# Patient Record
Sex: Female | Born: 1965 | Race: White | Hispanic: No | Smoking: Former smoker
Health system: Southern US, Community
[De-identification: ages and names within clinical notes are randomized; demographics above are authoritative.]

## PROBLEM LIST (undated history)

## (undated) DIAGNOSIS — Z8052 Family history of malignant neoplasm of bladder: Secondary | ICD-10-CM

## (undated) DIAGNOSIS — Z9189 Other specified personal risk factors, not elsewhere classified: Secondary | ICD-10-CM

## (undated) DIAGNOSIS — C801 Malignant (primary) neoplasm, unspecified: Secondary | ICD-10-CM

## (undated) DIAGNOSIS — N900 Mild vulvar dysplasia: Secondary | ICD-10-CM

## (undated) DIAGNOSIS — F329 Major depressive disorder, single episode, unspecified: Secondary | ICD-10-CM

## (undated) DIAGNOSIS — Z806 Family history of leukemia: Secondary | ICD-10-CM

## (undated) DIAGNOSIS — Z808 Family history of malignant neoplasm of other organs or systems: Secondary | ICD-10-CM

## (undated) DIAGNOSIS — M8580 Other specified disorders of bone density and structure, unspecified site: Secondary | ICD-10-CM

## (undated) DIAGNOSIS — F32A Depression, unspecified: Secondary | ICD-10-CM

## (undated) DIAGNOSIS — Z5189 Encounter for other specified aftercare: Secondary | ICD-10-CM

## (undated) DIAGNOSIS — I10 Essential (primary) hypertension: Secondary | ICD-10-CM

## (undated) DIAGNOSIS — A64 Unspecified sexually transmitted disease: Secondary | ICD-10-CM

## (undated) DIAGNOSIS — Z803 Family history of malignant neoplasm of breast: Secondary | ICD-10-CM

## (undated) DIAGNOSIS — Z8042 Family history of malignant neoplasm of prostate: Secondary | ICD-10-CM

## (undated) DIAGNOSIS — N890 Mild vaginal dysplasia: Secondary | ICD-10-CM

## (undated) DIAGNOSIS — Z91B Personal risk factor of exposure to diethylstilbestrol: Secondary | ICD-10-CM

## (undated) DIAGNOSIS — Z800 Family history of malignant neoplasm of digestive organs: Secondary | ICD-10-CM

## (undated) DIAGNOSIS — F988 Other specified behavioral and emotional disorders with onset usually occurring in childhood and adolescence: Secondary | ICD-10-CM

## (undated) DIAGNOSIS — R011 Cardiac murmur, unspecified: Secondary | ICD-10-CM

## (undated) DIAGNOSIS — Z22321 Carrier or suspected carrier of Methicillin susceptible Staphylococcus aureus: Secondary | ICD-10-CM

## (undated) DIAGNOSIS — N871 Moderate cervical dysplasia: Secondary | ICD-10-CM

## (undated) HISTORY — DX: Mild vaginal dysplasia: N89.0

## (undated) HISTORY — DX: Encounter for other specified aftercare: Z51.89

## (undated) HISTORY — DX: Malignant (primary) neoplasm, unspecified: C80.1

## (undated) HISTORY — DX: Personal risk factor of exposure to diethylstilbestrol: Z91.B

## (undated) HISTORY — DX: Family history of malignant neoplasm of other organs or systems: Z80.8

## (undated) HISTORY — DX: Mild vulvar dysplasia: N90.0

## (undated) HISTORY — DX: Family history of malignant neoplasm of digestive organs: Z80.0

## (undated) HISTORY — DX: Carrier or suspected carrier of methicillin susceptible Staphylococcus aureus: Z22.321

## (undated) HISTORY — DX: Major depressive disorder, single episode, unspecified: F32.9

## (undated) HISTORY — DX: Family history of malignant neoplasm of bladder: Z80.52

## (undated) HISTORY — DX: Moderate cervical dysplasia: N87.1

## (undated) HISTORY — DX: Other specified behavioral and emotional disorders with onset usually occurring in childhood and adolescence: F98.8

## (undated) HISTORY — DX: Depression, unspecified: F32.A

## (undated) HISTORY — PX: WRIST SURGERY: SHX841

## (undated) HISTORY — DX: Family history of leukemia: Z80.6

## (undated) HISTORY — DX: Family history of malignant neoplasm of breast: Z80.3

## (undated) HISTORY — DX: Unspecified sexually transmitted disease: A64

## (undated) HISTORY — PX: BURN TREATMENT: SHX158

## (undated) HISTORY — DX: Family history of malignant neoplasm of prostate: Z80.42

## (undated) HISTORY — DX: Cardiac murmur, unspecified: R01.1

## (undated) HISTORY — DX: Other specified disorders of bone density and structure, unspecified site: M85.80

## (undated) HISTORY — DX: Other specified personal risk factors, not elsewhere classified: Z91.89

## (undated) HISTORY — DX: Essential (primary) hypertension: I10

---
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
## 1993-03-06 HISTORY — PX: LAPAROSCOPY FOR ECTOPIC PREGNANCY: SUR765

## 1997-06-27 ENCOUNTER — Observation Stay (HOSPITAL_COMMUNITY): Admission: AD | Admit: 1997-06-27 | Discharge: 1997-06-28 | Payer: Self-pay | Admitting: Obstetrics and Gynecology

## 1997-07-06 ENCOUNTER — Inpatient Hospital Stay (HOSPITAL_COMMUNITY): Admission: AD | Admit: 1997-07-06 | Discharge: 1997-07-06 | Payer: Self-pay | Admitting: Obstetrics and Gynecology

## 1997-07-08 ENCOUNTER — Encounter: Admission: RE | Admit: 1997-07-08 | Discharge: 1997-10-06 | Payer: Self-pay | Admitting: Gynecology

## 1997-07-25 ENCOUNTER — Inpatient Hospital Stay (HOSPITAL_COMMUNITY): Admission: AD | Admit: 1997-07-25 | Discharge: 1997-07-25 | Payer: Self-pay | Admitting: Gynecology

## 1997-08-27 ENCOUNTER — Inpatient Hospital Stay (HOSPITAL_COMMUNITY): Admission: RE | Admit: 1997-08-27 | Discharge: 1997-08-27 | Payer: Self-pay | Admitting: Obstetrics and Gynecology

## 1997-08-28 ENCOUNTER — Inpatient Hospital Stay (HOSPITAL_COMMUNITY): Admission: AD | Admit: 1997-08-28 | Discharge: 1997-08-28 | Payer: Self-pay | Admitting: Gynecology

## 1997-09-09 ENCOUNTER — Inpatient Hospital Stay (HOSPITAL_COMMUNITY): Admission: RE | Admit: 1997-09-09 | Discharge: 1997-09-12 | Payer: Self-pay | Admitting: Obstetrics and Gynecology

## 1997-10-12 ENCOUNTER — Other Ambulatory Visit: Admission: RE | Admit: 1997-10-12 | Discharge: 1997-10-12 | Payer: Self-pay | Admitting: Obstetrics and Gynecology

## 1998-10-11 ENCOUNTER — Other Ambulatory Visit: Admission: RE | Admit: 1998-10-11 | Discharge: 1998-10-11 | Payer: Self-pay | Admitting: Obstetrics and Gynecology

## 1999-04-07 HISTORY — PX: AUGMENTATION MAMMAPLASTY: SUR837

## 1999-10-18 ENCOUNTER — Other Ambulatory Visit: Admission: RE | Admit: 1999-10-18 | Discharge: 1999-10-18 | Payer: Self-pay | Admitting: Obstetrics and Gynecology

## 2000-10-31 ENCOUNTER — Other Ambulatory Visit: Admission: RE | Admit: 2000-10-31 | Discharge: 2000-10-31 | Payer: Self-pay | Admitting: Obstetrics and Gynecology

## 2001-11-20 ENCOUNTER — Other Ambulatory Visit: Admission: RE | Admit: 2001-11-20 | Discharge: 2001-11-20 | Payer: Self-pay | Admitting: Obstetrics and Gynecology

## 2002-12-02 ENCOUNTER — Other Ambulatory Visit: Admission: RE | Admit: 2002-12-02 | Discharge: 2002-12-02 | Payer: Self-pay | Admitting: Obstetrics and Gynecology

## 2002-12-08 ENCOUNTER — Encounter: Admission: RE | Admit: 2002-12-08 | Discharge: 2002-12-08 | Payer: Self-pay | Admitting: Obstetrics and Gynecology

## 2002-12-08 ENCOUNTER — Encounter: Payer: Self-pay | Admitting: Obstetrics and Gynecology

## 2003-12-08 ENCOUNTER — Other Ambulatory Visit: Admission: RE | Admit: 2003-12-08 | Discharge: 2003-12-08 | Payer: Self-pay | Admitting: Obstetrics and Gynecology

## 2003-12-21 ENCOUNTER — Encounter: Admission: RE | Admit: 2003-12-21 | Discharge: 2003-12-21 | Payer: Self-pay | Admitting: Obstetrics and Gynecology

## 2003-12-21 ENCOUNTER — Encounter (INDEPENDENT_AMBULATORY_CARE_PROVIDER_SITE_OTHER): Payer: Self-pay | Admitting: Diagnostic Radiology

## 2003-12-21 ENCOUNTER — Encounter (INDEPENDENT_AMBULATORY_CARE_PROVIDER_SITE_OTHER): Payer: Self-pay | Admitting: *Deleted

## 2003-12-21 DIAGNOSIS — C801 Malignant (primary) neoplasm, unspecified: Secondary | ICD-10-CM

## 2003-12-21 HISTORY — DX: Malignant (primary) neoplasm, unspecified: C80.1

## 2003-12-25 ENCOUNTER — Encounter (HOSPITAL_COMMUNITY): Admission: RE | Admit: 2003-12-25 | Discharge: 2004-03-24 | Payer: Self-pay | Admitting: General Surgery

## 2004-01-05 ENCOUNTER — Encounter: Admission: RE | Admit: 2004-01-05 | Discharge: 2004-01-05 | Payer: Self-pay | Admitting: General Surgery

## 2004-01-07 ENCOUNTER — Encounter (INDEPENDENT_AMBULATORY_CARE_PROVIDER_SITE_OTHER): Payer: Self-pay | Admitting: General Surgery

## 2004-01-07 ENCOUNTER — Ambulatory Visit (HOSPITAL_COMMUNITY): Admission: RE | Admit: 2004-01-07 | Discharge: 2004-01-07 | Payer: Self-pay | Admitting: General Surgery

## 2004-01-07 ENCOUNTER — Encounter (INDEPENDENT_AMBULATORY_CARE_PROVIDER_SITE_OTHER): Payer: Self-pay | Admitting: *Deleted

## 2004-01-07 ENCOUNTER — Ambulatory Visit (HOSPITAL_BASED_OUTPATIENT_CLINIC_OR_DEPARTMENT_OTHER): Admission: RE | Admit: 2004-01-07 | Discharge: 2004-01-07 | Payer: Self-pay | Admitting: General Surgery

## 2004-01-07 HISTORY — PX: MASTECTOMY, PARTIAL: SHX709

## 2004-01-08 ENCOUNTER — Ambulatory Visit: Payer: Self-pay | Admitting: Oncology

## 2004-01-18 ENCOUNTER — Ambulatory Visit: Admission: RE | Admit: 2004-01-18 | Discharge: 2004-03-11 | Payer: Self-pay | Admitting: *Deleted

## 2004-01-20 ENCOUNTER — Ambulatory Visit (HOSPITAL_COMMUNITY): Admission: RE | Admit: 2004-01-20 | Discharge: 2004-01-20 | Payer: Self-pay | Admitting: Oncology

## 2004-01-25 ENCOUNTER — Ambulatory Visit (HOSPITAL_COMMUNITY): Admission: RE | Admit: 2004-01-25 | Discharge: 2004-01-25 | Payer: Self-pay | Admitting: Oncology

## 2004-01-27 ENCOUNTER — Ambulatory Visit (HOSPITAL_COMMUNITY): Admission: RE | Admit: 2004-01-27 | Discharge: 2004-01-27 | Payer: Self-pay | Admitting: Oncology

## 2004-02-04 ENCOUNTER — Ambulatory Visit (HOSPITAL_BASED_OUTPATIENT_CLINIC_OR_DEPARTMENT_OTHER): Admission: RE | Admit: 2004-02-04 | Discharge: 2004-02-04 | Payer: Self-pay | Admitting: General Surgery

## 2004-02-10 ENCOUNTER — Ambulatory Visit: Payer: Self-pay | Admitting: Oncology

## 2004-03-10 ENCOUNTER — Ambulatory Visit: Payer: Self-pay | Admitting: Oncology

## 2004-04-14 ENCOUNTER — Encounter: Admission: RE | Admit: 2004-04-14 | Discharge: 2004-04-14 | Payer: Self-pay | Admitting: Oncology

## 2004-04-18 ENCOUNTER — Ambulatory Visit (HOSPITAL_BASED_OUTPATIENT_CLINIC_OR_DEPARTMENT_OTHER): Admission: RE | Admit: 2004-04-18 | Discharge: 2004-04-18 | Payer: Self-pay | Admitting: General Surgery

## 2004-04-18 ENCOUNTER — Encounter (INDEPENDENT_AMBULATORY_CARE_PROVIDER_SITE_OTHER): Payer: Self-pay | Admitting: Specialist

## 2004-04-18 ENCOUNTER — Ambulatory Visit (HOSPITAL_COMMUNITY): Admission: RE | Admit: 2004-04-18 | Discharge: 2004-04-18 | Payer: Self-pay | Admitting: General Surgery

## 2004-04-22 ENCOUNTER — Ambulatory Visit: Admission: RE | Admit: 2004-04-22 | Discharge: 2004-06-15 | Payer: Self-pay | Admitting: *Deleted

## 2004-04-28 ENCOUNTER — Ambulatory Visit: Payer: Self-pay | Admitting: Oncology

## 2004-05-09 ENCOUNTER — Ambulatory Visit (HOSPITAL_BASED_OUTPATIENT_CLINIC_OR_DEPARTMENT_OTHER): Admission: RE | Admit: 2004-05-09 | Discharge: 2004-05-09 | Payer: Self-pay | Admitting: General Surgery

## 2004-05-09 ENCOUNTER — Encounter (INDEPENDENT_AMBULATORY_CARE_PROVIDER_SITE_OTHER): Payer: Self-pay | Admitting: *Deleted

## 2004-05-09 ENCOUNTER — Ambulatory Visit (HOSPITAL_COMMUNITY): Admission: RE | Admit: 2004-05-09 | Discharge: 2004-05-09 | Payer: Self-pay | Admitting: General Surgery

## 2004-06-29 ENCOUNTER — Ambulatory Visit: Payer: Self-pay | Admitting: Oncology

## 2004-07-04 HISTORY — PX: MASTECTOMY: SHX3

## 2004-07-14 HISTORY — PX: SIMPLE MASTECTOMY: SHX2409

## 2004-08-24 ENCOUNTER — Ambulatory Visit: Payer: Self-pay | Admitting: Oncology

## 2004-10-18 ENCOUNTER — Ambulatory Visit: Payer: Self-pay | Admitting: Oncology

## 2004-12-05 HISTORY — PX: BREAST RECONSTRUCTION: SHX9

## 2004-12-12 ENCOUNTER — Ambulatory Visit (HOSPITAL_COMMUNITY): Admission: RE | Admit: 2004-12-12 | Discharge: 2004-12-12 | Payer: Self-pay | Admitting: Oncology

## 2004-12-14 ENCOUNTER — Ambulatory Visit: Payer: Self-pay | Admitting: Oncology

## 2005-02-03 ENCOUNTER — Other Ambulatory Visit: Admission: RE | Admit: 2005-02-03 | Discharge: 2005-02-03 | Payer: Self-pay | Admitting: Obstetrics and Gynecology

## 2005-02-08 ENCOUNTER — Ambulatory Visit: Payer: Self-pay | Admitting: Oncology

## 2005-04-05 ENCOUNTER — Ambulatory Visit: Payer: Self-pay | Admitting: Oncology

## 2005-05-31 ENCOUNTER — Ambulatory Visit: Payer: Self-pay | Admitting: Oncology

## 2005-07-21 ENCOUNTER — Ambulatory Visit: Payer: Self-pay | Admitting: Oncology

## 2005-07-25 LAB — COMPREHENSIVE METABOLIC PANEL
AST: 22 U/L (ref 0–37)
Alkaline Phosphatase: 72 U/L (ref 39–117)
BUN: 10 mg/dL (ref 6–23)
Calcium: 9.3 mg/dL (ref 8.4–10.5)
Chloride: 101 mEq/L (ref 96–112)
Creatinine, Ser: 0.7 mg/dL (ref 0.4–1.2)
Glucose, Bld: 103 mg/dL — ABNORMAL HIGH (ref 70–99)

## 2005-07-25 LAB — CBC WITH DIFFERENTIAL/PLATELET
Basophils Absolute: 0 10*3/uL (ref 0.0–0.1)
EOS%: 1.9 % (ref 0.0–7.0)
Eosinophils Absolute: 0.1 10*3/uL (ref 0.0–0.5)
HCT: 35.6 % (ref 34.8–46.6)
HGB: 12.2 g/dL (ref 11.6–15.9)
MCH: 31.4 pg (ref 26.0–34.0)
MCV: 91.2 fL (ref 81.0–101.0)
MONO%: 9.7 % (ref 0.0–13.0)
NEUT%: 58.3 % (ref 39.6–76.8)

## 2005-08-21 ENCOUNTER — Encounter: Admission: RE | Admit: 2005-08-21 | Discharge: 2005-08-21 | Payer: Self-pay | Admitting: Oncology

## 2005-09-18 ENCOUNTER — Ambulatory Visit: Payer: Self-pay | Admitting: Oncology

## 2005-11-14 ENCOUNTER — Ambulatory Visit: Payer: Self-pay | Admitting: Oncology

## 2005-11-15 ENCOUNTER — Ambulatory Visit (HOSPITAL_COMMUNITY): Admission: RE | Admit: 2005-11-15 | Discharge: 2005-11-15 | Payer: Self-pay | Admitting: Oncology

## 2005-11-21 ENCOUNTER — Ambulatory Visit (HOSPITAL_COMMUNITY): Admission: RE | Admit: 2005-11-21 | Discharge: 2005-11-21 | Payer: Self-pay | Admitting: Oncology

## 2006-01-09 ENCOUNTER — Ambulatory Visit: Payer: Self-pay | Admitting: Oncology

## 2006-02-01 LAB — CBC WITH DIFFERENTIAL/PLATELET
BASO%: 1.4 % (ref 0.0–2.0)
EOS%: 1.6 % (ref 0.0–7.0)
LYMPH%: 34 % (ref 14.0–48.0)
MCHC: 35.2 g/dL (ref 32.0–36.0)
MONO#: 0.6 10*3/uL (ref 0.1–0.9)
MONO%: 8.1 % (ref 0.0–13.0)
Platelets: 322 10*3/uL (ref 145–400)
RBC: 4.16 10*6/uL (ref 3.70–5.32)
WBC: 7.4 10*3/uL (ref 3.9–10.0)

## 2006-02-01 LAB — COMPREHENSIVE METABOLIC PANEL
ALT: 16 U/L (ref 0–35)
AST: 16 U/L (ref 0–37)
Alkaline Phosphatase: 81 U/L (ref 39–117)
Sodium: 141 mEq/L (ref 135–145)
Total Bilirubin: 0.3 mg/dL (ref 0.3–1.2)
Total Protein: 7.3 g/dL (ref 6.0–8.3)

## 2006-02-23 ENCOUNTER — Other Ambulatory Visit: Admission: RE | Admit: 2006-02-23 | Discharge: 2006-02-23 | Payer: Self-pay | Admitting: Obstetrics and Gynecology

## 2006-03-03 ENCOUNTER — Ambulatory Visit: Payer: Self-pay | Admitting: Oncology

## 2006-03-06 DIAGNOSIS — N871 Moderate cervical dysplasia: Secondary | ICD-10-CM

## 2006-03-06 HISTORY — DX: Moderate cervical dysplasia: N87.1

## 2006-03-06 HISTORY — PX: COLPOSCOPY: SHX161

## 2006-04-06 HISTORY — PX: CERVICAL BIOPSY  W/ LOOP ELECTRODE EXCISION: SUR135

## 2006-04-16 ENCOUNTER — Encounter: Admission: RE | Admit: 2006-04-16 | Discharge: 2006-04-16 | Payer: Self-pay | Admitting: Oncology

## 2006-04-30 ENCOUNTER — Ambulatory Visit: Payer: Self-pay | Admitting: Oncology

## 2006-06-25 ENCOUNTER — Ambulatory Visit: Payer: Self-pay | Admitting: Oncology

## 2006-07-16 ENCOUNTER — Other Ambulatory Visit: Admission: RE | Admit: 2006-07-16 | Discharge: 2006-07-16 | Payer: Self-pay | Admitting: Obstetrics and Gynecology

## 2006-08-14 ENCOUNTER — Ambulatory Visit: Payer: Self-pay | Admitting: Oncology

## 2006-08-16 LAB — COMPREHENSIVE METABOLIC PANEL
Albumin: 4.5 g/dL (ref 3.5–5.2)
Alkaline Phosphatase: 78 U/L (ref 39–117)
BUN: 11 mg/dL (ref 6–23)
CO2: 27 mEq/L (ref 19–32)
Glucose, Bld: 92 mg/dL (ref 70–99)
Total Bilirubin: 0.6 mg/dL (ref 0.3–1.2)

## 2006-08-16 LAB — CBC WITH DIFFERENTIAL/PLATELET
Basophils Absolute: 0 10*3/uL (ref 0.0–0.1)
Eosinophils Absolute: 0 10*3/uL (ref 0.0–0.5)
HCT: 36.7 % (ref 34.8–46.6)
HGB: 12.7 g/dL (ref 11.6–15.9)
LYMPH%: 23.5 % (ref 14.0–48.0)
MCV: 92.3 fL (ref 81.0–101.0)
MONO%: 8 % (ref 0.0–13.0)
NEUT#: 4.4 10*3/uL (ref 1.5–6.5)
Platelets: 246 10*3/uL (ref 145–400)

## 2006-08-16 LAB — CANCER ANTIGEN 27.29: CA 27.29: 26 U/mL (ref 0–39)

## 2006-08-16 LAB — LACTATE DEHYDROGENASE: LDH: 140 U/L (ref 94–250)

## 2006-09-04 ENCOUNTER — Ambulatory Visit (HOSPITAL_COMMUNITY): Admission: RE | Admit: 2006-09-04 | Discharge: 2006-09-04 | Payer: Self-pay | Admitting: Oncology

## 2006-09-04 ENCOUNTER — Encounter: Admission: RE | Admit: 2006-09-04 | Discharge: 2006-09-04 | Payer: Self-pay | Admitting: Oncology

## 2006-10-16 ENCOUNTER — Ambulatory Visit: Payer: Self-pay | Admitting: Oncology

## 2006-11-30 ENCOUNTER — Other Ambulatory Visit: Admission: RE | Admit: 2006-11-30 | Discharge: 2006-11-30 | Payer: Self-pay | Admitting: Obstetrics and Gynecology

## 2006-12-11 ENCOUNTER — Ambulatory Visit: Payer: Self-pay | Admitting: Oncology

## 2007-01-28 ENCOUNTER — Ambulatory Visit: Payer: Self-pay | Admitting: Oncology

## 2007-02-01 LAB — CBC WITH DIFFERENTIAL/PLATELET
Basophils Absolute: 0 10*3/uL (ref 0.0–0.1)
Eosinophils Absolute: 0.1 10*3/uL (ref 0.0–0.5)
HCT: 38.2 % (ref 34.8–46.6)
HGB: 13.5 g/dL (ref 11.6–15.9)
LYMPH%: 42.1 % (ref 14.0–48.0)
MCHC: 35.4 g/dL (ref 32.0–36.0)
MONO#: 0.5 10*3/uL (ref 0.1–0.9)
NEUT#: 2.4 10*3/uL (ref 1.5–6.5)
NEUT%: 44.7 % (ref 39.6–76.8)
Platelets: 214 10*3/uL (ref 145–400)
WBC: 5.3 10*3/uL (ref 3.9–10.0)
lymph#: 2.2 10*3/uL (ref 0.9–3.3)

## 2007-02-01 LAB — LACTATE DEHYDROGENASE: LDH: 142 U/L (ref 94–250)

## 2007-02-01 LAB — COMPREHENSIVE METABOLIC PANEL
ALT: 17 U/L (ref 0–35)
BUN: 13 mg/dL (ref 6–23)
CO2: 24 mEq/L (ref 19–32)
Calcium: 8.6 mg/dL (ref 8.4–10.5)
Chloride: 106 mEq/L (ref 96–112)
Creatinine, Ser: 0.75 mg/dL (ref 0.40–1.20)
Glucose, Bld: 66 mg/dL — ABNORMAL LOW (ref 70–99)
Total Bilirubin: 0.5 mg/dL (ref 0.3–1.2)

## 2007-02-01 LAB — CANCER ANTIGEN 27.29: CA 27.29: 36 U/mL (ref 0–39)

## 2007-02-07 LAB — VITAMIN D PNL(25-HYDRXY+1,25-DIHY)-BLD: Vit D, 25-Hydroxy: 57 ng/mL (ref 30–89)

## 2007-04-03 ENCOUNTER — Ambulatory Visit: Payer: Self-pay | Admitting: Oncology

## 2007-04-05 ENCOUNTER — Other Ambulatory Visit: Admission: RE | Admit: 2007-04-05 | Discharge: 2007-04-05 | Payer: Self-pay | Admitting: Obstetrics and Gynecology

## 2007-05-27 ENCOUNTER — Other Ambulatory Visit: Admission: RE | Admit: 2007-05-27 | Discharge: 2007-05-27 | Payer: Self-pay | Admitting: Obstetrics and Gynecology

## 2007-05-29 ENCOUNTER — Ambulatory Visit: Payer: Self-pay | Admitting: Oncology

## 2007-07-24 ENCOUNTER — Ambulatory Visit: Payer: Self-pay | Admitting: Oncology

## 2007-09-17 ENCOUNTER — Ambulatory Visit: Payer: Self-pay | Admitting: Oncology

## 2007-11-12 ENCOUNTER — Ambulatory Visit: Payer: Self-pay | Admitting: Oncology

## 2008-01-08 ENCOUNTER — Ambulatory Visit: Payer: Self-pay | Admitting: Oncology

## 2008-02-10 LAB — CBC WITH DIFFERENTIAL/PLATELET
BASO%: 0.5 % (ref 0.0–2.0)
HCT: 38 % (ref 34.8–46.6)
LYMPH%: 39.7 % (ref 14.0–48.0)
MCHC: 34.8 g/dL (ref 32.0–36.0)
MCV: 91.3 fL (ref 81.0–101.0)
MONO#: 0.5 10*3/uL (ref 0.1–0.9)
MONO%: 9.2 % (ref 0.0–13.0)
NEUT%: 49.2 % (ref 39.6–76.8)
Platelets: 226 10*3/uL (ref 145–400)
WBC: 5 10*3/uL (ref 3.9–10.0)

## 2008-02-11 LAB — COMPREHENSIVE METABOLIC PANEL
ALT: 16 U/L (ref 0–35)
Alkaline Phosphatase: 76 U/L (ref 39–117)
CO2: 25 mEq/L (ref 19–32)
Creatinine, Ser: 0.9 mg/dL (ref 0.40–1.20)
Glucose, Bld: 71 mg/dL (ref 70–99)
Total Bilirubin: 0.4 mg/dL (ref 0.3–1.2)

## 2008-02-11 LAB — CANCER ANTIGEN 27.29: CA 27.29: 35 U/mL (ref 0–39)

## 2008-02-11 LAB — LACTATE DEHYDROGENASE: LDH: 120 U/L (ref 94–250)

## 2008-03-03 ENCOUNTER — Ambulatory Visit: Payer: Self-pay | Admitting: Oncology

## 2008-03-16 ENCOUNTER — Ambulatory Visit (HOSPITAL_COMMUNITY): Admission: RE | Admit: 2008-03-16 | Discharge: 2008-03-16 | Payer: Self-pay | Admitting: Oncology

## 2008-04-29 ENCOUNTER — Ambulatory Visit: Payer: Self-pay | Admitting: Oncology

## 2008-06-15 ENCOUNTER — Other Ambulatory Visit: Admission: RE | Admit: 2008-06-15 | Discharge: 2008-06-15 | Payer: Self-pay | Admitting: Obstetrics and Gynecology

## 2008-06-25 ENCOUNTER — Ambulatory Visit: Payer: Self-pay | Admitting: Oncology

## 2008-08-20 ENCOUNTER — Ambulatory Visit: Payer: Self-pay | Admitting: Oncology

## 2008-08-24 LAB — CBC WITH DIFFERENTIAL/PLATELET
BASO%: 0.5 % (ref 0.0–2.0)
EOS%: 1.6 % (ref 0.0–7.0)
HCT: 38.2 % (ref 34.8–46.6)
MCH: 33.4 pg (ref 25.1–34.0)
MCHC: 35.9 g/dL (ref 31.5–36.0)
MONO#: 0.5 10*3/uL (ref 0.1–0.9)
RDW: 12.4 % (ref 11.2–14.5)
WBC: 5.3 10*3/uL (ref 3.9–10.3)
lymph#: 2.1 10*3/uL (ref 0.9–3.3)

## 2008-08-25 LAB — COMPREHENSIVE METABOLIC PANEL
ALT: 14 U/L (ref 0–35)
AST: 17 U/L (ref 0–37)
Albumin: 4.4 g/dL (ref 3.5–5.2)
CO2: 27 mEq/L (ref 19–32)
Calcium: 9.3 mg/dL (ref 8.4–10.5)
Chloride: 105 mEq/L (ref 96–112)
Potassium: 3.8 mEq/L (ref 3.5–5.3)
Sodium: 142 mEq/L (ref 135–145)
Total Protein: 7 g/dL (ref 6.0–8.3)

## 2008-08-25 LAB — LACTATE DEHYDROGENASE: LDH: 128 U/L (ref 94–250)

## 2008-09-23 ENCOUNTER — Ambulatory Visit (HOSPITAL_COMMUNITY): Admission: RE | Admit: 2008-09-23 | Discharge: 2008-09-23 | Payer: Self-pay | Admitting: Oncology

## 2008-09-23 ENCOUNTER — Ambulatory Visit: Payer: Self-pay | Admitting: Oncology

## 2008-10-22 ENCOUNTER — Ambulatory Visit: Payer: Self-pay | Admitting: Oncology

## 2008-12-15 ENCOUNTER — Ambulatory Visit: Payer: Self-pay | Admitting: Oncology

## 2009-02-03 ENCOUNTER — Ambulatory Visit: Payer: Self-pay | Admitting: Oncology

## 2009-02-05 LAB — CBC WITH DIFFERENTIAL/PLATELET
Basophils Absolute: 0.1 10*3/uL (ref 0.0–0.1)
EOS%: 0.6 % (ref 0.0–7.0)
Eosinophils Absolute: 0.1 10*3/uL (ref 0.0–0.5)
HCT: 40.4 % (ref 34.8–46.6)
HGB: 13.6 g/dL (ref 11.6–15.9)
MCH: 31.3 pg (ref 25.1–34.0)
MCV: 92.9 fL (ref 79.5–101.0)
MONO%: 6.6 % (ref 0.0–14.0)
NEUT#: 8.2 10*3/uL — ABNORMAL HIGH (ref 1.5–6.5)
NEUT%: 75.9 % (ref 38.4–76.8)

## 2009-02-06 LAB — COMPREHENSIVE METABOLIC PANEL
AST: 15 U/L (ref 0–37)
Albumin: 4.8 g/dL (ref 3.5–5.2)
Alkaline Phosphatase: 80 U/L (ref 39–117)
BUN: 11 mg/dL (ref 6–23)
Calcium: 9.5 mg/dL (ref 8.4–10.5)
Chloride: 104 mEq/L (ref 96–112)
Creatinine, Ser: 0.78 mg/dL (ref 0.40–1.20)
Glucose, Bld: 90 mg/dL (ref 70–99)
Potassium: 3.8 mEq/L (ref 3.5–5.3)

## 2009-02-06 LAB — CANCER ANTIGEN 27.29: CA 27.29: 26 U/mL (ref 0–39)

## 2009-02-06 LAB — VITAMIN D 25 HYDROXY (VIT D DEFICIENCY, FRACTURES): Vit D, 25-Hydroxy: 47 ng/mL (ref 30–89)

## 2009-03-03 ENCOUNTER — Encounter: Admission: RE | Admit: 2009-03-03 | Discharge: 2009-03-03 | Payer: Self-pay | Admitting: Oncology

## 2009-07-06 ENCOUNTER — Ambulatory Visit: Payer: Self-pay | Admitting: Oncology

## 2009-08-05 ENCOUNTER — Ambulatory Visit: Payer: Self-pay | Admitting: Oncology

## 2009-08-05 LAB — CBC WITH DIFFERENTIAL/PLATELET
BASO%: 0.6 % (ref 0.0–2.0)
Eosinophils Absolute: 0.1 10*3/uL (ref 0.0–0.5)
LYMPH%: 29 % (ref 14.0–49.7)
MONO#: 0.6 10*3/uL (ref 0.1–0.9)
NEUT#: 3.7 10*3/uL (ref 1.5–6.5)
Platelets: 268 10*3/uL (ref 145–400)
RBC: 3.97 10*6/uL (ref 3.70–5.45)
WBC: 6.3 10*3/uL (ref 3.9–10.3)
lymph#: 1.8 10*3/uL (ref 0.9–3.3)

## 2009-08-05 LAB — COMPREHENSIVE METABOLIC PANEL
ALT: 16 U/L (ref 0–35)
Albumin: 4.4 g/dL (ref 3.5–5.2)
CO2: 24 mEq/L (ref 19–32)
Calcium: 9.3 mg/dL (ref 8.4–10.5)
Chloride: 103 mEq/L (ref 96–112)
Glucose, Bld: 54 mg/dL — ABNORMAL LOW (ref 70–99)
Potassium: 3.9 mEq/L (ref 3.5–5.3)
Sodium: 139 mEq/L (ref 135–145)
Total Bilirubin: 0.4 mg/dL (ref 0.3–1.2)
Total Protein: 6.8 g/dL (ref 6.0–8.3)

## 2009-08-05 LAB — LACTATE DEHYDROGENASE: LDH: 111 U/L (ref 94–250)

## 2009-08-05 LAB — CANCER ANTIGEN 27.29: CA 27.29: 32 U/mL (ref 0–39)

## 2009-10-18 ENCOUNTER — Ambulatory Visit (HOSPITAL_COMMUNITY): Admission: RE | Admit: 2009-10-18 | Discharge: 2009-10-18 | Payer: Self-pay | Admitting: Oncology

## 2010-02-18 ENCOUNTER — Ambulatory Visit: Payer: Self-pay | Admitting: Oncology

## 2010-03-17 LAB — COMPREHENSIVE METABOLIC PANEL
ALT: 17 U/L (ref 0–35)
AST: 26 U/L (ref 0–37)
Albumin: 4.9 g/dL (ref 3.5–5.2)
Alkaline Phosphatase: 63 U/L (ref 39–117)
BUN: 19 mg/dL (ref 6–23)
CO2: 27 mEq/L (ref 19–32)
Calcium: 9.4 mg/dL (ref 8.4–10.5)
Chloride: 102 mEq/L (ref 96–112)
Creatinine, Ser: 0.82 mg/dL (ref 0.40–1.20)
Glucose, Bld: 85 mg/dL (ref 70–99)
Potassium: 3.8 mEq/L (ref 3.5–5.3)
Sodium: 138 mEq/L (ref 135–145)
Total Bilirubin: 0.5 mg/dL (ref 0.3–1.2)
Total Protein: 7 g/dL (ref 6.0–8.3)

## 2010-03-17 LAB — CBC WITH DIFFERENTIAL/PLATELET
BASO%: 0.5 % (ref 0.0–2.0)
Basophils Absolute: 0 10*3/uL (ref 0.0–0.1)
EOS%: 0.7 % (ref 0.0–7.0)
Eosinophils Absolute: 0.1 10*3/uL (ref 0.0–0.5)
HCT: 38.3 % (ref 34.8–46.6)
HGB: 13.2 g/dL (ref 11.6–15.9)
LYMPH%: 25 % (ref 14.0–49.7)
MCH: 31.7 pg (ref 25.1–34.0)
MCHC: 34.5 g/dL (ref 31.5–36.0)
MCV: 92.1 fL (ref 79.5–101.0)
MONO#: 0.6 10*3/uL (ref 0.1–0.9)
MONO%: 6.8 % (ref 0.0–14.0)
NEUT#: 5.5 10*3/uL (ref 1.5–6.5)
NEUT%: 67 % (ref 38.4–76.8)
Platelets: 293 10*3/uL (ref 145–400)
RBC: 4.16 10*6/uL (ref 3.70–5.45)
RDW: 11.8 % (ref 11.2–14.5)
WBC: 8.2 10*3/uL (ref 3.9–10.3)
lymph#: 2 10*3/uL (ref 0.9–3.3)

## 2010-03-17 LAB — VITAMIN D 25 HYDROXY (VIT D DEFICIENCY, FRACTURES): Vit D, 25-Hydroxy: 48 ng/mL (ref 30–89)

## 2010-03-26 ENCOUNTER — Encounter: Payer: Self-pay | Admitting: Obstetrics and Gynecology

## 2010-03-27 ENCOUNTER — Encounter: Payer: Self-pay | Admitting: Oncology

## 2010-03-28 ENCOUNTER — Encounter: Payer: Self-pay | Admitting: Oncology

## 2010-04-21 ENCOUNTER — Other Ambulatory Visit: Payer: Self-pay | Admitting: Oncology

## 2010-04-21 DIAGNOSIS — Z780 Asymptomatic menopausal state: Secondary | ICD-10-CM

## 2010-04-21 DIAGNOSIS — Z79899 Other long term (current) drug therapy: Secondary | ICD-10-CM

## 2010-07-22 NOTE — Op Note (Signed)
Regina Schultz, Regina Schultz                  ACCOUNT NO.:  [id number]   MEDICAL RECORD NO.:  [id number]          PATIENT TYPE:  AMB   LOCATION:  [facility]                          FACILITY:  [facility]   PHYSICIAN:  Rose Phi. Young, M.D.   DATE OF BIRTH:  [birth year]   DATE OF PROCEDURE:  05/09/2004  DATE OF DISCHARGE:                                 OPERATIVE REPORT   PREOPERATIVE DIAGNOSIS:  Carcinoma of the left breast.   POSTOPERATIVE DIAGNOSIS:  Carcinoma of the left breast.   PROCEDURE:  Reexcision of superior margin of lumpectomy site.   SURGEON:  Dr. Francina Ames.   ANESTHESIA:  General.   DESCRIPTION OF PROCEDURE:  After suitable general anesthesia was induced,  the patient was placed in the supine position with the arms on arm board,  and left breast prepped and draped in the usual fashion.  I marked the  previous lumpectomy site which was at the 12 o'clock position on the left  breast and incised this, and opened up the site. There was no hematoma or  seroma.  Breast tissue was very thin, and the pectoralis major muscle was  just under this with the implant deep to that. I then excised the superior  margin. Hemostasis was obtained with the cautery.   I then infiltrated the incision with 0.25% Marcaine.  I closed it with  interrupted 4-0 nylon mattress sutures.  Dressing applied. The patient was  transferred to the recovery room in satisfactory condition, having tolerated  the procedure well.      PRY/MEDQ  D:  05/09/2004  T:  05/09/2004  Job:  [id number]

## 2010-09-22 ENCOUNTER — Other Ambulatory Visit: Payer: Self-pay | Admitting: Oncology

## 2010-09-22 DIAGNOSIS — Z9013 Acquired absence of bilateral breasts and nipples: Secondary | ICD-10-CM

## 2010-09-22 DIAGNOSIS — Z853 Personal history of malignant neoplasm of breast: Secondary | ICD-10-CM

## 2010-10-19 ENCOUNTER — Other Ambulatory Visit (HOSPITAL_COMMUNITY): Payer: Self-pay

## 2010-11-04 ENCOUNTER — Ambulatory Visit (HOSPITAL_COMMUNITY)
Admission: RE | Admit: 2010-11-04 | Discharge: 2010-11-04 | Disposition: A | Discharge status: Home / Self Care | Payer: BC Managed Care – PPO | Source: Ambulatory Visit | Attending: Oncology | Admitting: Oncology

## 2010-11-04 DIAGNOSIS — Z9010 Acquired absence of unspecified breast and nipple: Secondary | ICD-10-CM | POA: Insufficient documentation

## 2010-11-04 DIAGNOSIS — Z853 Personal history of malignant neoplasm of breast: Secondary | ICD-10-CM

## 2010-11-04 DIAGNOSIS — Z978 Presence of other specified devices: Secondary | ICD-10-CM | POA: Insufficient documentation

## 2010-11-04 DIAGNOSIS — Z9013 Acquired absence of bilateral breasts and nipples: Secondary | ICD-10-CM

## 2010-11-04 MED ORDER — GADOBENATE DIMEGLUMINE 529 MG/ML IV SOLN
15.0000 mL | Freq: Once | INTRAVENOUS | Status: AC | PRN
  Administered 2010-11-04: 15 mL via INTRAVENOUS

## 2010-12-21 ENCOUNTER — Encounter: Payer: Self-pay | Admitting: *Deleted

## 2011-01-17 ENCOUNTER — Telehealth: Payer: Self-pay | Admitting: *Deleted

## 2011-01-17 NOTE — Telephone Encounter (Signed)
It is ok

## 2011-01-17 NOTE — Telephone Encounter (Signed)
Rec. Call from pt needing to R/S her appointment. Pt states that the schedulers are going to give her a Feb. Appt. Pt is inquiring if" it is ok to wait 3 months"  before following up with MD.

## 2011-01-18 ENCOUNTER — Telehealth: Payer: Self-pay | Admitting: Oncology

## 2011-01-18 NOTE — Telephone Encounter (Signed)
pt called and stated that she need to r/s appt to [id number] asked dr. Donnie Coffin and he approved called pt back lmovm to rtn to r/s appts on nov2012 to [phone]

## 2011-01-20 ENCOUNTER — Other Ambulatory Visit: Payer: BC Managed Care – PPO | Admitting: Lab

## 2011-01-23 ENCOUNTER — Telehealth: Payer: Self-pay | Admitting: *Deleted

## 2011-01-23 NOTE — Telephone Encounter (Signed)
Patient needs follow-up appointment by end of December - cannot be delayed until February 2013 per previous Telephone entry, due to research protocol requirements. Patient states she can come most days between 9am and 1pm, beginning next week through December. Needs lab and MD scheduled for same day.

## 2011-01-23 NOTE — Telephone Encounter (Signed)
Erroneous Encounter - Disregard

## 2011-01-23 NOTE — Telephone Encounter (Signed)
Patient appointments rescheduled to 02/16/2011 at 1:00 pm (lab and MD) by Maple Mirza. Left message on patient's cell phone voice mail regarding these appointments.

## 2011-01-27 ENCOUNTER — Ambulatory Visit: Payer: BC Managed Care – PPO | Admitting: Oncology

## 2011-02-16 ENCOUNTER — Other Ambulatory Visit: Payer: Self-pay | Admitting: Oncology

## 2011-02-16 ENCOUNTER — Telehealth: Payer: Self-pay | Admitting: *Deleted

## 2011-02-16 ENCOUNTER — Other Ambulatory Visit (HOSPITAL_BASED_OUTPATIENT_CLINIC_OR_DEPARTMENT_OTHER): Payer: BC Managed Care – PPO | Admitting: Lab

## 2011-02-16 ENCOUNTER — Ambulatory Visit (HOSPITAL_BASED_OUTPATIENT_CLINIC_OR_DEPARTMENT_OTHER): Payer: BC Managed Care – PPO | Admitting: Oncology

## 2011-02-16 VITALS — BP 113/73 | HR 52 | Temp 98.3°F | Ht 63.5 in | Wt 133.7 lb

## 2011-02-16 DIAGNOSIS — Z170 Estrogen receptor positive status [ER+]: Secondary | ICD-10-CM

## 2011-02-16 DIAGNOSIS — C50419 Malignant neoplasm of upper-outer quadrant of unspecified female breast: Secondary | ICD-10-CM

## 2011-02-16 DIAGNOSIS — C50919 Malignant neoplasm of unspecified site of unspecified female breast: Secondary | ICD-10-CM

## 2011-02-16 LAB — CBC WITH DIFFERENTIAL/PLATELET
BASO%: 0.7 % (ref 0.0–2.0)
HCT: 38 % (ref 34.8–46.6)
LYMPH%: 25.8 % (ref 14.0–49.7)
MCH: 32.2 pg (ref 25.1–34.0)
MCHC: 34.4 g/dL (ref 31.5–36.0)
MCV: 93.7 fL (ref 79.5–101.0)
MONO#: 0.5 10*3/uL (ref 0.1–0.9)
MONO%: 6.5 % (ref 0.0–14.0)
NEUT%: 65.8 % (ref 38.4–76.8)
Platelets: 249 10*3/uL (ref 145–400)
RBC: 4.05 10*6/uL (ref 3.70–5.45)

## 2011-02-16 LAB — COMPREHENSIVE METABOLIC PANEL
ALT: 12 U/L (ref 0–35)
Alkaline Phosphatase: 59 U/L (ref 39–117)
CO2: 27 mEq/L (ref 19–32)
Creatinine, Ser: 0.86 mg/dL (ref 0.50–1.10)
Sodium: 138 mEq/L (ref 135–145)
Total Bilirubin: 0.3 mg/dL (ref 0.3–1.2)
Total Protein: 6.7 g/dL (ref 6.0–8.3)

## 2011-02-16 LAB — VITAMIN D 25 HYDROXY (VIT D DEFICIENCY, FRACTURES): Vit D, 25-Hydroxy: 52 ng/mL (ref 30–89)

## 2011-02-16 MED ORDER — TAMOXIFEN CITRATE 20 MG PO TABS: 20.0000 mg | ORAL_TABLET | Freq: Every day | ORAL | Status: AC

## 2011-02-16 MED ORDER — TAMOXIFEN CITRATE 20 MG PO TABS: 20.0000 mg | ORAL_TABLET | Freq: Every day | ORAL | Status: DC

## 2011-02-16 NOTE — Telephone Encounter (Signed)
gave patient appointment for 02-2012 printed out calendar and gave to the patient 

## 2011-02-16 NOTE — Progress Notes (Signed)
Hematology and Oncology Follow Up Visit  Regina Schultz [id number] 12-28-1965 45 y.o. 02/16/2011 2:13 PM PCP  Principle Diagnosis: 45 yo woman with hx of er/pr+ breast cancer, s/p bilateral mrm 11/05, s/p reconstruction 2006. S/p AC x4 , on TEXT study randomized to triptolyne, completed 2010, Tamoxifen x 5 yrs completed in 2011.  Interim History:  There have been no intercurrent illness, hospitalizations or medication changes.  Medications: I have reviewed the patient's current medications.  Allergies: No Known Allergies  Past Medical History, Surgical history, Social history, and Family History were reviewed and updated.  Review of Systems: Constitutional:  Negative for fever, chills, night sweats, anorexia, weight loss, pain. Cardiovascular: no chest pain or dyspnea on exertion Respiratory: no cough, shortness of breath, or wheezing Neurological: no TIA or stroke symptoms Dermatological: negative ENT: negative Skin Gastrointestinal: no abdominal pain, change in bowel habits, or black or bloody stools Genito-Urinary: no dysuria, trouble voiding, or hematuria Hematological and Lymphatic: negative Breast: negative Musculoskeletal: negative Remaining ROS negative.  Physical Exam: Blood pressure 113/73, pulse 52, temperature 98.3 F (36.8 C), height 5' 3.5" (1.613 m), weight 133 lb 11.2 oz (60.646 kg). ECOG: 0 General appearance: alert, cooperative and appears stated age Head: Normocephalic, without obvious abnormality, atraumatic Neck: no adenopathy, no carotid bruit, no JVD, supple, symmetrical, trachea midline and thyroid not enlarged, symmetric, no tenderness/mass/nodules Lymph nodes: Cervical, supraclavicular, and axillary nodes normal. Cardiac : regular rate and rhythm, no murmurs or gallops Pulmonary:clear to auscultation bilaterally and normal percussion bilaterally Breasts: negative, s/p bilateral implants,  Abdomen:soft, non-tender; bowel sounds normal; no masses,  no  organomegaly Extremities negative Neuro: alert, oriented, normal speech, no focal findings or movement disorder noted  Lab Results: Lab Results  Component Value Date   WBC 7.4 02/16/2011   HGB 13.1 02/16/2011   HCT 38.0 02/16/2011   MCV 93.7 02/16/2011   PLT 249 02/16/2011     Chemistry      Component Value Date/Time   NA 138 03/17/2010 1422   K 3.8 03/17/2010 1422   CL 102 03/17/2010 1422   CO2 27 03/17/2010 1422   BUN 19 03/17/2010 1422   CREATININE 0.82 03/17/2010 1422      Component Value Date/Time   CALCIUM 9.4 03/17/2010 1422   ALKPHOS 63 03/17/2010 1422   AST 26 03/17/2010 1422   ALT 17 03/17/2010 1422   BILITOT 0.5 03/17/2010 1422       Radiological Studies: chest X-ray n/a Mammogram n/a Bone density Due 12/12  Impression and Plan: We had along discussin re- extended tamoxifen therapy per recent data from [facility]. She is at increased risk given resumption of menses, young age and + family history. She has agreed to take the tamoxifen. I will see her in 1 yr with repeat dexa this month. Of note she has not been taking her fosamax consistently.  More than 50% of the visit was spent in patient-related counselling   Pierce Crane, MD 12/13/20122:13 PM

## 2011-03-14 ENCOUNTER — Ambulatory Visit
Admission: RE | Admit: 2011-03-14 | Discharge: 2011-03-14 | Disposition: A | Discharge status: Home / Self Care | Payer: BC Managed Care – PPO | Source: Ambulatory Visit | Attending: Oncology | Admitting: Oncology

## 2011-03-14 DIAGNOSIS — Z79899 Other long term (current) drug therapy: Secondary | ICD-10-CM

## 2011-03-14 DIAGNOSIS — Z780 Asymptomatic menopausal state: Secondary | ICD-10-CM

## 2011-04-18 ENCOUNTER — Telehealth: Payer: Self-pay | Admitting: *Deleted

## 2011-04-18 NOTE — Telephone Encounter (Signed)
Discussed Bone Density results with Dr. Donnie Coffin.  Called pt.  She does not need to substitute anything for the fosamax, which she has taken sporadically.  Her bone density is within the expected range for age and is actually a little better in the spine.  She does need to take her calcium and vitamin D and any exercises that she is currently doing.  She appreciated the call back.

## 2011-07-17 ENCOUNTER — Telehealth: Payer: Self-pay | Admitting: *Deleted

## 2011-07-17 NOTE — Telephone Encounter (Signed)
per staff message changed patient appointment to the week of Nov.11th left voice message informing the patient of the new date and time

## 2011-07-26 ENCOUNTER — Telehealth: Payer: Self-pay | Admitting: *Deleted

## 2011-07-26 NOTE — Telephone Encounter (Signed)
Confirmed 09/14/11 genetics appt w/ pt.

## 2011-09-13 ENCOUNTER — Telehealth: Payer: Self-pay | Admitting: *Deleted

## 2011-09-13 NOTE — Telephone Encounter (Signed)
Patient called stating that she is going out of town and needs to reschedule her genetic appt.  Confirmed 10/12/11 appt w/ pt.

## 2011-09-14 ENCOUNTER — Other Ambulatory Visit: Payer: BC Managed Care – PPO | Admitting: Lab

## 2011-09-14 ENCOUNTER — Encounter: Payer: BC Managed Care – PPO | Admitting: Genetic Counselor

## 2011-10-12 ENCOUNTER — Encounter: Payer: Self-pay | Admitting: Genetic Counselor

## 2011-10-12 ENCOUNTER — Other Ambulatory Visit: Payer: BC Managed Care – PPO | Admitting: Lab

## 2011-10-12 ENCOUNTER — Ambulatory Visit (HOSPITAL_BASED_OUTPATIENT_CLINIC_OR_DEPARTMENT_OTHER): Payer: BC Managed Care – PPO | Admitting: Genetic Counselor

## 2011-10-12 ENCOUNTER — Encounter: Payer: Self-pay | Admitting: *Deleted

## 2011-10-12 DIAGNOSIS — Z8052 Family history of malignant neoplasm of bladder: Secondary | ICD-10-CM

## 2011-10-12 DIAGNOSIS — Z808 Family history of malignant neoplasm of other organs or systems: Secondary | ICD-10-CM

## 2011-10-12 DIAGNOSIS — C50919 Malignant neoplasm of unspecified site of unspecified female breast: Secondary | ICD-10-CM

## 2011-10-12 DIAGNOSIS — Z803 Family history of malignant neoplasm of breast: Secondary | ICD-10-CM
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
## 2011-10-12 NOTE — Progress Notes (Signed)
Patient in to clinic today for appointment with genetics counselor. Spoke with patient briefly in lobby and gave patient appointment calendar for next medical oncology visit with lab. Noted that appointments were moved from December to November to align with annual study visits.

## 2011-11-13 ENCOUNTER — Telehealth: Payer: Self-pay | Admitting: Genetic Counselor

## 2011-11-13 NOTE — Telephone Encounter (Signed)
Revealed negative BRCAPlus testing.  BreastNext is proceeding and will take 16 weeks.

## 2011-11-15 ENCOUNTER — Encounter: Payer: Self-pay | Admitting: Genetic Counselor

## 2012-01-12 ENCOUNTER — Telehealth: Payer: Self-pay | Admitting: Genetic Counselor

## 2012-01-12 ENCOUNTER — Encounter: Payer: Self-pay | Admitting: Genetic Counselor

## 2012-01-12 NOTE — Telephone Encounter (Signed)
Revealed Negative BreastNext test results.

## 2012-01-15 ENCOUNTER — Other Ambulatory Visit: Payer: Self-pay | Admitting: *Deleted

## 2012-01-15 ENCOUNTER — Telehealth: Payer: Self-pay | Admitting: *Deleted

## 2012-01-15 DIAGNOSIS — C50919 Malignant neoplasm of unspecified site of unspecified female breast: Secondary | ICD-10-CM

## 2012-01-15 NOTE — Telephone Encounter (Signed)
Left voice message to inform the patient of the new date and time on 01-29-2012 starting at 9:30am

## 2012-01-16 ENCOUNTER — Other Ambulatory Visit: Payer: BC Managed Care – PPO | Admitting: Lab

## 2012-01-16 ENCOUNTER — Ambulatory Visit: Payer: BC Managed Care – PPO | Admitting: Oncology

## 2012-01-29 ENCOUNTER — Ambulatory Visit (HOSPITAL_BASED_OUTPATIENT_CLINIC_OR_DEPARTMENT_OTHER): Payer: BC Managed Care – PPO | Admitting: Oncology

## 2012-01-29 ENCOUNTER — Other Ambulatory Visit (HOSPITAL_BASED_OUTPATIENT_CLINIC_OR_DEPARTMENT_OTHER): Payer: BC Managed Care – PPO | Admitting: Lab

## 2012-01-29 ENCOUNTER — Telehealth: Payer: Self-pay | Admitting: *Deleted

## 2012-01-29 ENCOUNTER — Encounter: Payer: Self-pay | Admitting: *Deleted

## 2012-01-29 VITALS — BP 112/72 | HR 49 | Temp 98.0°F | Resp 20 | Ht 63.5 in | Wt 129.6 lb

## 2012-01-29 DIAGNOSIS — C50919 Malignant neoplasm of unspecified site of unspecified female breast: Secondary | ICD-10-CM

## 2012-01-29 DIAGNOSIS — Z170 Estrogen receptor positive status [ER+]: Secondary | ICD-10-CM

## 2012-01-29 LAB — CBC WITH DIFFERENTIAL/PLATELET
Basophils Absolute: 0.1 10*3/uL (ref 0.0–0.1)
HCT: 39.3 % (ref 34.8–46.6)
HGB: 13.3 g/dL (ref 11.6–15.9)
LYMPH%: 25.3 % (ref 14.0–49.7)
MONO#: 0.6 10*3/uL (ref 0.1–0.9)
NEUT%: 64.4 % (ref 38.4–76.8)
Platelets: 246 10*3/uL (ref 145–400)
WBC: 6.6 10*3/uL (ref 3.9–10.3)
lymph#: 1.7 10*3/uL (ref 0.9–3.3)

## 2012-01-29 LAB — COMPREHENSIVE METABOLIC PANEL (CC13)
ALT: 25 U/L (ref 0–55)
Albumin: 4 g/dL (ref 3.5–5.0)
CO2: 29 mEq/L (ref 22–29)
Calcium: 9.1 mg/dL (ref 8.4–10.4)
Chloride: 102 mEq/L (ref 98–107)
Glucose: 92 mg/dl (ref 70–99)
Potassium: 4 mEq/L (ref 3.5–5.1)
Sodium: 139 mEq/L (ref 136–145)
Total Protein: 6.9 g/dL (ref 6.4–8.3)

## 2012-01-29 LAB — LACTATE DEHYDROGENASE (CC13): LDH: 143 U/L (ref 125–245)

## 2012-01-29 NOTE — Telephone Encounter (Signed)
Gave patient appointment for 01-2013

## 2012-01-29 NOTE — Progress Notes (Signed)
Hematology and Oncology Follow Up Visit  Regina Schultz [id number] 1966/03/04 46 y.o. 01/29/2012 11:10 AM PCP  Principle Diagnosis: 46 yo woman with hx of er/pr+ breast cancer, s/p bilateral mrm 11/05, s/p reconstruction 2006. S/p AC x4 , on TEXT study randomized to triptolyne, completed 2010, Tamoxifen x 5 yrs completed in 2011.Currently back on tamoxifen  Interim History:  There have been no intercurrent illness, hospitalizations or medication changes.she did have a period in July but none  Since. She has done well. She has had genetic testing with an extended panel, all of which were negative.she had an MRI of her chest in 2012, but not this year.  Medications: I have reviewed the patient's current medications.  Allergies: No Known Allergies  Past Medical History, Surgical history, Social history, and Family History were reviewed and updated.  Review of Systems: Constitutional:  Negative for fever, chills, night sweats, anorexia, weight loss, pain. Cardiovascular: no chest pain or dyspnea on exertion Respiratory: no cough, shortness of breath, or wheezing Neurological: no TIA or stroke symptoms Dermatological: negative ENT: negative Skin Gastrointestinal: no abdominal pain, change in bowel habits, or black or bloody stools Genito-Urinary: no dysuria, trouble voiding, or hematuria Hematological and Lymphatic: negative Breast: negative Musculoskeletal: negative Remaining ROS negative.  Physical Exam: Blood pressure 112/72, pulse 49, temperature 98 F (36.7 C), resp. rate 20, height 5' 3.5" (1.613 m), weight 129 lb 9.6 oz (58.786 kg). ECOG: 0 General appearance: alert, cooperative and appears stated age Head: Normocephalic, without obvious abnormality, atraumatic Neck: no adenopathy, no carotid bruit, no JVD, supple, symmetrical, trachea midline and thyroid not enlarged, symmetric, no tenderness/mass/nodules Lymph nodes: Cervical, supraclavicular, and axillary nodes  normal. Cardiac : regular rate and rhythm, no murmurs or gallops Pulmonary:clear to auscultation bilaterally and normal percussion bilaterally Breasts: negative, s/p bilateral implants, no evidence of recurrence. Abdomen:soft, non-tender; bowel sounds normal; no masses,  no organomegaly Extremities negative Neuro: alert, oriented, normal speech, no focal findings or movement disorder noted  Lab Results: Lab Results  Component Value Date   WBC 6.6 01/29/2012   HGB 13.3 01/29/2012   HCT 39.3 01/29/2012   MCV 97.0 01/29/2012   PLT 246 01/29/2012     Chemistry      Component Value Date/Time   NA 139 01/29/2012 0944   NA 138 02/16/2011 1301   NA 138 02/16/2011 1301   K 4.0 01/29/2012 0944   K 4.0 02/16/2011 1301   K 4.0 02/16/2011 1301   CL 102 01/29/2012 0944   CL 102 02/16/2011 1301   CL 102 02/16/2011 1301   CO2 29 01/29/2012 0944   CO2 27 02/16/2011 1301   CO2 27 02/16/2011 1301   BUN 10.0 01/29/2012 0944   BUN 13 02/16/2011 1301   BUN 13 02/16/2011 1301   CREATININE 0.8 01/29/2012 0944   CREATININE 0.86 02/16/2011 1301   CREATININE 0.86 02/16/2011 1301      Component Value Date/Time   CALCIUM 9.1 01/29/2012 0944   CALCIUM 9.2 02/16/2011 1301   CALCIUM 9.2 02/16/2011 1301   ALKPHOS 65 01/29/2012 0944   ALKPHOS 59 02/16/2011 1301   ALKPHOS 59 02/16/2011 1301   AST 19 01/29/2012 0944   AST 17 02/16/2011 1301   AST 17 02/16/2011 1301   ALT 25 01/29/2012 0944   ALT 12 02/16/2011 1301   ALT 12 02/16/2011 1301   BILITOT 0.80 01/29/2012 0944   BILITOT 0.3 02/16/2011 1301   BILITOT 0.3 02/16/2011 1301       Radiological Studies:  chest X-ray n/a Mammogram n/a Bone density Due 12/12- improved   Impression and Plan: Regina Schultz is doing well, i will see herin 1 year . We will likely discontinue MRI screening at this point . She continues to be amenorrheic., and so tamoxifen will continue. i did give her a estimated total time of 10 yrs of hormone therapy which would  be till 2015..  More than 50% of the visit was spent in patient-related counselling   Pierce Crane, MD 11/25/201311:10 AM

## 2012-01-29 NOTE — Progress Notes (Signed)
Patient in to clinic today for routine follow-up appointment. She denies any significant health problems over the past year, including cardiovascular or cerebrovascular events, bone fractures, or grade 3 toxicities of any kind. She has had no hospitalizations in the past year.  Patient is fully active with KPS = 100%. She reports that she did not start taking tamoxifen until mid-January. She continued to have periods, which became heavier, but then stopped having periods, with her LMP around late July. She reports seeing her gynecologist in April, due to heavy periods, and is due for her routine annual exam in January.   Patient states that she has not taken Fosamax for about 3 years now; based on other records including [facility] report, estimated stop date of mid-January 2011.   Patient is aware that she is expected to continue annual follow-up visits, noting that breast imaging is not required per protocol but will be performed at MD's discretion.

## 2012-02-09 ENCOUNTER — Other Ambulatory Visit: Payer: BC Managed Care – PPO | Admitting: Lab

## 2012-02-16 ENCOUNTER — Ambulatory Visit: Payer: BC Managed Care – PPO | Admitting: Oncology

## 2012-02-27 ENCOUNTER — Other Ambulatory Visit: Payer: Self-pay | Admitting: *Deleted

## 2012-02-27 DIAGNOSIS — C50919 Malignant neoplasm of unspecified site of unspecified female breast: Secondary | ICD-10-CM

## 2012-02-27 MED ORDER — TAMOXIFEN CITRATE 20 MG PO TABS: 20.0000 mg | ORAL_TABLET | Freq: Every day | ORAL | Status: DC

## 2012-05-23 ENCOUNTER — Telehealth: Payer: Self-pay | Admitting: Obstetrics and Gynecology

## 2012-05-24 NOTE — Telephone Encounter (Signed)
Need her paper chart.  Thanks.

## 2012-05-25 ENCOUNTER — Encounter: Payer: Self-pay | Admitting: Oncology

## 2012-05-25 ENCOUNTER — Telehealth: Payer: Self-pay | Admitting: Oncology

## 2012-05-25 NOTE — Telephone Encounter (Signed)
LMONVM ADVIISNG THE PT OF HER REASSIGMENT APPT FROM DR RUBIN TO DR Darnelle Catalan WITH A F/U LETTER AND CALENDAR IN THE MAIL

## 2012-05-31 ENCOUNTER — Institutional Professional Consult (permissible substitution): Payer: Self-pay | Admitting: Obstetrics and Gynecology

## 2012-06-03 ENCOUNTER — Encounter: Payer: Self-pay | Admitting: Obstetrics and Gynecology

## 2012-06-27 NOTE — Progress Notes (Signed)
This encounter was created in error - please disregard.

## 2012-09-05 ENCOUNTER — Telehealth: Payer: Self-pay | Admitting: *Deleted

## 2012-09-05 NOTE — Telephone Encounter (Signed)
Left vm for pt to call to r/s f/u with Norina Buzzard, NP

## 2012-09-29 ENCOUNTER — Other Ambulatory Visit (HOSPITAL_COMMUNITY): Payer: Self-pay | Admitting: Oncology

## 2012-09-30 ENCOUNTER — Telehealth: Payer: Self-pay | Admitting: *Deleted

## 2012-09-30 NOTE — Telephone Encounter (Signed)
Left vm for pt to return call to get established earlier in Dr. Darrall Dears practice.  Will mail out letter.

## 2012-10-09 ENCOUNTER — Telehealth: Payer: Self-pay | Admitting: *Deleted

## 2012-10-09 NOTE — Telephone Encounter (Signed)
Pt received our letter about rescheduling her appt w/ Annice Pih and Dr. Darnelle Catalan and I confirmed 10/24/12 appt w/ pt.  Informed her that we would do labs after visit or on another day and she was fine with that.  Cancelled 11/25 lab appt.  Mailed calendar to pt.

## 2012-10-09 NOTE — Telephone Encounter (Signed)
Pt just looked at calendar and cannot come on 8/21 - rescheduled and confirmed 8/26/14h appt w/ pt.  Mailed new calendar to pt.

## 2012-10-10 ENCOUNTER — Telehealth: Payer: Self-pay | Admitting: *Deleted

## 2012-10-10 NOTE — Telephone Encounter (Signed)
10/10/2012 Left message on patient's home answering machine to inquire if she had received and reviewed the patient letter regarding the research study she participated in ([employer] [facility] 25-02 TEXT). Requested that patient return my call at [phone]. Cindy S. Clelia Croft BSN, RN, CCRP 10/10/2012 11:37 AM

## 2012-10-12 ENCOUNTER — Other Ambulatory Visit: Payer: Self-pay | Admitting: Obstetrics and Gynecology

## 2012-10-14 NOTE — Telephone Encounter (Signed)
10/14/2012 Left message on patient's answering machine to contact me regarding the research study letter. Also noted that I have learned that appointment with Dr. Darrall Dears PA has been scheduled for August, however, patient will also need to be seen in November, therefore would like to discuss this further with the patient. Cindy S. Clelia Croft BSN, RN, CCRP 10/14/2012 12:56 PM

## 2012-10-14 NOTE — Telephone Encounter (Signed)
Last AEX 13/14 No rx was given; upcoming AEX 03/21/13 #30/5 rfs sent to last pt. Until next AEX.

## 2012-10-24 ENCOUNTER — Ambulatory Visit: Payer: BC Managed Care – PPO | Admitting: Family

## 2012-10-29 ENCOUNTER — Ambulatory Visit (HOSPITAL_BASED_OUTPATIENT_CLINIC_OR_DEPARTMENT_OTHER): Payer: BC Managed Care – PPO | Admitting: Family

## 2012-10-29 ENCOUNTER — Encounter: Payer: Self-pay | Admitting: *Deleted

## 2012-10-29 ENCOUNTER — Encounter: Payer: Self-pay | Admitting: Family

## 2012-10-29 VITALS — BP 106/71 | HR 61 | Temp 98.7°F | Resp 20 | Ht 63.5 in | Wt 126.4 lb

## 2012-10-29 DIAGNOSIS — Z853 Personal history of malignant neoplasm of breast: Secondary | ICD-10-CM

## 2012-10-29 NOTE — Patient Instructions (Addendum)
Please contact us at [phone] if you have any questions or concerns.  Please continue to do well and enjoy life!!!  Get plenty of rest, drink plenty of water, exercise daily, eat a balanced diet.     
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
## 2012-10-31 ENCOUNTER — Encounter: Payer: Self-pay | Admitting: Family

## 2012-10-31 ENCOUNTER — Telehealth: Payer: Self-pay | Admitting: Oncology

## 2012-10-31 NOTE — Telephone Encounter (Signed)
, °

## 2012-11-08 NOTE — Progress Notes (Signed)
Patient in to clinic on 10/29/2012 for interim visit to review results from the [facility] 25-02 TEXT study, as mailed to the patient on July 24th. Results reviewed today with patient. Per Dr. Darnelle Catalan, there will be no changes in patient therapy at this time, and she will continue on current therapy with tamoxifen with regular follow-up per protocol. Note that patient's care has been transferred to Dr. Darnelle Catalan from Dr. Donnie Coffin. Plans made for patient to return for regular annual scheduled follow-up in November or December of this year. Patient is in agreement with this plan. Cindy S. Clelia Croft BSN, RN, CCRP 11/08/2012 1:42 PM

## 2013-01-28 ENCOUNTER — Ambulatory Visit: Payer: BC Managed Care – PPO | Admitting: Oncology

## 2013-01-28 ENCOUNTER — Other Ambulatory Visit: Payer: BC Managed Care – PPO | Admitting: Lab

## 2013-01-28 ENCOUNTER — Ambulatory Visit: Payer: BC Managed Care – PPO | Admitting: Family

## 2013-02-20 ENCOUNTER — Encounter: Payer: Self-pay | Admitting: Physician Assistant

## 2013-02-20 ENCOUNTER — Encounter: Payer: BC Managed Care – PPO | Admitting: *Deleted

## 2013-02-20 ENCOUNTER — Other Ambulatory Visit (HOSPITAL_BASED_OUTPATIENT_CLINIC_OR_DEPARTMENT_OTHER): Payer: BC Managed Care – PPO

## 2013-02-20 ENCOUNTER — Telehealth: Payer: Self-pay | Admitting: Oncology

## 2013-02-20 ENCOUNTER — Ambulatory Visit (HOSPITAL_BASED_OUTPATIENT_CLINIC_OR_DEPARTMENT_OTHER): Payer: BC Managed Care – PPO | Admitting: Physician Assistant

## 2013-02-20 ENCOUNTER — Encounter (INDEPENDENT_AMBULATORY_CARE_PROVIDER_SITE_OTHER): Payer: Self-pay

## 2013-02-20 VITALS — BP 123/76 | HR 62 | Temp 98.7°F | Resp 20 | Ht 63.5 in | Wt 123.9 lb

## 2013-02-20 DIAGNOSIS — C50919 Malignant neoplasm of unspecified site of unspecified female breast: Secondary | ICD-10-CM

## 2013-02-20 DIAGNOSIS — Z853 Personal history of malignant neoplasm of breast: Secondary | ICD-10-CM

## 2013-02-20 DIAGNOSIS — Z780 Asymptomatic menopausal state: Secondary | ICD-10-CM

## 2013-02-20 DIAGNOSIS — Z9013 Acquired absence of bilateral breasts and nipples: Secondary | ICD-10-CM

## 2013-02-20 LAB — CBC WITH DIFFERENTIAL/PLATELET
BASO%: 0.8 % (ref 0.0–2.0)
LYMPH%: 21.3 % (ref 14.0–49.7)
MCHC: 34.1 g/dL (ref 31.5–36.0)
MONO#: 0.6 10*3/uL (ref 0.1–0.9)
Platelets: 264 10*3/uL (ref 145–400)
RBC: 4.48 10*6/uL (ref 3.70–5.45)
RDW: 12.4 % (ref 11.2–14.5)
WBC: 7.5 10*3/uL (ref 3.9–10.3)
lymph#: 1.6 10*3/uL (ref 0.9–3.3)

## 2013-02-20 LAB — COMPREHENSIVE METABOLIC PANEL (CC13)
ALT: 17 U/L (ref 0–55)
CO2: 26 mEq/L (ref 22–29)
Calcium: 9.1 mg/dL (ref 8.4–10.4)
Potassium: 3.9 mEq/L (ref 3.5–5.1)
Sodium: 140 mEq/L (ref 136–145)
Total Bilirubin: 0.28 mg/dL (ref 0.20–1.20)
Total Protein: 7.7 g/dL (ref 6.4–8.3)

## 2013-02-20 LAB — LACTATE DEHYDROGENASE (CC13): LDH: 157 U/L (ref 125–245)

## 2013-02-20 MED ORDER — TAMOXIFEN CITRATE 20 MG PO TABS: 20.0000 mg | ORAL_TABLET | Freq: Every day | ORAL | Status: DC

## 2013-02-20 NOTE — Telephone Encounter (Signed)
, °

## 2013-02-20 NOTE — Progress Notes (Signed)
02/20/2013 Patient in to clinic today for evaluation at Month 108 on study. See patient interview questionnaire and provider note for additional details. Patient is aware that her next follow-up visit is expected to occur in November 2015. Cindy S. Clelia Croft BSN, RN, CCRP 02/20/2013 2:22 PM
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
## 2013-02-21 LAB — VITAMIN D 25 HYDROXY (VIT D DEFICIENCY, FRACTURES): Vit D, 25-Hydroxy: 47 ng/mL (ref 30–89)

## 2013-03-06 DIAGNOSIS — N900 Mild vulvar dysplasia: Secondary | ICD-10-CM

## 2013-03-06 DIAGNOSIS — N890 Mild vaginal dysplasia: Secondary | ICD-10-CM

## 2013-03-06 HISTORY — DX: Mild vulvar dysplasia: N90.0

## 2013-03-06 HISTORY — DX: Mild vaginal dysplasia: N89.0

## 2013-03-20 ENCOUNTER — Encounter: Payer: Self-pay | Admitting: *Deleted

## 2013-03-20 ENCOUNTER — Ambulatory Visit (HOSPITAL_COMMUNITY)
Admission: RE | Admit: 2013-03-20 | Discharge: 2013-03-20 | Disposition: A | Discharge status: Home / Self Care | Payer: BC Managed Care – PPO | Source: Ambulatory Visit | Attending: Physician Assistant | Admitting: Physician Assistant

## 2013-03-20 DIAGNOSIS — Z9013 Acquired absence of bilateral breasts and nipples: Secondary | ICD-10-CM

## 2013-03-20 DIAGNOSIS — Z853 Personal history of malignant neoplasm of breast: Secondary | ICD-10-CM

## 2013-03-20 DIAGNOSIS — Z978 Presence of other specified devices: Secondary | ICD-10-CM | POA: Insufficient documentation

## 2013-03-20 DIAGNOSIS — Z09 Encounter for follow-up examination after completed treatment for conditions other than malignant neoplasm: Secondary | ICD-10-CM | POA: Insufficient documentation

## 2013-03-20 MED ORDER — GADOBENATE DIMEGLUMINE 529 MG/ML IV SOLN
11.0000 mL | Freq: Once | INTRAVENOUS | Status: AC | PRN
  Administered 2013-03-20: 11 mL via INTRAVENOUS

## 2013-03-21 ENCOUNTER — Ambulatory Visit: Payer: Self-pay | Admitting: Obstetrics and Gynecology

## 2013-03-25 ENCOUNTER — Other Ambulatory Visit: Payer: BC Managed Care – PPO

## 2013-03-26 ENCOUNTER — Other Ambulatory Visit: Payer: BC Managed Care – PPO

## 2013-04-08 ENCOUNTER — Other Ambulatory Visit: Payer: Self-pay | Admitting: *Deleted

## 2013-04-08 NOTE — Telephone Encounter (Signed)
eScribe request from [facility] for refill on CITALOPRAM Last filled - 05/27/12, #30 THRU 04/05/13 Last AEX - 03/08/12 Next AEX - 04/28/13 Please advise refills.

## 2013-04-09 ENCOUNTER — Telehealth: Payer: Self-pay | Admitting: Gynecology

## 2013-04-09 MED ORDER — CITALOPRAM HYDROBROMIDE 20 MG PO TABS: 40.0000 mg | ORAL_TABLET | Freq: Every day | ORAL | Status: DC

## 2013-04-09 NOTE — Telephone Encounter (Signed)
Spoke with patient, advised rx was sent very early this morning. Patient has annual exam scheduled with Dr. Quincy Simmonds.   Patient verbalized understanding.  Routing to provider for final review. Patient agreeable to disposition. Will close encounter

## 2013-04-09 NOTE — Telephone Encounter (Signed)
Rx for Citalopram was sent 04/08/13 and signed by Dr. Quincy Simmonds.  Message left to return call to [place] at [phone].

## 2013-04-09 NOTE — Telephone Encounter (Signed)
Thank you for communicating with our patient.

## 2013-04-09 NOTE — Telephone Encounter (Signed)
Patient returning Tracy's call. °

## 2013-04-09 NOTE — Telephone Encounter (Signed)
Prescription for Citalopram was denied by our office. Patient would like to talk to a nurse.

## 2013-04-28 ENCOUNTER — Encounter: Payer: Self-pay | Admitting: Obstetrics and Gynecology

## 2013-04-28 ENCOUNTER — Ambulatory Visit (INDEPENDENT_AMBULATORY_CARE_PROVIDER_SITE_OTHER): Payer: BC Managed Care – PPO | Admitting: Obstetrics and Gynecology

## 2013-04-28 VITALS — BP 120/74 | HR 68 | Ht 63.5 in | Wt 128.5 lb

## 2013-04-28 DIAGNOSIS — Z01419 Encounter for gynecological examination (general) (routine) without abnormal findings: Secondary | ICD-10-CM

## 2013-04-28 DIAGNOSIS — N390 Urinary tract infection, site not specified: Secondary | ICD-10-CM

## 2013-04-28 DIAGNOSIS — Z113 Encounter for screening for infections with a predominantly sexual mode of transmission: Secondary | ICD-10-CM

## 2013-04-28 LAB — STD PANEL
HIV: NONREACTIVE
Hepatitis B Surface Ag: NEGATIVE

## 2013-04-28 LAB — LIPID PANEL
CHOL/HDL RATIO: 2.9 ratio
CHOLESTEROL: 160 mg/dL (ref 0–200)
HDL: 55 mg/dL (ref >39)
LDL Cholesterol: 84 mg/dL (ref 0–99)
TRIGLYCERIDES: 105 mg/dL (ref <150)
VLDL: 21 mg/dL (ref 0–40)

## 2013-04-28 LAB — HEPATITIS C ANTIBODY: HCV Ab: NEGATIVE

## 2013-04-28 MED ORDER — SULFAMETHOXAZOLE-TRIMETHOPRIM 800-160 MG PO TABS: 1.0000 | ORAL_TABLET | Freq: Two times a day (BID) | ORAL | Status: DC
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
## 2013-04-28 NOTE — Patient Instructions (Signed)

## 2013-04-29 LAB — GC/CHLAMYDIA PROBE AMP, URINE
CHLAMYDIA, SWAB/URINE, PCR: NEGATIVE
GC PROBE AMP, URINE: NEGATIVE

## 2013-04-30 LAB — CULTURE, URINE COMPREHENSIVE: Colony Count: >=100000

## 2013-04-30 LAB — IPS PAP TEST WITH HPV

## 2013-05-02 ENCOUNTER — Other Ambulatory Visit: Payer: Self-pay | Admitting: Obstetrics and Gynecology

## 2013-05-02 MED ORDER — METRONIDAZOLE 500 MG PO TABS: ORAL_TABLET | ORAL | Status: DC

## 2013-05-05 ENCOUNTER — Telehealth: Payer: Self-pay | Admitting: Emergency Medicine

## 2013-05-05 NOTE — Telephone Encounter (Signed)
Spoke with patient and message from Dr. Quincy Simmonds given. She will follow up prn. She states she is feeling much improved.  Routing to provider for final review. Patient agreeable to disposition. Will close encounter

## 2013-05-05 NOTE — Telephone Encounter (Signed)
Message left to return call to Regina Schultz at [phone].    

## 2013-05-05 NOTE — Telephone Encounter (Signed)
Message copied by Michele Mcalpine on Mon May 05, 2013  8:58 AM ------      Message from: [place], [place]      Created: Fri May 02, 2013 12:53 PM       Olivia Mackie,             Please inform patient of urine culture showing E Coli.  I placed her on Bactrim, which is a good choice for this organism.            Her pap showed nor abnormal cells, but it did show bacterial vaginosis, which may be partially responsible for some of the odor the patient noted when urinating.      I will send an Rx to her pharmacy for Flagyl 500 mg po bid for 7 days to treat this bacterial imbalance of the vagina.      She may not drink ETOH while taking the Flagyl.            CC - Marisa Sprinkles ------

## 2013-05-30 ENCOUNTER — Other Ambulatory Visit: Payer: Self-pay | Admitting: *Deleted

## 2013-05-30 MED ORDER — CITALOPRAM HYDROBROMIDE 20 MG PO TABS: 40.0000 mg | ORAL_TABLET | Freq: Every day | ORAL | Status: DC

## 2013-05-30 NOTE — Telephone Encounter (Signed)
Please pull paper chart.  

## 2013-05-30 NOTE — Telephone Encounter (Signed)
Last AEX 04/28/2013 Last refill 04/08/2013 #60/0 refills Next appt 05/01/2013  Please approve or deny Rx.

## 2013-06-02 ENCOUNTER — Telehealth: Payer: Self-pay | Admitting: *Deleted

## 2013-06-02 NOTE — Telephone Encounter (Signed)
Patient called and left message that she has had a rash on left breast for approx 3 weeks and wonders if she should be concerned. Attempted to call patient back, no answer, left detailed message that she needs to call back to discuss this in further detail before deciding if she should be concerned.

## 2013-06-03 ENCOUNTER — Telehealth: Payer: Self-pay | Admitting: *Deleted

## 2013-06-03 NOTE — Telephone Encounter (Signed)
Returned call to pt. Pt called earlier about complaints of itching to left breast x 3 weeks. Pt denies any redness, swelling or pain. Pt has not changed detergents or soaps in the last few weeks. Pt states the itching is throughout the day.  Advised pt to use an emollient cream or lotion like Eucerin or Aquaphor, but pt said she uses Cetaphil lotion. I will consult with PA-C about this matter and call pt back. Message to be forwarded to [employer], PA-C.

## 2013-06-04 ENCOUNTER — Telehealth: Payer: Self-pay | Admitting: *Deleted

## 2013-06-04 ENCOUNTER — Telehealth: Payer: Self-pay | Admitting: Oncology

## 2013-06-04 NOTE — Telephone Encounter (Signed)
F/U from conversation I had with pt yesterday about complaints of itching to her left breast. I asked pt did the itching seemed like it was on the surface or deeper like under the skin. Pt states the itching is under the skin, but denies pain. Reassured pt that Breast MRI was normal 2 mos ago. I suggested pt follow-up with plastic surgeon per Micah Flesher, PA-C. Pt agreed and said she will call their office today to make an appt. I told her to call us if condition worsens or continues and we'll see her sooner than her scheduled appt in Nov. Message to be forwarded to [employer], PA-C.

## 2013-06-04 NOTE — Telephone Encounter (Signed)
cld pt to adv per Dr Magrinat to chge appt for Lab to 8:45. pt understood °

## 2013-06-12 ENCOUNTER — Telehealth: Payer: Self-pay | Admitting: *Deleted

## 2013-06-12 ENCOUNTER — Other Ambulatory Visit: Payer: Self-pay | Admitting: Obstetrics and Gynecology

## 2013-06-12 MED ORDER — CLONAZEPAM 0.5 MG PO TABS: ORAL_TABLET | ORAL | Status: DC

## 2013-06-12 NOTE — Telephone Encounter (Signed)
I prescribed Clonazepam 0.5 mg po q hs prn, # 30 tabs with no refills. Sent to pharmacy.

## 2013-06-12 NOTE — Telephone Encounter (Signed)
Incoming fax from [facility] requesting Clonazepam 0.5 mg tablet refill.  Last [facility] 04/2013  Last refill 03/2012 #30. - By Dr. Joan Flores Next appt 04/2014  Please approve or deny Rx. Paper Chart in your door.

## 2013-06-17 ENCOUNTER — Telehealth: Payer: Self-pay | Admitting: Obstetrics and Gynecology

## 2013-06-17 NOTE — Telephone Encounter (Signed)
Pt wants to talk with the nurse no information given. °

## 2013-06-17 NOTE — Telephone Encounter (Signed)
Spoke with patient. Patient states that she was last seen in February by Dr.Silva and was treated for a bacterial infection. Patient was given Bactrim for 3 days. Patient states that she now feels the same symptoms have returned. Patient states that "There is an odor when I use the bathroom with a little bit of discharge but not much." Patient states that odor is prominent when having intercourse. Denies itching, burning, pain, and fevers. Would like to know if Dr.Silva can call in another prescription for Bactrim. Advised would check with Dr.Silva for further instructions about medication or office visit to determine proper treatment. Patient agreeable.  Dr.Silva, okay to order Bactrim or would you like patient to come in for office visit to verify bacterial infection?

## 2013-06-17 NOTE — Telephone Encounter (Signed)
Spoke with patient. Patient agreeable to make an appointment at this time. Patient requesting earliest appointment available with Dr.Silva. Appointment made for tomorrow at 2:30 with Dr.Silva. Patient agreeable to date and time.  Routing to provider for final review. Patient agreeable to disposition. Will close encounter

## 2013-06-17 NOTE — Telephone Encounter (Signed)
Please have the patient make an appointment for evaluation. I will be happy to see her but I am not in the office today.

## 2013-06-17 NOTE — Telephone Encounter (Signed)
Left message to call Heaven Meeker at [phone]. 

## 2013-06-18 ENCOUNTER — Encounter: Payer: Self-pay | Admitting: Obstetrics and Gynecology

## 2013-06-18 ENCOUNTER — Ambulatory Visit: Payer: BC Managed Care – PPO | Admitting: Obstetrics and Gynecology

## 2013-06-18 NOTE — Telephone Encounter (Signed)
Open in error

## 2013-06-19 ENCOUNTER — Encounter: Payer: Self-pay | Admitting: Nurse Practitioner

## 2013-06-19 ENCOUNTER — Ambulatory Visit (INDEPENDENT_AMBULATORY_CARE_PROVIDER_SITE_OTHER): Payer: 59 | Admitting: Nurse Practitioner

## 2013-06-19 VITALS — BP 124/76 | HR 64 | Ht 63.5 in | Wt 126.0 lb

## 2013-06-19 DIAGNOSIS — R82998 Other abnormal findings in urine: Secondary | ICD-10-CM

## 2013-06-19 DIAGNOSIS — B9689 Other specified bacterial agents as the cause of diseases classified elsewhere: Secondary | ICD-10-CM

## 2013-06-19 DIAGNOSIS — N760 Acute vaginitis: Secondary | ICD-10-CM

## 2013-06-19 DIAGNOSIS — R8290 Unspecified abnormal findings in urine: Secondary | ICD-10-CM

## 2013-06-19 DIAGNOSIS — A499 Bacterial infection, unspecified: Secondary | ICD-10-CM

## 2013-06-19 MED ORDER — METRONIDAZOLE 0.75 % VA GEL: 1.0000 | Freq: Every day | VAGINAL | Status: DC

## 2013-06-19 NOTE — Progress Notes (Signed)
Subjective:     Patient ID: Regina Schultz, female   DOB: [birth year], 48 y.o.   MRN: [id number]  Urinary Tract Infection  Pertinent negatives include no chills, flank pain, frequency, hematuria, nausea, urgency or vomiting.   This 48 yo DW Fe who has a strong urine odor for past several weeks.  She states sometimes the odor is there during the day but more noted after SA and first thing in the am.  She also notes same odor after SA not associated with urination.  She was treated for BV in February and for E.Coli UTI with Bactrim at the same time.  She has a new partner for about a year.   Just became SA since October.  She did have STD's done at [facility] 04/28/13 that were negative and HR HPV and pap were normal.  Review of Systems  Constitutional: Negative for fever, chills and fatigue.  Respiratory: Negative.   Cardiovascular: Negative.   Gastrointestinal: Negative for nausea, vomiting, abdominal pain, diarrhea, constipation and blood in stool.  Genitourinary: Positive for vaginal discharge. Negative for dysuria, urgency, frequency, hematuria, flank pain, pelvic pain and dyspareunia.  Musculoskeletal: Negative for back pain and myalgias.  Neurological: Negative.   Psychiatric/Behavioral: Negative.        Objective:   Physical Exam  Constitutional: She is oriented to person, place, and time. She appears well-developed and well-nourished.  Abdominal: Soft. She exhibits no distension. There is no tenderness. There is no guarding.  Genitourinary:     No external lesions.  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  Very thin white vaginal discharge.  Wet Prep:  PH: 5.5; NSS: + clue; KOH: negative yeast. No pain at the urethra.  Neurological: She is alert and oriented to person, place, and time.  Psychiatric: She has a normal mood and affect. Her behavior is normal. Judgment and thought content normal.       Assessment:     Bacterial vaginitis R/O UTI Condyloma    Plan:     Follow with urine culture - did not treat Will start her on Metrogel Vaginal cream for 5 days She will return in a week to follow with Dr. Quincy Simmonds. - not sure about treatment with [place] since precaution with immunosuppressed patients.  She is not on chemotherapy but does take Tamoxifen. She was quite tearful and upset at diagnosis and a lot of time in discussion was spent.

## 2013-06-19 NOTE — Patient Instructions (Signed)
Genital Warts Genital warts are a sexually transmitted infection. They may appear as small bumps on the tissues of the genital area. CAUSES  Genital warts are caused by a virus called human papillomavirus (HPV). HPV is the most common sexually transmitted disease (STD) and infection of the sex organs. This infection is spread by having unprotected sex with an infected person. It can be spread by vaginal, anal, and oral sex. Many people do not know they are infected. They may be infected for years without problems. However, even if they do not have problems, they can unknowingly pass the infection to their sexual partners. SYMPTOMS   Itching and irritation in the genital area.  Warts that bleed.  Painful sexual intercourse. DIAGNOSIS  Warts are usually recognized with the naked eye on the vagina, vulva, perineum, anus, and rectum. Certain tests can also diagnose genital warts, such as:  A Pap test.  A tissue sample (biopsy) exam.  Colposcopy. A magnifying tool is used to examine the vagina and cervix. The HPV cells will change color when certain solutions are used. TREATMENT  Warts can be removed by:  Applying certain chemicals, such as cantharidin or podophyllin.  Liquid nitrogen freezing (cryotherapy).  Immunotherapy with candida or trichophyton injections.  Laser treatment.  Burning with an electrified probe (electrocautery).  Interferon injections.  Surgery. PREVENTION  HPV vaccination can help prevent HPV infections that cause genital warts and that cause cancer of the cervix. It is recommended that the vaccination be given to people between the ages 54 to 68 years old. The vaccine might not work as well or might not work at all if you already have HPV. It should not be given to pregnant women. HOME CARE INSTRUCTIONS   It is important to follow your caregiver's instructions. The warts will not go away without treatment. Repeat treatments are often needed to get rid of warts.  Even after it appears that the warts are gone, the normal tissue underneath often remains infected.  Do not try to treat genital warts with medicine used to treat hand warts. This type of medicine is strong and can burn the skin in the genital area, causing more damage.  Tell your past and current sexual partner(s) that you have genital warts. They may be infected also and need treatment.  Avoid sexual contact while being treated.  Do not touch or scratch the warts. The infection may spread to other parts of your body.  Women with genital warts should have a cervical cancer check (Pap test) at least once a year. This type of cancer is slow-growing and can be cured if found early. Chances of developing cervical cancer are increased with HPV.  Inform your obstetrician about your warts in the event of pregnancy. This virus can be passed to the baby's respiratory tract. Discuss this with your caregiver.  Use a condom during sexual intercourse. Following treatment, the use of condoms will help prevent reinfection.  Ask your caregiver about using over-the-counter anti-itch creams. SEEK MEDICAL CARE IF:   Your treated skin becomes red, swollen, or painful.  You have a fever.  You feel generally ill.  You feel little lumps in and around your genital area.  You are bleeding or have painful sexual intercourse. MAKE SURE YOU:   Understand these instructions.  Will watch your condition.  Will get help right away if you are not doing well or get worse. Document Released: 02/18/2000 Document Revised: 05/15/2011 Document Reviewed: 08/29/2010 [facility] Patient Information 2014 [place], [place].  Bacterial Vaginosis Bacterial vaginosis is a vaginal infection that occurs when the normal balance of bacteria in the vagina is disrupted. It results from an overgrowth of certain bacteria. This is the most common vaginal infection in women of childbearing age. Treatment is important to prevent  complications, especially in pregnant women, as it can cause a premature delivery. CAUSES  Bacterial vaginosis is caused by an increase in harmful bacteria that are normally present in smaller amounts in the vagina. Several different kinds of bacteria can cause bacterial vaginosis. However, the reason that the condition develops is not fully understood. RISK FACTORS Certain activities or behaviors can put you at an increased risk of developing bacterial vaginosis, including:  Having a new sex partner or multiple sex partners.  Douching.  Using an intrauterine device (IUD) for contraception. Women do not get bacterial vaginosis from toilet seats, bedding, swimming pools, or contact with objects around them. SIGNS AND SYMPTOMS  Some women with bacterial vaginosis have no signs or symptoms. Common symptoms include:  Grey vaginal discharge.  A fishlike odor with discharge, especially after sexual intercourse.  Itching or burning of the vagina and vulva.  Burning or pain with urination. DIAGNOSIS  Your health care provider will take a medical history and examine the vagina for signs of bacterial vaginosis. A sample of vaginal fluid may be taken. Your health care provider will look at this sample under a microscope to check for bacteria and abnormal cells. A vaginal pH test may also be done.  TREATMENT  Bacterial vaginosis may be treated with antibiotic medicines. These may be given in the form of a pill or a vaginal cream. A second round of antibiotics may be prescribed if the condition comes back after treatment.  HOME CARE INSTRUCTIONS   Only take over-the-counter or prescription medicines as directed by your health care provider.  If antibiotic medicine was prescribed, take it as directed. Make sure you finish it even if you start to feel better.  Do not have sex until treatment is completed.  Tell all sexual partners that you have a vaginal infection. They should see their health  care provider and be treated if they have problems, such as a mild rash or itching.  Practice safe sex by using condoms and only having one sex partner. SEEK MEDICAL CARE IF:   Your symptoms are not improving after 3 days of treatment.  You have increased discharge or pain.  You have a fever. MAKE SURE YOU:   Understand these instructions.  Will watch your condition.  Will get help right away if you are not doing well or get worse. FOR MORE INFORMATION  Centers for Disease Control and Prevention, Division of STD Prevention: AppraiserFraud.fi American Sexual Health Association (ASHA): www.ashastd.org  Document Released: 02/20/2005 Document Revised: 12/11/2012 Document Reviewed: 10/02/2012 [facility] Patient Information 2014 [place].

## 2013-06-21 LAB — URINE CULTURE: Colony Count: 75000

## 2013-06-23 ENCOUNTER — Other Ambulatory Visit: Payer: Self-pay | Admitting: Orthopedic Surgery

## 2013-06-23 NOTE — Progress Notes (Signed)
Encounter reviewed by Dr. Jaquavius Hudler Silva.  

## 2013-06-26 ENCOUNTER — Telehealth: Payer: Self-pay | Admitting: *Deleted

## 2013-06-26 MED ORDER — NITROFURANTOIN MONOHYD MACRO 100 MG PO CAPS
100.0000 mg | ORAL_CAPSULE | Freq: Two times a day (BID) | ORAL | Status: DC
Start: 2013-06-26 — End: 2013-07-14

## 2013-06-26 NOTE — Telephone Encounter (Signed)
Pt notified in results note.  macrobid sent to pharmacy as ordered.

## 2013-06-26 NOTE — Telephone Encounter (Signed)
Message copied by Graylon Good on Thu Jun 26, 2013  2:31 PM ------      Message from: Kem Boroughs R      Created: Sun Jun 22, 2013  6:54 PM       Let patient know that urine culture was positive also for UT.  Start her on Macrobid 100 mg BID for a week.  She has a follow up appointment this week I believe. ------

## 2013-06-27 ENCOUNTER — Encounter: Payer: Self-pay | Admitting: Obstetrics and Gynecology

## 2013-06-27 ENCOUNTER — Ambulatory Visit (INDEPENDENT_AMBULATORY_CARE_PROVIDER_SITE_OTHER): Payer: 59 | Admitting: Obstetrics and Gynecology

## 2013-06-27 VITALS — BP 116/70 | HR 60 | Ht 63.5 in | Wt 127.4 lb

## 2013-06-27 DIAGNOSIS — A630 Anogenital (venereal) warts: Secondary | ICD-10-CM

## 2013-06-27 DIAGNOSIS — F17200 Nicotine dependence, unspecified, uncomplicated: Secondary | ICD-10-CM
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
## 2013-07-07 ENCOUNTER — Telehealth: Payer: Self-pay | Admitting: Obstetrics and Gynecology

## 2013-07-07 NOTE — Telephone Encounter (Signed)
Spoke with patient. Advised that per benefits quote received: [id number] Pr $373.16 [id number] Pr $351.80 Patient agreeable. Passed call to [facility] for scheduling.

## 2013-07-07 NOTE — Telephone Encounter (Signed)
Patient is asking if Gabriel Cirri has received any information regarding her insurance coverage for her procedure.

## 2013-07-08 ENCOUNTER — Telehealth: Payer: Self-pay | Admitting: Obstetrics and Gynecology

## 2013-07-08 ENCOUNTER — Other Ambulatory Visit: Payer: Self-pay | Admitting: Obstetrics and Gynecology

## 2013-07-08 MED ORDER — BUPROPION HCL ER (XL) 150 MG PO TB24: 150.0000 mg | ORAL_TABLET | Freq: Every day | ORAL | Status: DC

## 2013-07-08 NOTE — Telephone Encounter (Signed)
Dr.Silva, patient would like to switch from Celexa to Wellbutrin at this time. Patient is currently taking Celexa 40mg  daily. Per last office visit on 4/24 patient was to contact office if she would like to make this change.

## 2013-07-08 NOTE — Telephone Encounter (Signed)
Patient is ready to start Welbutrin.

## 2013-07-08 NOTE — Telephone Encounter (Signed)
Ok to stop Celexa and start Wellbutrin XL. Patient will start with Wellbutrin XL 150 mg every morning for one week. Then increase to Wellbutrin XL 300 mg every morning.

## 2013-07-09 NOTE — Telephone Encounter (Signed)
Left message to call Kaitlyn at [phone]. 

## 2013-07-14 ENCOUNTER — Encounter: Payer: Self-pay | Admitting: Obstetrics and Gynecology

## 2013-07-14 ENCOUNTER — Ambulatory Visit (INDEPENDENT_AMBULATORY_CARE_PROVIDER_SITE_OTHER): Payer: 59 | Admitting: Obstetrics and Gynecology

## 2013-07-14 VITALS — BP 110/76 | HR 70 | Ht 63.5 in | Wt 127.0 lb

## 2013-07-14 DIAGNOSIS — A630 Anogenital (venereal) warts: Secondary | ICD-10-CM

## 2013-07-14 DIAGNOSIS — N939 Abnormal uterine and vaginal bleeding, unspecified: Secondary | ICD-10-CM

## 2013-07-14 DIAGNOSIS — B081 Molluscum contagiosum: Secondary | ICD-10-CM

## 2013-07-14 DIAGNOSIS — N926 Irregular menstruation, unspecified: Secondary | ICD-10-CM
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
## 2013-07-14 NOTE — Patient Instructions (Addendum)
The areas that I treated on the skin are molluscum contagiosum. This is a viral infection that is spread through intimate contact, but may also be spread through inanimate objects.   We will call you to schedule the ultrasound visit.

## 2013-07-17 ENCOUNTER — Other Ambulatory Visit: Payer: Self-pay | Admitting: Obstetrics and Gynecology

## 2013-07-17 ENCOUNTER — Telehealth: Payer: Self-pay | Admitting: Obstetrics and Gynecology

## 2013-07-17 NOTE — Telephone Encounter (Signed)
Patient returning call to Kaitlyn. °

## 2013-07-17 NOTE — Telephone Encounter (Signed)
Spoke with patient. Advised of message from Man as seen below. Patient is agreeable and verbalizes understanding. Will call back if she has any further needs, questions, or concerns.  Routing to provider for final review. Patient agreeable to disposition. Will close encounter

## 2013-07-17 NOTE — Telephone Encounter (Signed)
Left message to call Kaitlyn at [phone]. 

## 2013-07-17 NOTE — Telephone Encounter (Signed)
Spoke with patient. Advised that per benefit quote received, she will be responsible for $25 copay for PUS. Patient agreeable. Scheduled PUS. Advised patient of 72 hour cancellation policy and $970 cancellation fee. Patient agreeable.

## 2013-07-18 NOTE — Telephone Encounter (Signed)
Please pull paper chart for me to review.

## 2013-07-18 NOTE — Telephone Encounter (Signed)
eScribe request from [facility] for refill on CLONAZEPAM Last filled - 06/12/13, #30 X 0 Last AEX - 04/28/13 Next AEX - 05/01/14 Please advise refills.

## 2013-07-21 NOTE — Telephone Encounter (Signed)
Thank you.  I will close the encounter. 

## 2013-07-21 NOTE — Telephone Encounter (Signed)
Patient says her UTI has returned and thinks she may have kidney infection .

## 2013-07-21 NOTE — Telephone Encounter (Signed)
I recommend that patient be seen and evaluated for proper treatment. She will really need a repeat urine culture before taking any antibiotics.

## 2013-07-21 NOTE — Telephone Encounter (Signed)
Spoke with patient. Advised of importance and recommendation from [place] that patient be seen for OV. Patient agreeable. Patient would like to be seen tomorrow. Advised Dr.Silva will be out of the office. Patient states she can see Milford Cage, FNP if she is available as she has seen her before. Appointment scheduled for tomorrow at 10:15am with Milford Cage, [place]. Patient agreeable to date and time.  CC: Milford Cage, [place]

## 2013-07-21 NOTE — Telephone Encounter (Signed)
Spoke with patient. Patient states that she was seen in April for UTI and was treated with Macrobid. Patient states that she is having similar symptoms again. Patient is having right sided back pain that is a 10/10 if she is standing too long but is relieved with sitting. Patient states that her urine is a little cloudy and has a slight odor. Denies fevers or chills. Patient has PUS scheduled for 5/21 with Dr.Silva and would like to know if there is anything she can take before then to relieve her symptoms as she lives out of town and was treated for UTI not too long ago. Advised with worsening symptoms and increased pain it is best for patient to be seen in office for evaluation. Patient states that she would really like to wait until Thursday since she has to travel for the appointment. Advised would send a message to Dr.Silva and give patient a call back with further instructions and advice. Patient agreeable.

## 2013-07-22 ENCOUNTER — Encounter: Payer: Self-pay | Admitting: Nurse Practitioner

## 2013-07-22 ENCOUNTER — Ambulatory Visit (INDEPENDENT_AMBULATORY_CARE_PROVIDER_SITE_OTHER): Payer: 59 | Admitting: Nurse Practitioner

## 2013-07-22 ENCOUNTER — Telehealth: Payer: Self-pay | Admitting: Nurse Practitioner

## 2013-07-22 VITALS — BP 102/60 | HR 80 | Temp 98.7°F | Resp 20 | Ht 63.5 in | Wt 124.8 lb

## 2013-07-22 DIAGNOSIS — B373 Candidiasis of vulva and vagina: Secondary | ICD-10-CM

## 2013-07-22 DIAGNOSIS — B3731 Acute candidiasis of vulva and vagina: Secondary | ICD-10-CM

## 2013-07-22 DIAGNOSIS — N390 Urinary tract infection, site not specified: Secondary | ICD-10-CM

## 2013-07-22 LAB — POCT URINALYSIS DIPSTICK
BILIRUBIN UA: NEGATIVE
GLUCOSE UA: NEGATIVE
NITRITE UA: NEGATIVE
Urobilinogen, UA: NEGATIVE
pH, UA: 5

## 2013-07-22 LAB — CBC WITH DIFFERENTIAL/PLATELET
Basophils Absolute: 0 10*3/uL (ref 0.0–0.1)
Basophils Relative: 0 % (ref 0–1)
EOS PCT: 0 % (ref 0–5)
Eosinophils Absolute: 0 10*3/uL (ref 0.0–0.7)
HEMATOCRIT: 37.2 % (ref 36.0–46.0)
Hemoglobin: 12.9 g/dL (ref 12.0–15.0)
LYMPHS ABS: 1.2 10*3/uL (ref 0.7–4.0)
LYMPHS PCT: 6 % — AB (ref 12–46)
MCH: 32.1 pg (ref 26.0–34.0)
MCHC: 34.7 g/dL (ref 30.0–36.0)
MCV: 92.5 fL (ref 78.0–100.0)
MONO ABS: 1.4 10*3/uL — AB (ref 0.1–1.0)
Monocytes Relative: 7 % (ref 3–12)
NEUTROS ABS: 17 10*3/uL — AB (ref 1.7–7.7)
Neutrophils Relative %: 87 % — ABNORMAL HIGH (ref 43–77)
Platelets: 219 10*3/uL (ref 150–400)
RBC: 4.02 MIL/uL (ref 3.87–5.11)
RDW: 12.2 % (ref 11.5–15.5)
WBC: 19.5 10*3/uL — AB (ref 4.0–10.5)

## 2013-07-22 MED ORDER — FLUCONAZOLE 150 MG PO TABS: 150.0000 mg | ORAL_TABLET | Freq: Once | ORAL | Status: DC

## 2013-07-22 MED ORDER — CIPROFLOXACIN HCL 500 MG PO TABS: 500.0000 mg | ORAL_TABLET | Freq: Two times a day (BID) | ORAL | Status: DC

## 2013-07-22 NOTE — Telephone Encounter (Signed)
Dr. Sabra Heck did not feel that Rocephin injection was needed at this time as long as she was able to get med's and keep it down.  A progress report tomorrow may indicate different if she is having a fever.

## 2013-07-22 NOTE — Patient Instructions (Signed)
Urinary Tract Infection  Urinary tract infections (UTIs) can develop anywhere along your urinary tract. Your urinary tract is your body's drainage system for removing wastes and extra water. Your urinary tract includes two kidneys, two ureters, a bladder, and a urethra. Your kidneys are a pair of bean-shaped organs. Each kidney is about the size of your fist. They are located below your ribs, one on each side of your spine.  CAUSES  Infections are caused by microbes, which are microscopic organisms, including fungi, viruses, and bacteria. These organisms are so small that they can only be seen through a microscope. Bacteria are the microbes that most commonly cause UTIs.  SYMPTOMS   Symptoms of UTIs may vary by age and gender of the patient and by the location of the infection. Symptoms in young women typically include a frequent and intense urge to urinate and a painful, burning feeling in the bladder or urethra during urination. Older women and men are more likely to be tired, shaky, and weak and have muscle aches and abdominal pain. A fever may mean the infection is in your kidneys. Other symptoms of a kidney infection include pain in your back or sides below the ribs, nausea, and vomiting.  DIAGNOSIS  To diagnose a UTI, your caregiver will ask you about your symptoms. Your caregiver also will ask to provide a urine sample. The urine sample will be tested for bacteria and white blood cells. White blood cells are made by your body to help fight infection.  TREATMENT   Typically, UTIs can be treated with medication. Because most UTIs are caused by a bacterial infection, they usually can be treated with the use of antibiotics. The choice of antibiotic and length of treatment depend on your symptoms and the type of bacteria causing your infection.  HOME CARE INSTRUCTIONS   If you were prescribed antibiotics, take them exactly as your caregiver instructs you. Finish the medication even if you feel better after you  have only taken some of the medication.   Drink enough water and fluids to keep your urine clear or pale yellow.   Avoid caffeine, tea, and carbonated beverages. They tend to irritate your bladder.   Empty your bladder often. Avoid holding urine for long periods of time.   Empty your bladder before and after sexual intercourse.   After a bowel movement, women should cleanse from front to back. Use each tissue only once.  SEEK MEDICAL CARE IF:    You have back pain.   You develop a fever.   Your symptoms do not begin to resolve within 3 days.  SEEK IMMEDIATE MEDICAL CARE IF:    You have severe back pain or lower abdominal pain.   You develop chills.   You have nausea or vomiting.   You have continued burning or discomfort with urination.  MAKE SURE YOU:    Understand these instructions.   Will watch your condition.   Will get help right away if you are not doing well or get worse.  Document Released: 11/30/2004 Document Revised: 08/22/2011 Document Reviewed: 03/31/2011  [employer] Patient Information 2014 [employer].

## 2013-07-22 NOTE — Telephone Encounter (Signed)
Routing to [employer], FNP for review of 07/22/13 lab results. It looks like the only ones we have back at this time are the urinalysis and CBC.

## 2013-07-22 NOTE — Telephone Encounter (Signed)
Per Dr. Sabra Heck patient will need to continue with Cipro BID, force fluids.  Will call her tomorrow for a progress report and then report to Dr. Quincy Simmonds and see if PUS should be done on Thursday.  Still needs a follow up with repeat CBC

## 2013-07-22 NOTE — Telephone Encounter (Signed)
Patient is calling for recent results.  °

## 2013-07-22 NOTE — Telephone Encounter (Addendum)
Patient notified of message from Milford Cage, [place] she is agreeable to plan. She states she is having back/flank pain. She is taking Advil and Aleve without relief. Advised patient not to take both Advil and Aleve concurrently. Patient states she is now out of Aleve and will only take Motrin. Advised 600 mg of Motrin with food q 8 hours prn. Also, Tylenol as needed, to follow the instructions on the bottle, unsure if she has 500 mg or 325 mg strength and watch dosage maximum per day. Patient took warm bath with some relief. She will call us tomorrow with update.   Routing to Dr. Quincy Simmonds.      Cc Milford Cage, FNP

## 2013-07-22 NOTE — Telephone Encounter (Signed)
Spoke with patient.  Message from Milford Cage, [place] given. She has picked up Cipro but has not started it. Advised to take a dose of Cipro as soon as possible. Advised patient to call us with update tomorrow morning and can decide with Dr. Quincy Simmonds if can go forward with PUS as scheduled or hold off.   Patient is wondering if she needs to come in for an injection. Advised that Milford Cage, FNP did not give that order, but that I would confirm and call back if she needed to be seen for injection.

## 2013-07-22 NOTE — Progress Notes (Signed)
Subjective:     Patient ID: Regina Schultz, female   DOB: [birth year], 48 y.o.   MRN: [id number]  HPI  This 48 yo WD Fe with symptoms of UTI.  Started on Sunday.  A strong urine odor on Friday.  Some low grade fever last pm (undocumented) and chills.  Last SA on Thursday and Saturday.  Same partner for a year.  Some vaginal discharge this am. She at first thought she strained a muscle and it does hurt to move but now feels bad in general.  She is scheduled to return on Thursday am for a PUS to evaluate vaginal bleeding on 07/09/13 while on Tamoxifem.  She still has condyloma but thinks they are some better.    Review of Systems  Constitutional: Negative for fever, chills and fatigue.  Respiratory: Negative.   Cardiovascular: Negative.   Gastrointestinal: Positive for nausea and abdominal pain.  Genitourinary: Positive for frequency, flank pain, vaginal discharge and pelvic pain. Negative for dyspareunia.  Musculoskeletal: Negative.   Skin: Negative.   Neurological: Negative.   Psychiatric/Behavioral: Negative.        Objective:   Physical Exam  Constitutional: She is oriented to person, place, and time. She appears well-developed and well-nourished. She appears distressed.  Abdominal: Soft. Bowel sounds are normal. She exhibits no distension. There is tenderness. There is no rebound and no guarding.  Flank pain on the right  Genitourinary:  White vaginal discharge that is thick.  Wet Prep:  Ph: 4.; NSS: negative, KOH: + yeast.  Condyloma still present at introitus and labia and inside vaginal wall.  Musculoskeletal: Normal range of motion.  Neurological: She is alert and oriented to person, place, and time.  Psychiatric: She has a normal mood and affect. Her behavior is normal. Judgment and thought content normal.       Assessment:     yeast vaginitis UTI - R/O pyelo Condyloma Vaginal bleeding on Tamoxifen  Plan:     Diflucan 150 mg. X 2  Cipro 500 mg BID for a week Stat CBC  and follow with results       3:10 pm CBC shows WBC elevated at 19.5 - per consult with Dr. Sabra Heck if she does OK with Cipro antibiotic and able to tolerate with po med's does not need IM antibiotics.  Phone nurse will follow with a progress repot tomorrow.

## 2013-07-23 LAB — URINALYSIS, MICROSCOPIC ONLY
Casts: NONE SEEN
Crystals: NONE SEEN

## 2013-07-23 NOTE — Telephone Encounter (Signed)
Spoke with patient. She states "I am feeling much better." She states pain is 3/10 and has taken 3 doses of Cipro so far. Denies fevers, chills, nausea, vomiting. Advised she still needs to follow up as scheduled with Dr. Quincy Simmonds, but will send a message to Dr. Quincy Simmonds to confirm if still should proceed with pelvic ultrasound or office visit only. Advised would send a message to Dr. Quincy Simmonds and return call with update.

## 2013-07-23 NOTE — Telephone Encounter (Signed)
OK to proceed with a pelvic ultrasound. We are not doing a sonohysterogram and endometrial biopsy.  This will also be an opportunity to re-evaluate the patient's UTI progress.  Please keep the appointment.   Sorry for the delay in getting back to you. I did not get to this message until this evening.

## 2013-07-24 ENCOUNTER — Telehealth: Payer: Self-pay | Admitting: *Deleted

## 2013-07-24 ENCOUNTER — Encounter: Payer: Self-pay | Admitting: Obstetrics and Gynecology

## 2013-07-24 ENCOUNTER — Ambulatory Visit (INDEPENDENT_AMBULATORY_CARE_PROVIDER_SITE_OTHER): Payer: 59 | Admitting: Obstetrics and Gynecology

## 2013-07-24 ENCOUNTER — Ambulatory Visit (INDEPENDENT_AMBULATORY_CARE_PROVIDER_SITE_OTHER): Payer: 59

## 2013-07-24 DIAGNOSIS — R9389 Abnormal findings on diagnostic imaging of other specified body structures: Secondary | ICD-10-CM

## 2013-07-24 DIAGNOSIS — N939 Abnormal uterine and vaginal bleeding, unspecified: Secondary | ICD-10-CM

## 2013-07-24 DIAGNOSIS — N390 Urinary tract infection, site not specified: Secondary | ICD-10-CM

## 2013-07-24 DIAGNOSIS — N926 Irregular menstruation, unspecified: Secondary | ICD-10-CM

## 2013-07-24 LAB — URINE CULTURE

## 2013-07-24 NOTE — Telephone Encounter (Signed)
Spoke with patient. She is on the way. She is happy that we will do pelvic ultrasound today.   Will close encounter.

## 2013-07-24 NOTE — Telephone Encounter (Signed)
Message copied by Graylon Good on Thu Jul 24, 2013  3:35 PM ------      Message from: Keachi, Mary Esther: Thu Jul 24, 2013 12:45 PM      Regarding: Please call patient with final urine culture result       Colletta [place],            Please call patient with final urine culture.       Sensitivities were not back when I saw her this morning.       EColi is sensitive to Cipro.            I told her to finish her Rx in entirety this am as she was responding clinically.            Thanks.            Cc - [address] ------

## 2013-07-24 NOTE — Telephone Encounter (Signed)
Advised pt of results and recommendations.  She is agreeable.  Advised she will need TOC in 2 weeks.  She will schedule biopsy and TOC together if possible.
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
## 2013-07-25 ENCOUNTER — Telehealth: Payer: Self-pay | Admitting: Obstetrics and Gynecology

## 2013-07-25 DIAGNOSIS — A630 Anogenital (venereal) warts: Secondary | ICD-10-CM

## 2013-07-25 NOTE — Telephone Encounter (Signed)
That should be just fine timing wise for the colposcopy.  She is on Tamoxifen and does not usually have vaginal bleeding except recently. This is under evaluation as well.

## 2013-07-25 NOTE — Telephone Encounter (Signed)
Spoke with patient. She is scheduled for colposcopy of  vagina and cervix and for a colposcopy with biopsy of the vulva. Pre-procedure instructions given.  Motrin instructions given. Motrin=Advil=Ibuprofen Can take 800 mg (Can purchase over the counter, you will need four 200 mg pills).  Take with food. Make sure to eat a meal before appointment and drink plenty of fluids. Patient verbalized understanding and will call to reschedule if will be on menses or has any concerns regarding pregnancy. Advised will need to cancel within 24 hours or will have $100.00 no show fee placed to account.  Dr. Quincy Simmonds, patient is menopausal? I wanted to make sure the timing for her colposcopy with be okay. She is scheduled for 08/07/13 at 1500.

## 2013-07-25 NOTE — Telephone Encounter (Signed)
Patient is ready to schedule her procedure. 

## 2013-07-28 NOTE — Progress Notes (Signed)
Encounter reviewed by Dr. Brook Silva.  

## 2013-08-07 ENCOUNTER — Ambulatory Visit (INDEPENDENT_AMBULATORY_CARE_PROVIDER_SITE_OTHER): Payer: 59 | Admitting: Obstetrics and Gynecology

## 2013-08-07 ENCOUNTER — Encounter: Payer: Self-pay | Admitting: Obstetrics and Gynecology

## 2013-08-07 VITALS — BP 120/60 | HR 64 | Wt 126.6 lb

## 2013-08-07 DIAGNOSIS — A630 Anogenital (venereal) warts: Secondary | ICD-10-CM

## 2013-08-07 NOTE — Progress Notes (Signed)
Subjective:     Patient ID: Regina Schultz, female   DOB: 1965-08-20, 48 y.o.   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  Pap normal with negative HR HPV on 04/28/13. 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   Recent vaginal bleeding. Is currently on tamoxifen for breast cancer.  Had not had a menstruation in years.  Office ultrasound 07/24/13 showed thickened endometrium 14.28 mm.  No myometrial masses.  Normal ovaries.  No free fluid. Attempt to do office endometrial biopsy on 07/14/13 unsuccessful due to a stenotic cervix.   Review of Systems     Objective:   Physical Exam  Genitourinary:        Colposcopy of the vagina and the vulva. Consent for procedure.  Speculum placed in vagina.  Acetic acid soaked gauze pads in the vagina.  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  Two biopsies taken in the midline.  Sent to pathology together as specimen A.  Minimal bleeding.  Speculum removed.   Acetic acid soaked gauze pads to the vulva.   Condyloma noted of the hymenal area and the perineal body.  Sterile prep with Hibiclens.  Hurricaine spray to perineum. Small 2 -3 mm condyloma removed with fine scissors and sent to pathology as specimen B. Minimal bleeding.   No complication to procedures.     Assessment:     Abnormal uterine bleeding. On Tamoxifen.  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 History of LEEP.  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    Plan:     Biopsies to pathology.  Call for heavy bleeding or fever.  Follow up in about 8 days for discussion of care which is expected to be hysteroscopy with dilation and curettage and laser ablation of vaginal and vulvar condyloma.   After visit summary to patient.

## 2013-08-12 LAB — IPS OTHER TISSUE BIOPSY

## 2013-08-15 ENCOUNTER — Ambulatory Visit (INDEPENDENT_AMBULATORY_CARE_PROVIDER_SITE_OTHER): Payer: 59 | Admitting: Obstetrics and Gynecology

## 2013-08-15 ENCOUNTER — Encounter: Payer: Self-pay | Admitting: Obstetrics and Gynecology

## 2013-08-15 VITALS — BP 120/78 | HR 60 | Ht 63.5 in | Wt 125.2 lb

## 2013-08-15 DIAGNOSIS — N890 Mild vaginal dysplasia: Secondary | ICD-10-CM

## 2013-08-15 DIAGNOSIS — N893 Dysplasia of vagina, unspecified: Secondary | ICD-10-CM

## 2013-08-15 DIAGNOSIS — R9389 Abnormal findings on diagnostic imaging of other specified body structures: Secondary | ICD-10-CM

## 2013-08-15 DIAGNOSIS — N900 Mild vulvar dysplasia: Secondary | ICD-10-CM
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
## 2013-08-15 NOTE — Patient Instructions (Signed)
We will call you about your insurance information about surgery and we will schedule as well.

## 2013-08-16 ENCOUNTER — Encounter: Payer: Self-pay | Admitting: Obstetrics and Gynecology

## 2013-08-19 ENCOUNTER — Telehealth: Payer: Self-pay | Admitting: Obstetrics and Gynecology

## 2013-08-19 NOTE — Telephone Encounter (Signed)
Patient returned call. I advised that per benefit quote received, she will be responsible for $882.38 for the surgeons portion of her surgery. Also advised that payment is due in full at least 2 weeks prior to her scheduled surgery date. Patient agreeable. She would like to speak to someone ASAP regarding scheduling as she has a vacation planned for July and needs to know if she needs to cancel it.

## 2013-08-19 NOTE — Telephone Encounter (Signed)
Case request sent to hosp for 09-23-13 at 1100.

## 2013-08-19 NOTE — Telephone Encounter (Signed)
Spoke to patient approximately 1430.  She is really limited during first two weeks of month due to work issues. She would like 09-24-13 if possible and will reschedule her vacation plans per her own preference. Will schedule and call her back.

## 2013-08-19 NOTE — Telephone Encounter (Signed)
Left message for patient to call back. Need to go over surgery benefits. °

## 2013-08-21 NOTE — Telephone Encounter (Signed)
I have reviewed the instructions given and agree.  I will close the encounter.

## 2013-08-21 NOTE — Telephone Encounter (Signed)
Spoke with patient & gave her surgery information. Pt understands not to eat or drink anything including water after midnight before her surgery. I went over meds to avoid for 2weeks before surgery as well as what not to wear such as lotions. Pt aware the hospital will give her a call with the time she needs to arrive at the hospital. Surgery date & time are 09-23-13 @ 11am. Pre-op appt is wed 09-10-13 @ 1:30pm. 2wk post op appt is 10-08-13 at [facility] surgery information form mailed to patient & we have a copy for her chart.  Close encounter when done

## 2013-08-25 ENCOUNTER — Telehealth: Payer: Self-pay | Admitting: Obstetrics and Gynecology

## 2013-08-25 NOTE — Telephone Encounter (Signed)
August 25, 2013    Dear Ms. Ellee Wawrzyniak,  Your surgery is scheduled for July 21.  Upon requesting authorization for your surgery, your insurance company has informed us that they will cover 80% of the charges after a $1500 deductible, and you will be responsible to pay approximately $882.38.  It is our office policy that this amount is paid in full two weeks prior to your surgery. Your payment is due on September 09, 2013.  If there is a balance due after your insurance company pays their portion, we will send you a bill.  If there is a refund due to you, we will send you a check within one month.  Payment may be made by cash, check, Visa, Nurse, learning disability. Payment can be made in the office or over the telephone.  If payment is not made two weeks prior to your surgery, we will have to reschedule your surgery.  The above fee includes only our fee for the surgery and does not include charges you may have from the facility, anesthesia or pathology.  If you have any questions, please call us at [phone].

## 2013-09-08 ENCOUNTER — Telehealth: Payer: Self-pay | Admitting: *Deleted

## 2013-09-08 NOTE — Telephone Encounter (Signed)
Refill for Citalopram received from pharmacy via fax on 09-04-13 and was initially returned to pharmacy with a denial since this med was discontinued on 07-08-13 (see phone note from this date).  Second request for refill received over weekend so call to patient to check with her. Patient states she needs refill on the "Celexa." Explained refill initally denied due to her request to switch to Wellbutrin. Patient states she thought she was to take both so she has been taking both the whole time (since call on 07-08-13).  She states she has been off Celexa since she ran out on 09-04-13 and has been fine. She is interested in getting off medications if possible. Has pre-op scheduled for 09-10-13 and surgery scheduled fir 09-24-13 Advised I will check with Dr Quincy Simmonds for additional advice. Ok to stay off until you see her at preop on Wednesday 09-10-13?        Initial denial form faxed to pharmacy on 09-04-13 actually FAILED and never made it to pharmacy. Failed notice was on fax machine this am. Office had been closed on 09-05-13 for holiday weekend.

## 2013-09-08 NOTE — Telephone Encounter (Signed)
OK to stay off Celexa as this was the plan. I can reassess at her preop visit to see how she is doing with this change.   Thank you.

## 2013-09-08 NOTE — Telephone Encounter (Signed)
Call to patient and notified ok to stay off Celexa and discuss with Dr Quincy Simmonds on Wed 09-10-13. Cal to patients pharm, [facility] in [place], [address] and notified no refill for now.  Encounter closed.

## 2013-09-09 ENCOUNTER — Encounter (HOSPITAL_COMMUNITY): Payer: Self-pay | Admitting: Pharmacist

## 2013-09-10 ENCOUNTER — Encounter: Payer: Self-pay | Admitting: Obstetrics and Gynecology

## 2013-09-10 ENCOUNTER — Ambulatory Visit (INDEPENDENT_AMBULATORY_CARE_PROVIDER_SITE_OTHER): Payer: 59 | Admitting: Obstetrics and Gynecology

## 2013-09-10 VITALS — BP 128/86 | HR 72 | Resp 18 | Ht 63.5 in | Wt 126.0 lb

## 2013-09-10 DIAGNOSIS — N893 Dysplasia of vagina, unspecified: Secondary | ICD-10-CM

## 2013-09-10 DIAGNOSIS — N900 Mild vulvar dysplasia: Secondary | ICD-10-CM

## 2013-09-10 DIAGNOSIS — N950 Postmenopausal bleeding: Secondary | ICD-10-CM

## 2013-09-10 DIAGNOSIS — N890 Mild vaginal dysplasia: Secondary | ICD-10-CM

## 2013-09-10 DIAGNOSIS — R9389 Abnormal findings on diagnostic imaging of other specified body structures: Secondary | ICD-10-CM

## 2013-09-10 DIAGNOSIS — F17200 Nicotine dependence, unspecified, uncomplicated: Secondary | ICD-10-CM

## 2013-09-10 MED ORDER — IBUPROFEN 800 MG PO TABS: 800.0000 mg | ORAL_TABLET | Freq: Three times a day (TID) | ORAL | Status: DC | PRN

## 2013-09-10 MED ORDER — CITALOPRAM HYDROBROMIDE 20 MG PO TABS: 20.0000 mg | ORAL_TABLET | Freq: Every day | ORAL | Status: DC

## 2013-09-10 MED ORDER — OXYCODONE-ACETAMINOPHEN 5-325 MG PO TABS: 2.0000 | ORAL_TABLET | ORAL | Status: DC | PRN

## 2013-09-10 MED ORDER — SILVER SULFADIAZINE 1 % EX CREA: 1.0000 "application " | TOPICAL_CREAM | Freq: Every day | CUTANEOUS | Status: DC
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
## 2013-09-10 NOTE — Patient Instructions (Signed)
I will see you the day of surgery! 

## 2013-09-11 DIAGNOSIS — N900 Mild vulvar dysplasia: Secondary | ICD-10-CM | POA: Insufficient documentation

## 2013-09-11 DIAGNOSIS — F17200 Nicotine dependence, unspecified, uncomplicated: Secondary | ICD-10-CM | POA: Insufficient documentation

## 2013-09-11 DIAGNOSIS — N890 Mild vaginal dysplasia: Secondary | ICD-10-CM | POA: Insufficient documentation

## 2013-09-11 DIAGNOSIS — N950 Postmenopausal bleeding: Secondary | ICD-10-CM | POA: Insufficient documentation
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
## 2013-09-23 ENCOUNTER — Ambulatory Visit (HOSPITAL_COMMUNITY)
Admission: RE | Admit: 2013-09-23 | Discharge: 2013-09-23 | Disposition: A | Discharge status: Home / Self Care | Payer: 59 | Source: Ambulatory Visit | Attending: Obstetrics and Gynecology | Admitting: Obstetrics and Gynecology

## 2013-09-23 ENCOUNTER — Encounter (HOSPITAL_COMMUNITY): Payer: 59 | Admitting: Anesthesiology

## 2013-09-23 ENCOUNTER — Encounter (HOSPITAL_COMMUNITY): Payer: Self-pay | Admitting: Anesthesiology

## 2013-09-23 ENCOUNTER — Encounter (HOSPITAL_COMMUNITY)
Admission: RE | Disposition: A | Discharge status: Home / Self Care | Payer: Self-pay | Source: Ambulatory Visit | Attending: Obstetrics and Gynecology

## 2013-09-23 ENCOUNTER — Ambulatory Visit (HOSPITAL_COMMUNITY): Payer: 59 | Admitting: Anesthesiology

## 2013-09-23 DIAGNOSIS — N893 Dysplasia of vagina, unspecified: Secondary | ICD-10-CM | POA: Insufficient documentation

## 2013-09-23 DIAGNOSIS — M899 Disorder of bone, unspecified: Secondary | ICD-10-CM | POA: Insufficient documentation

## 2013-09-23 DIAGNOSIS — N882 Stricture and stenosis of cervix uteri: Secondary | ICD-10-CM

## 2013-09-23 DIAGNOSIS — A6000 Herpesviral infection of urogenital system, unspecified: Secondary | ICD-10-CM | POA: Insufficient documentation

## 2013-09-23 DIAGNOSIS — N8500 Endometrial hyperplasia, unspecified: Secondary | ICD-10-CM

## 2013-09-23 DIAGNOSIS — Z853 Personal history of malignant neoplasm of breast: Secondary | ICD-10-CM | POA: Insufficient documentation

## 2013-09-23 DIAGNOSIS — M949 Disorder of cartilage, unspecified: Secondary | ICD-10-CM

## 2013-09-23 DIAGNOSIS — R011 Cardiac murmur, unspecified: Secondary | ICD-10-CM | POA: Insufficient documentation

## 2013-09-23 DIAGNOSIS — A630 Anogenital (venereal) warts: Secondary | ICD-10-CM

## 2013-09-23 DIAGNOSIS — Z8249 Family history of ischemic heart disease and other diseases of the circulatory system: Secondary | ICD-10-CM | POA: Insufficient documentation

## 2013-09-23 DIAGNOSIS — Z823 Family history of stroke: Secondary | ICD-10-CM | POA: Insufficient documentation

## 2013-09-23 DIAGNOSIS — N950 Postmenopausal bleeding: Secondary | ICD-10-CM

## 2013-09-23 DIAGNOSIS — N900 Mild vulvar dysplasia: Secondary | ICD-10-CM | POA: Insufficient documentation

## 2013-09-23 DIAGNOSIS — N840 Polyp of corpus uteri: Secondary | ICD-10-CM | POA: Insufficient documentation

## 2013-09-23 DIAGNOSIS — R9389 Abnormal findings on diagnostic imaging of other specified body structures: Secondary | ICD-10-CM | POA: Insufficient documentation

## 2013-09-23 HISTORY — PX: CO2 LASER APPLICATION: SHX5778

## 2013-09-23 HISTORY — PX: DILATATION & CURRETTAGE/HYSTEROSCOPY WITH RESECTOCOPE: SHX5572

## 2013-09-23 LAB — CBC
HCT: 40.8 % (ref 36.0–46.0)
Hemoglobin: 14.1 g/dL (ref 12.0–15.0)
MCH: 32.1 pg (ref 26.0–34.0)
MCHC: 34.6 g/dL (ref 30.0–36.0)
MCV: 92.9 fL (ref 78.0–100.0)
Platelets: 275 10*3/uL (ref 150–400)
RBC: 4.39 MIL/uL (ref 3.87–5.11)
RDW: 12.1 % (ref 11.5–15.5)
WBC: 6.6 10*3/uL (ref 4.0–10.5)

## 2013-09-23 LAB — BASIC METABOLIC PANEL
Anion gap: 11 (ref 5–15)
BUN: 7 mg/dL (ref 6–23)
CO2: 28 mEq/L (ref 19–32)
Calcium: 9.2 mg/dL (ref 8.4–10.5)
Chloride: 104 mEq/L (ref 96–112)
Creatinine, Ser: 0.75 mg/dL (ref 0.50–1.10)
GFR calc Af Amer: >90 mL/min (ref >90)
Glucose, Bld: 95 mg/dL (ref 70–99)
POTASSIUM: 4.1 meq/L (ref 3.7–5.3)
Sodium: 143 mEq/L (ref 137–147)

## 2013-09-23 LAB — PREGNANCY, URINE: Preg Test, Ur: NEGATIVE

## 2013-09-23 SURGERY — DILATATION & CURETTAGE/HYSTEROSCOPY WITH RESECTOCOPE
Anesthesia: General | Site: Vagina

## 2013-09-23 MED ORDER — FERRIC SUBSULFATE 259 MG/GM EX SOLN
CUTANEOUS | Status: AC
  Filled 2013-09-23: qty 8

## 2013-09-23 MED ORDER — IODINE STRONG (LUGOLS) 5 % PO SOLN: ORAL | Status: DC | PRN

## 2013-09-23 MED ORDER — LIDOCAINE HCL 1 % IJ SOLN
INTRAMUSCULAR | Status: AC
  Filled 2013-09-23: qty 20

## 2013-09-23 MED ORDER — ACETAMINOPHEN 160 MG/5ML PO SOLN
ORAL | Status: AC
  Administered 2013-09-23: 650 mg via ORAL
  Filled 2013-09-23: qty 20.3

## 2013-09-23 MED ORDER — CEFAZOLIN SODIUM-DEXTROSE 2-3 GM-% IV SOLR
2.0000 g | INTRAVENOUS | Status: AC
  Administered 2013-09-23: 2 g via INTRAVENOUS

## 2013-09-23 MED ORDER — LIDOCAINE HCL (CARDIAC) 20 MG/ML IV SOLN
INTRAVENOUS | Status: AC
  Filled 2013-09-23: qty 5

## 2013-09-23 MED ORDER — DEXAMETHASONE SODIUM PHOSPHATE 10 MG/ML IJ SOLN
INTRAMUSCULAR | Status: DC | PRN
  Administered 2013-09-23: 10 mg via INTRAVENOUS

## 2013-09-23 MED ORDER — KETOROLAC TROMETHAMINE 30 MG/ML IJ SOLN
INTRAMUSCULAR | Status: AC
  Filled 2013-09-23: qty 1

## 2013-09-23 MED ORDER — LIDOCAINE HCL (CARDIAC) 20 MG/ML IV SOLN
INTRAVENOUS | Status: DC | PRN
  Administered 2013-09-23: 40 mg via INTRAVENOUS

## 2013-09-23 MED ORDER — ONDANSETRON HCL 4 MG/2ML IJ SOLN
INTRAMUSCULAR | Status: AC
  Filled 2013-09-23: qty 2

## 2013-09-23 MED ORDER — LACTATED RINGERS IV SOLN: INTRAVENOUS | Status: DC

## 2013-09-23 MED ORDER — KETOROLAC TROMETHAMINE 30 MG/ML IJ SOLN
INTRAMUSCULAR | Status: DC | PRN
  Administered 2013-09-23: 30 mg via INTRAVENOUS

## 2013-09-23 MED ORDER — ACETIC ACID 4% SOLUTION
Status: DC | PRN
  Administered 2013-09-23: 4 via TOPICAL

## 2013-09-23 MED ORDER — PROPOFOL 10 MG/ML IV EMUL
INTRAVENOUS | Status: AC
  Filled 2013-09-23: qty 20

## 2013-09-23 MED ORDER — FENTANYL CITRATE 0.05 MG/ML IJ SOLN
INTRAMUSCULAR | Status: AC
  Filled 2013-09-23: qty 2

## 2013-09-23 MED ORDER — LACTATED RINGERS IV SOLN
INTRAVENOUS | Status: DC
  Administered 2013-09-23 (×2): via INTRAVENOUS

## 2013-09-23 MED ORDER — ACETIC ACID 5 % SOLN
Status: AC
  Filled 2013-09-23: qty 500

## 2013-09-23 MED ORDER — FENTANYL CITRATE 0.05 MG/ML IJ SOLN
INTRAMUSCULAR | Status: DC | PRN
  Administered 2013-09-23 (×2): 50 ug via INTRAVENOUS

## 2013-09-23 MED ORDER — FENTANYL CITRATE 0.05 MG/ML IJ SOLN: 25.0000 ug | INTRAMUSCULAR | Status: DC | PRN

## 2013-09-23 MED ORDER — ONDANSETRON HCL 4 MG/2ML IJ SOLN
INTRAMUSCULAR | Status: DC | PRN
  Administered 2013-09-23: 4 mg via INTRAVENOUS

## 2013-09-23 MED ORDER — MIDAZOLAM HCL 2 MG/2ML IJ SOLN
INTRAMUSCULAR | Status: AC
  Filled 2013-09-23: qty 2

## 2013-09-23 MED ORDER — GLYCINE 1.5 % IR SOLN
Status: DC | PRN
  Administered 2013-09-23: 3000 mL

## 2013-09-23 MED ORDER — PROPOFOL 10 MG/ML IV BOLUS
INTRAVENOUS | Status: DC | PRN
  Administered 2013-09-23: 130 mg via INTRAVENOUS

## 2013-09-23 MED ORDER — DEXAMETHASONE SODIUM PHOSPHATE 10 MG/ML IJ SOLN
INTRAMUSCULAR | Status: AC
  Filled 2013-09-23: qty 1

## 2013-09-23 MED ORDER — SILVER SULFADIAZINE 1 % EX CREA
TOPICAL_CREAM | CUTANEOUS | Status: AC
  Filled 2013-09-23: qty 50

## 2013-09-23 MED ORDER — ACETAMINOPHEN 160 MG/5ML PO SOLN
650.0000 mg | Freq: Four times a day (QID) | ORAL | Status: DC | PRN
  Administered 2013-09-23: 650 mg via ORAL

## 2013-09-23 MED ORDER — IODINE STRONG (LUGOLS) 5 % PO SOLN
ORAL | Status: AC
  Filled 2013-09-23: qty 1

## 2013-09-23 MED ORDER — CEFAZOLIN SODIUM-DEXTROSE 2-3 GM-% IV SOLR
INTRAVENOUS | Status: AC
  Filled 2013-09-23: qty 50

## 2013-09-23 MED ORDER — MIDAZOLAM HCL 5 MG/5ML IJ SOLN
INTRAMUSCULAR | Status: DC | PRN
  Administered 2013-09-23: 2 mg via INTRAVENOUS

## 2013-09-23 MED ORDER — BUPIVACAINE HCL (PF) 0.25 % IJ SOLN
INTRAMUSCULAR | Status: AC
  Filled 2013-09-23: qty 30

## 2013-09-23 SURGICAL SUPPLY — 32 items
APPLICATOR COTTON TIP 6IN STRL (MISCELLANEOUS) ×2 IMPLANT
CANISTER SUCT 3000ML (MISCELLANEOUS) ×2 IMPLANT
CATH ROBINSON RED A/P 16FR (CATHETERS) ×2 IMPLANT
CLOTH BEACON ORANGE TIMEOUT ST (SAFETY) ×2 IMPLANT
CONTAINER PREFILL 10% NBF 60ML (FORM) ×4 IMPLANT
DEPRESSOR TONGUE BLADE WOOD (MISCELLANEOUS) ×2 IMPLANT
DRAPE HYSTEROSCOPY (DRAPE) ×2 IMPLANT
DRSG TELFA 3X8 NADH (GAUZE/BANDAGES/DRESSINGS) ×2 IMPLANT
ELECT REM PT RETURN 9FT ADLT (ELECTROSURGICAL)
ELECTRODE REM PT RTRN 9FT ADLT (ELECTROSURGICAL) IMPLANT
EVACUATOR PREFILTER SMOKE (MISCELLANEOUS) ×2 IMPLANT
GAUZE SPONGE 4X4 16PLY XRAY LF (GAUZE/BANDAGES/DRESSINGS) IMPLANT
GLOVE BIO SURGEON STRL SZ 6.5 (GLOVE) ×2 IMPLANT
GLOVE BIOGEL PI IND STRL 7.0 (GLOVE) ×1 IMPLANT
GLOVE BIOGEL PI INDICATOR 7.0 (GLOVE) ×1
GOWN STRL REUS W/TWL LRG LVL3 (GOWN DISPOSABLE) ×4 IMPLANT
HOSE NS SMOKE EVAC 7/8 X6 (MISCELLANEOUS) ×2 IMPLANT
LOOP ANGLED CUTTING 22FR (CUTTING LOOP) ×1 IMPLANT
NDL SPNL 20GX3.5 QUINCKE YW (NEEDLE) IMPLANT
NEEDLE SPNL 20GX3.5 QUINCKE YW (NEEDLE) ×2 IMPLANT
NS IRRIG 1000ML POUR BTL (IV SOLUTION) ×2 IMPLANT
PACK VAGINAL MINOR WOMEN LF (CUSTOM PROCEDURE TRAY) ×2 IMPLANT
PAD DRESSING TELFA 3X8 NADH (GAUZE/BANDAGES/DRESSINGS) ×1 IMPLANT
PAD OB MATERNITY 4.3X12.25 (PERSONAL CARE ITEMS) ×2 IMPLANT
REDUCER FITTING SMOKE EVAC (MISCELLANEOUS) ×2 IMPLANT
SCOPETTES 8  STERILE (MISCELLANEOUS) ×2
SCOPETTES 8 STERILE (MISCELLANEOUS) ×2 IMPLANT
SET TUBING HYSTEROSCOPY 2 NDL (TUBING) ×1 IMPLANT
TOWEL OR 17X24 6PK STRL BLUE (TOWEL DISPOSABLE) ×4 IMPLANT
TUBE HYSTEROSCOPY W Y-CONNECT (TUBING) ×1 IMPLANT
TUBING SMOKE EVAC HOSE ADAPTER (MISCELLANEOUS) ×2 IMPLANT
WATER STERILE IRR 1000ML POUR (IV SOLUTION) ×2 IMPLANT

## 2013-09-23 NOTE — Anesthesia Postprocedure Evaluation (Signed)
  Anesthesia Post Note  Patient: Regina Schultz  Procedure(s) Performed: Procedure(s) (LRB): DILATATION & CURETTAGE/HYSTEROSCOPY WITH RESECTOCOPE (N/A) CO2 LASER APPLICATION OF VAGINAL AND VULVAR DYSPLASIA  (N/A)  Anesthesia type: GA  Patient location: PACU  Post pain: Pain level controlled  Post assessment: Post-op Vital signs reviewed  Last Vitals:  Filed Vitals:   09/23/13 1230  BP:   Pulse: 58  Temp:   Resp: 14    Post vital signs: Reviewed  Level of consciousness: sedated  Complications: No apparent anesthesia complications

## 2013-09-23 NOTE — Op Note (Signed)
NAMECASSEY, Regina Schultz NO.:  [id number]  MEDICAL RECORD NO.:  [id number]  LOCATION:  [facility]                          FACILITY:  [facility]  PHYSICIAN:  Lenard Galloway, M.D.   DATE OF BIRTH:  05/31/1965  DATE OF PROCEDURE:  09/23/2013 DATE OF DISCHARGE:                              OPERATIVE REPORT   PREOPERATIVE DIAGNOSES:  Endometrial thickening, cervical stenosis, history of breast cancer-current tamoxifen patient, VAIN1, VIN1.  POSTOPERATIVE DIAGNOSES:  Endometrial thickening, cervical stenosis, history of breast cancer-current tamoxifen therapy, vulvar condyloma.  PROCEDURES:  Hysteroscopy with dilation and curettage and removal of endometrial polyp, perineal biopsy.  ANESTHESIA:  LMA.  IV FLUIDS:  500 mL Ringer's lactate.  ESTIMATED BLOOD LOSS:  10 mL.  URINE OUTPUT:  50 mL.  GLYCINE DEFICIT:  50 mL.  COMPLICATIONS:  None.  INDICATIONS FOR PROCEDURE:  The patient is a 48 year old gravida 20, para 2 Caucasian female with a history of breast cancer and on current tamoxifen treatment who presented with an episode of postmenopausal bleeding.  The patient had an ultrasound showing thickened endometrium measuring 14.28 mm and this was irregular.  Cervical stenosis was countered in the office with an attempt to do an endometrial biopsy, and the biopsy was therefore successful.  The patient also has recently had a new onset of significant condylomatous lesions of the vulva and the vagina which on biopsy documented VIN1 and VAIN1.  The patient does have a history of DES exposure and a resultant T shaped uterus along with preterm delivery.  The patient's Pap smear in February, 2015 was negative for squamous intraepithelial lesion and negative for high-risk HPV.  She does have a history of a prior LEEP procedure in 2008 for CIN 2.  The plan is made at this time to proceed with a hysteroscopy with expected polypectomy along with dilation and curettage and CO2  laser ablation of vaginal and vulvar dysplasia.  Risks, benefits, and alternatives have been reviewed with the patient who wishes to proceed.  FINDINGS:  Examination under anesthesia revealed a small mid position mobile uterus.  No adnexal masses were appreciated.  Hysteroscopy demonstrated a narrow T-shaped uterine cavity with a polypoid lesion in the lower uterine segment which measured 5 mm in size.  The tubal ostial regions were unremarkable.  The T-shape of the uterus was confirmed with hysteroscopy.  Colposcopic examination of the vagina demonstrated no further condylomatous lesions which had been previously present as a large plaque along the anterior vaginal wall.  On the perineal body, there were two 3-mm condylomatous lesions.  PROCEDURE IN DETAIL:  The patient was reidentified in the preoperative hold area.  She did receive Ancef 2 g IV for antibiotic prophylaxis and she received both TED hose and PAS stockings for DVT prophylaxis.  In the operating room, the patient received an LMA anesthetic and she was then placed in the dorsal lithotomy position.  The lower abdomen, vagina, and perineum were sterilely prepped, and she was catheterized of urine with a red rubber catheter.  She was then sterilely draped.  An examination under anesthesia was performed.  A speculum was placed inside the vagina and a single-tooth tenaculum  was placed on the anterior cervical lip.  An os binder was used to identify the cervical os and this was successful.  The uterus was then sounded to 6 cm in the midline, with the uterine sound, it was possible to confirm the T-shape of the uterus.  The cervix was then further dilated to a #29 Pratt dilator and the hysteroscope was inserted under the continuous infusion of glycine.  The intrauterine findings are as noted above.  A serrated curette was then used to curette the endometrium and all 4 quadrants such that it had a gritty texture to it.   The endometrial polyp was successfully removed with the curetting.  The hysteroscope was inserted again under the continuous infusion of glycine, and this confirmed the absence of the polyp.  The hysteroscope was withdrawn.  The tenaculum was taken off the anterior cervical lip.  This did tear the tissues slightly along the vaginal mucosa, although this was not bleeding and therefore not sutured.  Acetic acid soaked Q-Tips were then placed in the vagina and colposcopy was performed.  No condylomatous or acetowhite lesions were appreciated. The vulva was similarly examined, and there were 2 small condylomatous lesions of the perineal body which were sharply excised with a scalpel. The biopsy sites were then cauterized with a silver nitrate stick. Hemostasis was excellent.  This concluded the patient's procedure.  There were no complications. All needle, sponge, and instrument counts were correct.  The patient was awakened and escorted to the recovery room in good condition.     Lenard Galloway, M.D.     BES/MEDQ  D:  09/23/2013  T:  09/23/2013  Job:  [id number]

## 2013-09-23 NOTE — Progress Notes (Signed)
Update to history and physical  No marked change in status.  Notes some odor to the urine a couple of days ago, not now.  Denies dysuria or urgency.   Patient examined.   OK to proceed with surgery.

## 2013-09-23 NOTE — Discharge Instructions (Addendum)
Regina Schultz,  I did not need to do any laser treatment.  I did a couple of biopsies of the perineal body, the skin between the vagina and the rectum.  It looks like your body healed very well on it's own! I did the hysteroscopy and removed a very small polyp.   All in all, you did very well.  Josefa Half, MD  Hysteroscopy, Care After Refer to this sheet in the next few weeks. These instructions provide you with information on caring for yourself after your procedure. Your health care provider may also give you more specific instructions. Your treatment has been planned according to current medical practices, but problems sometimes occur. Call your health care provider if you have any problems or questions after your procedure.  WHAT TO EXPECT AFTER THE PROCEDURE After your procedure, it is typical to have the following:  You may have some cramping. This normally lasts for a couple days.  You may have bleeding. This can vary from light spotting for a few days to menstrual-like bleeding for 3-7 days. HOME CARE INSTRUCTIONS  Rest for the first 1-2 days after the procedure.  Only take over-the-counter or prescription medicines as directed by your health care provider. Do not take aspirin. It can increase the chances of bleeding.  Take showers instead of baths for 2 weeks or as directed by your health care provider.  Do not drive for 24 hours or as directed.  Do not drink alcohol while taking pain medicine.  Do not use tampons, douche, or have sexual intercourse for 2 weeks or until your health care provider says it is okay.  Take your temperature twice a day for 4-5 days. Write it down each time.  Follow your health care provider's advice about diet, exercise, and lifting.  If you develop constipation, you may:  Take a mild laxative if your health care provider approves.  Add bran foods to your diet.  Drink enough fluids to keep your urine clear or pale yellow.  Try to have someone  with you or available to you for the first 24-48 hours, especially if you were given a general anesthetic.  Follow up with your health care provider as directed. SEEK MEDICAL CARE IF:  You feel dizzy or lightheaded.  You feel sick to your stomach (nauseous).  You have abnormal vaginal discharge.  You have a rash.  You have pain that is not controlled with medicine. SEEK IMMEDIATE MEDICAL CARE IF:  You have bleeding that is heavier than a normal menstrual period.  You have a fever.  You have increasing cramps or pain, not controlled with medicine.  You have new belly (abdominal) pain.  You pass out.  You have pain in the tops of your shoulders (shoulder strap areas).  You have shortness of breath. Document Released: 12/11/2012 Document Reviewed: 12/11/2012 [facility] Patient Information 2015 [place], [place]. This information is not intended to replace advice given to you by your health care provider. Make sure you discuss any questions you have with your health care provider.      DO NOT TAKE ANY IBUPROFEN PRODUCTS UNTIL 6 PM TONIGHT.  YOU RECEIVED A SIMILAR MEDICATION AT 12 NOON THE THE OR TODAY.

## 2013-09-23 NOTE — Brief Op Note (Addendum)
09/23/2013  11:58 AM  PATIENT:  Regina Schultz  48 y.o. female  PRE-OPERATIVE DIAGNOSIS:  endometrial thickening, cervical stenosis, Tamoxifen patient,  vaginal and vulvar dysplasia  POST-OPERATIVE DIAGNOSIS:  endometrial thickening, cervical stenosis, Tamoxifen patient, vulvar condyloma  PROCEDURE:  Hysteroscopy with dilation and curettage and removal of endometrial polyp, perineal biopsies. SURGEON:  Surgeon(s) and Role:    * Spurgeon Gancarz E Amundson de Berton Lan, MD - Primary  PHYSICIAN ASSISTANT:   ASSISTANTS: none   ANESTHESIA:  LMA.  EBL:  Total I/O In: 500 [I.V.:500] Out: 80 [Urine:50; Blood:10]  BLOOD ADMINISTERED:none  DRAINS: none   LOCAL MEDICATIONS USED:  NONE  SPECIMEN:  Source of Specimen:   endometrial curettings with polyp, perineal biopsies  DISPOSITION OF SPECIMEN:  PATHOLOGY  COUNTS:  YES  TOURNIQUET:  * No tourniquets in log *  DICTATION: .Other Dictation: Dictation Number    PLAN OF CARE: Discharge to home after PACU  PATIENT DISPOSITION:  PACU - hemodynamically stable.   Delay start of Pharmacological VTE agent (>24hrs) due to surgical blood loss or risk of bleeding: not applicable

## 2013-09-23 NOTE — Transfer of Care (Signed)
Immediate Anesthesia Transfer of Care Note  Patient: Regina Schultz  Procedure(s) Performed: Procedure(s): DILATATION & CURETTAGE/HYSTEROSCOPY WITH RESECTOCOPE (N/A) CO2 LASER APPLICATION OF VAGINAL AND VULVAR DYSPLASIA  (N/A)  Patient Location: PACU  Anesthesia Type:General  Level of Consciousness: sedated  Airway & Oxygen Therapy: Patient Spontanous Breathing and Patient connected to nasal cannula oxygen  Post-op Assessment: Report given to PACU RN and Post -op Vital signs reviewed and stable  Post vital signs: stable  Complications: No apparent anesthesia complications

## 2013-09-23 NOTE — Anesthesia Preprocedure Evaluation (Signed)

## 2013-09-24 ENCOUNTER — Encounter (HOSPITAL_COMMUNITY): Payer: Self-pay | Admitting: Obstetrics and Gynecology

## 2013-09-26 ENCOUNTER — Telehealth: Payer: Self-pay | Admitting: Obstetrics and Gynecology

## 2013-09-26 NOTE — Telephone Encounter (Signed)
Nothing per vagina for 2 weeks.  I prefer patient not submerge in pool or bath for 1 week.

## 2013-09-26 NOTE — Telephone Encounter (Signed)
Patient had surgery Tuesday 09/23/13 and has some questions regarding post op limitations.

## 2013-09-26 NOTE — Telephone Encounter (Signed)
Spoke with patient. Advised of message as seen below from Dr.Silva. Patient is agreeable and verbalizes understanding.  Routing to provider for final review. Patient agreeable to disposition. Will close encounter.  

## 2013-09-26 NOTE — Telephone Encounter (Signed)
Spoke with patient. Patient states that she has returned to work but would like to know what kind of exercising she can do. "Am I able to get in a pool and return to exercising?" Advised patient that returning to light exercise should not be a problem. Advised to stay away from any heavy lifting with weights or squatting with weight. Advised swimming and light exercise like walking should be okay. Advised not to over exert herself and to slowly build up exercise. Patient is agreeable. Advised if any further recommendations  will give patient a call back. Results given to patient as seen below. Patient is agreeable and verbalizes understanding.  Please give patient the good news of a benign endometrial polyp on her D&C specimen. The biopsies of her perineal body showed straight forward condyloma.  Dr.Silva, any further recommendations for patient at this time? Routing to Dr.Lathrop as well as Dr.Silva is out of the office today.

## 2013-10-06 ENCOUNTER — Other Ambulatory Visit: Payer: Self-pay

## 2013-10-06 NOTE — Telephone Encounter (Signed)
Last AEX: 04/28/13 Last refill:Celexa 09/10/13 #30, 11 ref wellbutrin XL ordered on 09/09/13 Current AEX: 05/01/14  The refill request is for a home delivery service.  Please advise

## 2013-10-08 ENCOUNTER — Ambulatory Visit: Payer: 59 | Admitting: Obstetrics and Gynecology

## 2013-10-08 ENCOUNTER — Ambulatory Visit (INDEPENDENT_AMBULATORY_CARE_PROVIDER_SITE_OTHER): Payer: 59 | Admitting: Obstetrics and Gynecology

## 2013-10-08 ENCOUNTER — Encounter: Payer: Self-pay | Admitting: Obstetrics and Gynecology

## 2013-10-08 VITALS — BP 110/70 | HR 80 | Resp 16 | Ht 63.5 in | Wt 126.8 lb

## 2013-10-08 DIAGNOSIS — N840 Polyp of corpus uteri: Secondary | ICD-10-CM

## 2013-10-08 DIAGNOSIS — Z9889 Other specified postprocedural states: Secondary | ICD-10-CM

## 2013-10-08 DIAGNOSIS — A630 Anogenital (venereal) warts: Secondary | ICD-10-CM

## 2013-10-08 MED ORDER — CITALOPRAM HYDROBROMIDE 20 MG PO TABS: 20.0000 mg | ORAL_TABLET | Freq: Every day | ORAL | Status: DC

## 2013-10-08 NOTE — Patient Instructions (Addendum)
Call for recurrent vaginal bleeding or recurrent condyloma.   Smoking Cessation Quitting smoking is important to your health and has many advantages. However, it is not always easy to quit since nicotine is a very addictive drug. Oftentimes, people try 3 times or more before being able to quit. This document explains the best ways for you to prepare to quit smoking. Quitting takes hard work and a lot of effort, but you can do it. ADVANTAGES OF QUITTING SMOKING  You will live longer, feel better, and live better.  Your body will feel the impact of quitting smoking almost immediately.  Within 20 minutes, blood pressure decreases. Your pulse returns to its normal level.  After 8 hours, carbon monoxide levels in the blood return to normal. Your oxygen level increases.  After 24 hours, the chance of having a heart attack starts to decrease. Your breath, hair, and body stop smelling like smoke.  After 48 hours, damaged nerve endings begin to recover. Your sense of taste and smell improve.  After 72 hours, the body is virtually free of nicotine. Your bronchial tubes relax and breathing becomes easier.  After 2 to 12 weeks, lungs can hold more air. Exercise becomes easier and circulation improves.  The risk of having a heart attack, stroke, cancer, or lung disease is greatly reduced.  After 1 year, the risk of coronary heart disease is cut in half.  After 5 years, the risk of stroke falls to the same as a nonsmoker.  After 10 years, the risk of lung cancer is cut in half and the risk of other cancers decreases significantly.  After 15 years, the risk of coronary heart disease drops, usually to the level of a nonsmoker.  If you are pregnant, quitting smoking will improve your chances of having a healthy baby.  The people you live with, especially any children, will be healthier.  You will have extra money to spend on things other than cigarettes. QUESTIONS TO THINK ABOUT BEFORE ATTEMPTING  TO QUIT You may want to talk about your answers with your health care provider.  Why do you want to quit?  If you tried to quit in the past, what helped and what did not?  What will be the most difficult situations for you after you quit? How will you plan to handle them?  Who can help you through the tough times? Your family? Friends? A health care provider?  What pleasures do you get from smoking? What ways can you still get pleasure if you quit? Here are some questions to ask your health care provider:  How can you help me to be successful at quitting?  What medicine do you think would be best for me and how should I take it?  What should I do if I need more help?  What is smoking withdrawal like? How can I get information on withdrawal? GET READY  Set a quit date.  Change your environment by getting rid of all cigarettes, ashtrays, matches, and lighters in your home, car, or work. Do not let people smoke in your home.  Review your past attempts to quit. Think about what worked and what did not. GET SUPPORT AND ENCOURAGEMENT You have a better chance of being successful if you have help. You can get support in many ways.  Tell your family, friends, and coworkers that you are going to quit and need their support. Ask them not to smoke around you.  Get individual, group, or telephone counseling and support.  Programs are available at [employer] and health centers. Call your local health department for information about programs in your area.  Spiritual beliefs and practices may help some smokers quit.  Download a "quit meter" on your computer to keep track of quit statistics, such as how long you have gone without smoking, cigarettes not smoked, and money saved.  Get a self-help book about quitting smoking and staying off tobacco. [place] yourself from urges to smoke. Talk to someone, go for a walk, or occupy your time with a  task.  Change your normal routine. Take a different route to work. Drink tea instead of coffee. Eat breakfast in a different place.  Reduce your stress. Take a hot bath, exercise, or read a book.  Plan something enjoyable to do every day. Reward yourself for not smoking.  Explore interactive web-based programs that specialize in helping you quit. GET MEDICINE AND USE IT CORRECTLY Medicines can help you stop smoking and decrease the urge to smoke. Combining medicine with the above behavioral methods and support can greatly increase your chances of successfully quitting smoking.  Nicotine replacement therapy helps deliver nicotine to your body without the negative effects and risks of smoking. Nicotine replacement therapy includes nicotine gum, lozenges, inhalers, nasal sprays, and skin patches. Some may be available over-the-counter and others require a prescription.  Antidepressant medicine helps people abstain from smoking, but how this works is unknown. This medicine is available by prescription.  Nicotinic receptor partial agonist medicine simulates the effect of nicotine in your brain. This medicine is available by prescription. Ask your health care provider for advice about which medicines to use and how to use them based on your health history. Your health care provider will tell you what side effects to look out for if you choose to be on a medicine or therapy. Carefully read the information on the package. Do not use any other product containing nicotine while using a nicotine replacement product.  RELAPSE OR DIFFICULT SITUATIONS Most relapses occur within the first 3 months after quitting. Do not be discouraged if you start smoking again. Remember, most people try several times before finally quitting. You may have symptoms of withdrawal because your body is used to nicotine. You may crave cigarettes, be irritable, feel very hungry, cough often, get headaches, or have difficulty  concentrating. The withdrawal symptoms are only temporary. They are strongest when you first quit, but they will go away within 10-14 days. To reduce the chances of relapse, try to:  Avoid drinking alcohol. Drinking lowers your chances of successfully quitting.  Reduce the amount of caffeine you consume. Once you quit smoking, the amount of caffeine in your body increases and can give you symptoms, such as a rapid heartbeat, sweating, and anxiety.  Avoid smokers because they can make you want to smoke.  Do not let weight gain distract you. Many smokers will gain weight when they quit, usually less than 10 pounds. Eat a healthy diet and stay active. You can always lose the weight gained after you quit.  Find ways to improve your mood other than smoking. FOR MORE INFORMATION  www.smokefree.gov  Document Released: 02/14/2001 Document Revised: 07/07/2013 Document Reviewed: 06/01/2011 [facility] Patient Information 2015 [place], [place]. This information is not intended to replace advice given to you by your health care provider. Make sure you discuss any questions you have with your health care provider.
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
## 2013-10-13 ENCOUNTER — Other Ambulatory Visit: Payer: Self-pay | Admitting: Nurse Practitioner

## 2013-10-13 ENCOUNTER — Other Ambulatory Visit: Payer: Self-pay | Admitting: *Deleted

## 2013-10-13 DIAGNOSIS — C50919 Malignant neoplasm of unspecified site of unspecified female breast: Secondary | ICD-10-CM

## 2013-10-13 MED ORDER — TAMOXIFEN CITRATE 20 MG PO TABS: 20.0000 mg | ORAL_TABLET | Freq: Every day | ORAL | Status: DC

## 2013-11-12 ENCOUNTER — Other Ambulatory Visit: Payer: Self-pay | Admitting: Obstetrics and Gynecology

## 2013-11-12 NOTE — Telephone Encounter (Signed)
Last refilled: 10/12/12 #30/5 refills  Last AEX: 04/28/13 with Dr. Leretha Dykes Scheduled: 05/01/14 with Dr. Quincy Simmonds  Please Advise.

## 2013-11-12 NOTE — Telephone Encounter (Signed)
Patient wanting to make sure that the rx was being taken care of assured her that it has been approved and sent by Dr. Quincy Simmonds to her [facility].

## 2013-11-12 NOTE — Telephone Encounter (Signed)
Pt wants to talk with the nurse concerning a prescription.

## 2013-11-19 ENCOUNTER — Telehealth: Payer: Self-pay | Admitting: Emergency Medicine

## 2013-11-19 ENCOUNTER — Encounter: Payer: Self-pay | Admitting: Obstetrics and Gynecology

## 2013-11-19 ENCOUNTER — Ambulatory Visit (INDEPENDENT_AMBULATORY_CARE_PROVIDER_SITE_OTHER): Payer: 59 | Admitting: Obstetrics and Gynecology

## 2013-11-19 VITALS — BP 122/70 | HR 76 | Ht 63.5 in | Wt 131.2 lb

## 2013-11-19 DIAGNOSIS — R82998 Other abnormal findings in urine: Secondary | ICD-10-CM

## 2013-11-19 DIAGNOSIS — B9689 Other specified bacterial agents as the cause of diseases classified elsewhere: Secondary | ICD-10-CM

## 2013-11-19 DIAGNOSIS — A499 Bacterial infection, unspecified: Secondary | ICD-10-CM

## 2013-11-19 DIAGNOSIS — N760 Acute vaginitis: Secondary | ICD-10-CM

## 2013-11-19 DIAGNOSIS — N390 Urinary tract infection, site not specified: Secondary | ICD-10-CM

## 2013-11-19 DIAGNOSIS — R8290 Unspecified abnormal findings in urine: Secondary | ICD-10-CM

## 2013-11-19 LAB — POCT URINALYSIS DIPSTICK
BILIRUBIN UA: NEGATIVE
Blood, UA: NEGATIVE
GLUCOSE UA: NEGATIVE
KETONES UA: NEGATIVE
Nitrite, UA: NEGATIVE
Protein, UA: NEGATIVE
Urobilinogen, UA: NEGATIVE
pH, UA: 5

## 2013-11-19 MED ORDER — FLUCONAZOLE 150 MG PO TABS: 150.0000 mg | ORAL_TABLET | Freq: Once | ORAL | Status: DC

## 2013-11-19 MED ORDER — METRONIDAZOLE 500 MG PO TABS: 500.0000 mg | ORAL_TABLET | Freq: Two times a day (BID) | ORAL | Status: DC

## 2013-11-19 MED ORDER — CIPROFLOXACIN HCL 500 MG PO TABS: 500.0000 mg | ORAL_TABLET | Freq: Two times a day (BID) | ORAL | Status: DC

## 2013-11-19 NOTE — Telephone Encounter (Signed)
Will close this medication/pharmacy encounter and open new phone note for telephone triage of UTI.

## 2013-11-19 NOTE — Patient Instructions (Signed)
Bacterial Vaginosis Bacterial vaginosis is a vaginal infection that occurs when the normal balance of bacteria in the vagina is disrupted. It results from an overgrowth of certain bacteria. This is the most common vaginal infection in women of childbearing age. Treatment is important to prevent complications, especially in pregnant women, as it can cause a premature delivery. CAUSES  Bacterial vaginosis is caused by an increase in harmful bacteria that are normally present in smaller amounts in the vagina. Several different kinds of bacteria can cause bacterial vaginosis. However, the reason that the condition develops is not fully understood. RISK FACTORS Certain activities or behaviors can put you at an increased risk of developing bacterial vaginosis, including:  Having a new sex partner or multiple sex partners.  Douching.  Using an intrauterine device (IUD) for contraception. Women do not get bacterial vaginosis from toilet seats, bedding, swimming pools, or contact with objects around them. SIGNS AND SYMPTOMS  Some women with bacterial vaginosis have no signs or symptoms. Common symptoms include:  Grey vaginal discharge.  A fishlike odor with discharge, especially after sexual intercourse.  Itching or burning of the vagina and vulva.  Burning or pain with urination. DIAGNOSIS  Your health care provider will take a medical history and examine the vagina for signs of bacterial vaginosis. A sample of vaginal fluid may be taken. Your health care provider will look at this sample under a microscope to check for bacteria and abnormal cells. A vaginal pH test may also be done.  TREATMENT  Bacterial vaginosis may be treated with antibiotic medicines. These may be given in the form of a pill or a vaginal cream. A second round of antibiotics may be prescribed if the condition comes back after treatment.  HOME CARE INSTRUCTIONS   Only take over-the-counter or prescription medicines as  directed by your health care provider.  If antibiotic medicine was prescribed, take it as directed. Make sure you finish it even if you start to feel better.  Do not have sex until treatment is completed.  Tell all sexual partners that you have a vaginal infection. They should see their health care provider and be treated if they have problems, such as a mild rash or itching.  Practice safe sex by using condoms and only having one sex partner. SEEK MEDICAL CARE IF:   Your symptoms are not improving after 3 days of treatment.  You have increased discharge or pain.  You have a fever. MAKE SURE YOU:   Understand these instructions.  Will watch your condition.  Will get help right away if you are not doing well or get worse. FOR MORE INFORMATION  Centers for Disease Control and Prevention, Division of STD Prevention: www.cdc.gov/std American Sexual Health Association (ASHA): www.ashastd.org  Document Released: 02/20/2005 Document Revised: 12/11/2012 Document Reviewed: 10/02/2012 [employer] Patient Information 2015 [employer]. This information is not intended to replace advice given to you by your health care provider. Make sure you discuss any questions you have with your health care provider.  

## 2013-11-19 NOTE — Telephone Encounter (Signed)
Pt has a UTI and wants to know if she can have something called into the pharmacy.I did offer an appointment but she declined.
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
## 2013-11-19 NOTE — Telephone Encounter (Signed)
Regina Schultz at 11/19/2013 11:12 AM     Status: Signed        Pt has a UTI and wants to know if she can have something called into the pharmacy.I did offer an appointment but she declined.

## 2013-11-19 NOTE — Telephone Encounter (Signed)
Spoke with patient. She started having urine odor last night. Feels this is like a UTI symptom for her. No fevers, flank pain or dysuria.  Advised patient will need office visit for evaluation as has prior hx of E-coli positive urine cultures.  Patient added on in triage appointment for 1300 today with Dr. Quincy Simmonds. Routing to provider for final review. Patient agreeable to disposition. Will close encounter

## 2013-11-19 NOTE — Telephone Encounter (Signed)
Patient needs triage and visit for evaluation of uti symptoms. Message left to return call to [place] at [phone].

## 2013-11-21 LAB — URINE CULTURE: Colony Count: 50000

## 2013-11-28 ENCOUNTER — Ambulatory Visit: Payer: 59 | Admitting: Obstetrics and Gynecology

## 2013-11-28 ENCOUNTER — Telehealth: Payer: Self-pay | Admitting: Obstetrics and Gynecology

## 2013-11-28 NOTE — Telephone Encounter (Signed)
Pt canceled her appointment for today. She has a sick child at home. She needs an appointment for a 10 day recheck and there is no availble slots with Dr. Quincy Simmonds. Is it ok to waive the [facility] FEE?

## 2013-11-28 NOTE — Telephone Encounter (Signed)
Return call to patient. Recheck scheduled for Monday 12-01-13 at 1245. Encounter closed.

## 2013-11-28 NOTE — Telephone Encounter (Signed)
Returning a call to Kaitlyn. °

## 2013-11-28 NOTE — Telephone Encounter (Signed)
Waive [facility] fee.  Squeeze the patient in maybe at the beginning of a new patient slot for me.  This patient will need to be seen.  Contact Gay Filler if a slot needs opening.

## 2013-11-28 NOTE — Telephone Encounter (Addendum)
Left message to call [place] at [phone].  Per Gay Filler can have one of Dr.Silva's blocked appointments next week.

## 2013-12-01 ENCOUNTER — Encounter: Payer: Self-pay | Admitting: Obstetrics and Gynecology

## 2013-12-01 ENCOUNTER — Ambulatory Visit (INDEPENDENT_AMBULATORY_CARE_PROVIDER_SITE_OTHER): Payer: 59 | Admitting: Obstetrics and Gynecology

## 2013-12-01 VITALS — BP 110/80 | HR 60 | Temp 99.1°F | Resp 16 | Ht 63.5 in | Wt 129.0 lb

## 2013-12-01 DIAGNOSIS — N390 Urinary tract infection, site not specified: Secondary | ICD-10-CM

## 2013-12-01 LAB — POCT URINALYSIS DIPSTICK
Bilirubin, UA: NEGATIVE
Blood, UA: NEGATIVE
Glucose, UA: NEGATIVE
Ketones, UA: NEGATIVE
Leukocytes, UA: NEGATIVE
NITRITE UA: NEGATIVE
PH UA: 6
PROTEIN UA: NEGATIVE
Urobilinogen, UA: NEGATIVE

## 2013-12-01 NOTE — Patient Instructions (Signed)
We will contact you when the urine culture results return.
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
## 2013-12-02 LAB — URINE CULTURE

## 2013-12-05 ENCOUNTER — Telehealth: Payer: Self-pay

## 2013-12-05 NOTE — Telephone Encounter (Signed)
Message copied by [name] on Fri Dec 05, 2013 12:31 PM ------      Message from: [place], BROOK E      Created: Fri Dec 05, 2013  7:11 AM       Please report negative urine culture.       The report showed mixed bacteria, but no one organism that grew.      Therefore, this is a negative culture.       Call for any symptoms of urinary odor or pain with urination.       Thanks! ------

## 2013-12-05 NOTE — Telephone Encounter (Signed)
Patient notified of results.

## 2013-12-05 NOTE — Telephone Encounter (Signed)
Pt is calling amanda back.

## 2013-12-05 NOTE — Telephone Encounter (Signed)
Called patient at [phone] to discuss urine culture results.  LMOVM to call me.

## 2013-12-11 ENCOUNTER — Telehealth: Payer: Self-pay | Admitting: Obstetrics and Gynecology

## 2013-12-11 NOTE — Telephone Encounter (Signed)
LMTCB about canceled appointment with Dr. Quincy Simmonds.

## 2014-01-05 ENCOUNTER — Encounter: Payer: Self-pay | Admitting: Obstetrics and Gynecology

## 2014-01-14 ENCOUNTER — Other Ambulatory Visit: Payer: Self-pay | Admitting: Nurse Practitioner

## 2014-01-14 DIAGNOSIS — Z853 Personal history of malignant neoplasm of breast: Secondary | ICD-10-CM

## 2014-01-15 ENCOUNTER — Other Ambulatory Visit (HOSPITAL_BASED_OUTPATIENT_CLINIC_OR_DEPARTMENT_OTHER): Payer: 59

## 2014-01-15 DIAGNOSIS — Z853 Personal history of malignant neoplasm of breast: Secondary | ICD-10-CM

## 2014-01-15 LAB — CBC WITH DIFFERENTIAL/PLATELET
BASO%: 1 % (ref 0.0–2.0)
Basophils Absolute: 0.1 10*3/uL (ref 0.0–0.1)
EOS%: 1.8 % (ref 0.0–7.0)
Eosinophils Absolute: 0.1 10*3/uL (ref 0.0–0.5)
HCT: 40.1 % (ref 34.8–46.6)
HEMOGLOBIN: 13.5 g/dL (ref 11.6–15.9)
LYMPH%: 32.3 % (ref 14.0–49.7)
MCH: 31.3 pg (ref 25.1–34.0)
MCHC: 33.7 g/dL (ref 31.5–36.0)
MCV: 92.8 fL (ref 79.5–101.0)
MONO#: 0.6 10*3/uL (ref 0.1–0.9)
MONO%: 10.4 % (ref 0.0–14.0)
NEUT#: 3.4 10*3/uL (ref 1.5–6.5)
NEUT%: 54.5 % (ref 38.4–76.8)
Platelets: 263 10*3/uL (ref 145–400)
RBC: 4.32 10*6/uL (ref 3.70–5.45)
RDW: 12.3 % (ref 11.2–14.5)
WBC: 6.2 10*3/uL (ref 3.9–10.3)
lymph#: 2 10*3/uL (ref 0.9–3.3)

## 2014-01-15 LAB — COMPREHENSIVE METABOLIC PANEL (CC13)
ALK PHOS: 72 U/L (ref 40–150)
ALT: 17 U/L (ref 0–55)
AST: 19 U/L (ref 5–34)
Albumin: 4 g/dL (ref 3.5–5.0)
Anion Gap: 7 mEq/L (ref 3–11)
BUN: 9.5 mg/dL (ref 7.0–26.0)
CO2: 28 mEq/L (ref 22–29)
CREATININE: 0.8 mg/dL (ref 0.6–1.1)
Calcium: 9.2 mg/dL (ref 8.4–10.4)
Chloride: 103 mEq/L (ref 98–109)
Glucose: 83 mg/dl (ref 70–140)
Potassium: 4.1 mEq/L (ref 3.5–5.1)
Sodium: 138 mEq/L (ref 136–145)
Total Bilirubin: 0.42 mg/dL (ref 0.20–1.20)
Total Protein: 7 g/dL (ref 6.4–8.3)

## 2014-01-22 ENCOUNTER — Ambulatory Visit (HOSPITAL_BASED_OUTPATIENT_CLINIC_OR_DEPARTMENT_OTHER): Payer: 59 | Admitting: Nurse Practitioner

## 2014-01-22 ENCOUNTER — Telehealth: Payer: Self-pay | Admitting: Oncology

## 2014-01-22 ENCOUNTER — Encounter: Payer: Self-pay | Admitting: Nurse Practitioner

## 2014-01-22 VITALS — BP 115/65 | HR 62 | Temp 98.7°F | Resp 18 | Ht 63.5 in | Wt 134.2 lb

## 2014-01-22 DIAGNOSIS — Z853 Personal history of malignant neoplasm of breast: Secondary | ICD-10-CM

## 2014-01-22 DIAGNOSIS — Z170 Estrogen receptor positive status [ER+]: Secondary | ICD-10-CM

## 2014-01-22 DIAGNOSIS — C50812 Malignant neoplasm of overlapping sites of left female breast: Secondary | ICD-10-CM

## 2014-01-22 NOTE — Telephone Encounter (Signed)
s.w. pt and advised on NOV 2016 appt.Marland KitchenMarland KitchenMarland KitchenMarland Kitchenpt ok and aware
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
## 2014-03-06 HISTORY — PX: BREAST RECONSTRUCTION: SHX9

## 2014-03-17 ENCOUNTER — Ambulatory Visit (INDEPENDENT_AMBULATORY_CARE_PROVIDER_SITE_OTHER): Payer: 59 | Admitting: Nurse Practitioner

## 2014-03-17 ENCOUNTER — Telehealth: Payer: Self-pay | Admitting: Obstetrics and Gynecology

## 2014-03-17 ENCOUNTER — Encounter: Payer: Self-pay | Admitting: Nurse Practitioner

## 2014-03-17 VITALS — BP 110/70 | HR 74 | Ht 63.5 in | Wt 138.0 lb

## 2014-03-17 DIAGNOSIS — L723 Sebaceous cyst: Secondary | ICD-10-CM

## 2014-03-17 NOTE — Telephone Encounter (Signed)
Patient would like an appointment with Dr.Silva. "a knot has come up and would like to see Dr.Silva". Patient last seen 11/21/13.

## 2014-03-17 NOTE — Telephone Encounter (Signed)
Per [place], patient needs a recheck with Dr.Silva this Thursday 03/19/13 or 03/20/13 Friday.. Dr.Silva's schedule is 100% full on both of those days.

## 2014-03-17 NOTE — Telephone Encounter (Signed)
Spoke with patient. Patient states that two weeks ago she noticed a "knot in vaginal area." States knot has not gone away and feels it is increasing in size. No pain associated with knot. Denies warmth or discoloration of area. "I am just worried because I found a knot in my breast like this and it was cancer." Requesting appointment as soon as possible. Offered appointment tomorrow at 10:15am , 11am, or 2 pm with Milford Cage, FNP but patient declines due to work schedule and having to travel from [place]. Requesting appointment today. Appointment scheduled for today at 3:30 pm with Milford Cage, FNP. Advised Dr.Miller will be in the office if Milford Cage, FNP needs anything. Patient is agreeable.  Cc: Dr.Silva Cc: Dr.Miller  Routing to provider for final review. Patient agreeable to disposition. Will close encounter

## 2014-03-17 NOTE — Progress Notes (Signed)
Subjective:     Patient ID: Regina Schultz, female   DOB: Mar 01, 1966, 49 y.o.   MRN: [id number]  HPI   This 49 yo Divorced White female complains of "vaginal bump"  Noted 3 weeks ago without pain or irritation.  She thought is was an infected hair follicle and has tried to squeeze it out.  No application of med's or sitz baths.. No associated pain.  No UA symptoms.  Same partner for 1.5 years.  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  She thinks this is same area that she has laser treatment.  She remains on Tamoxifen for treatment of breast cancer 12/2003.   Review of Systems  HENT: Negative.   Respiratory: Negative.   Cardiovascular: Negative.   Genitourinary: Negative.  Negative for dysuria, urgency, frequency, flank pain, enuresis, difficulty urinating, pelvic pain and dyspareunia.       Amenorrhea on Tamoxifen  Skin: Negative.   Neurological: Negative.   Hematological: Negative.   Psychiatric/Behavioral: Negative.        Objective:   Physical Exam  Constitutional: She is oriented to person, place, and time. She appears well-developed and well-nourished.  Abdominal: Soft. She exhibits no distension. There is no tenderness. There is no rebound and no guarding.  Genitourinary:     Area on right groin that looks and feels like a sebaceous cyst.  She has a smaller area adjacent that could be condyloma.  There is no exudate or pain.  Area is 1 cm size and firm.  Neurological: She is alert and oriented to person, place, and time.  Psychiatric: She has a normal mood and affect. Her behavior is normal. Judgment and thought content normal.       Assessment:     R/O condyloma Most likely sebaceous cyst    Plan:     Will refer her back to Dr. Quincy Simmonds to do biopsy or treatment as needed Since not painful and no exudate - did not give antibiotics.

## 2014-03-19 NOTE — Telephone Encounter (Signed)
Spoke with patient. Follow up scheduled for tomorrow 1/15 at 2:45pm with Dr.Silva. Patient was seen on 1/12 with Milford Cage, FNP for vaginal bump. Please see OV note.  Cc: Milford Cage, FNP   Routing to provider for final review. Patient agreeable to disposition. Will close encounter

## 2014-03-20 ENCOUNTER — Encounter: Payer: Self-pay | Admitting: Obstetrics and Gynecology

## 2014-03-20 ENCOUNTER — Ambulatory Visit (INDEPENDENT_AMBULATORY_CARE_PROVIDER_SITE_OTHER): Payer: 59 | Admitting: Obstetrics and Gynecology

## 2014-03-20 VITALS — BP 116/60 | HR 66 | Ht 63.5 in | Wt 138.0 lb

## 2014-03-20 DIAGNOSIS — N907 Vulvar cyst: Secondary | ICD-10-CM

## 2014-03-20 NOTE — Patient Instructions (Signed)
Epidermal Cyst An epidermal cyst is sometimes called a sebaceous cyst, epidermal inclusion cyst, or infundibular cyst. These cysts usually contain a substance that looks "pasty" or "cheesy" and may have a bad smell. This substance is a protein called keratin. Epidermal cysts are usually found on the face, neck, or trunk. They may also occur in the vaginal area or other parts of the genitalia of both men and women. Epidermal cysts are usually small, painless, slow-growing bumps or lumps that move freely under the skin. It is important not to try to pop them. This may cause an infection and lead to tenderness and swelling. CAUSES  Epidermal cysts may be caused by a deep penetrating injury to the skin or a plugged hair follicle, often associated with acne. SYMPTOMS  Epidermal cysts can become inflamed and cause:  Redness.  Tenderness.  Increased temperature of the skin over the bumps or lumps.  Grayish-white, bad smelling material that drains from the bump or lump. DIAGNOSIS  Epidermal cysts are easily diagnosed by your caregiver during an exam. Rarely, a tissue sample (biopsy) may be taken to rule out other conditions that may resemble epidermal cysts. TREATMENT   Epidermal cysts often get better and disappear on their own. They are rarely ever cancerous.  If a cyst becomes infected, it may become inflamed and tender. This may require opening and draining the cyst. Treatment with antibiotics may be necessary. When the infection is gone, the cyst may be removed with minor surgery.  Small, inflamed cysts can often be treated with antibiotics or by injecting steroid medicines.  Sometimes, epidermal cysts become large and bothersome. If this happens, surgical removal in your caregiver's office may be necessary. HOME CARE INSTRUCTIONS  Only take over-the-counter or prescription medicines as directed by your caregiver.  Take your antibiotics as directed. Finish them even if you start to feel  better. SEEK MEDICAL CARE IF:   Your cyst becomes tender, red, or swollen.  Your condition is not improving or is getting worse.  You have any other questions or concerns. MAKE SURE YOU:  Understand these instructions.  Will watch your condition.  Will get help right away if you are not doing well or get worse. Document Released: 01/22/2004 Document Revised: 05/15/2011 Document Reviewed: 08/29/2010 [employer] Patient Information 2015 [employer]. This information is not intended to replace advice given to you by your health care provider. Make sure you discuss any questions you have with your health care provider.  
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
## 2014-03-22 NOTE — Progress Notes (Signed)
Encounter reviewed by Dr. Mona Ayars Silva.  

## 2014-04-23 ENCOUNTER — Ambulatory Visit (INDEPENDENT_AMBULATORY_CARE_PROVIDER_SITE_OTHER): Payer: 59 | Admitting: Podiatry

## 2014-04-23 VITALS — BP 117/72 | HR 61 | Resp 16 | Ht 64.0 in | Wt 132.0 lb

## 2014-04-23 DIAGNOSIS — L89891 Pressure ulcer of other site, stage 1: Secondary | ICD-10-CM

## 2014-04-23 DIAGNOSIS — L97512 Non-pressure chronic ulcer of other part of right foot with fat layer exposed: Secondary | ICD-10-CM

## 2014-04-23 MED ORDER — CEPHALEXIN 500 MG PO CAPS: 500.0000 mg | ORAL_CAPSULE | Freq: Three times a day (TID) | ORAL | Status: DC

## 2014-04-23 NOTE — Patient Instructions (Signed)
Continue daily dressing changes as discussed Monitor for any signs/symptoms of infection. Call the office immediately if any occur or go directly to the emergency room. Call with any questions/concerns.  

## 2014-04-25 NOTE — Progress Notes (Signed)
Subjective:     Patient ID: Regina Schultz, female   DOB: [birth year], 49 y.o.   MRN: [id number]  HPI 49 year old female presents the office they with complaints of a corn on the inside aspect of her right fifth toe. She states this been ongoing for several weeks. She has recently placed a corn pad over the area to help get rid of it although she states it made the area more tender. She denies any redness around the area or any drainage. She denies any swelling to the toe. Denies any history of injury or trauma. No other complaints at this time.  Review of Systems  All other systems reviewed and are negative.      Objective:   Physical Exam AAO 3, NAD DP/PT pulses palpable, CRT less than 3 seconds Protective sensation intact with Simms Weinstein monofilament, vibratory sensation intact, Achilles tendon reflex intact. On the medial aspect of the right fifth digit there is a small annular hyperkeratotic lesion approximate level of the medial PIPJ. There is surrounding macerated tissue. Upon debridement of the lesion there is an underlying ulceration measuring approximately 0.3 x 0.3 cm with a granular base which probes approximately 0.2 cm. Upon initial debridement there was a small amount of superficial purulence directly underneath the callus. After the area was debrided no further purulence was expressed. There is no probe to bone, undermining, tunneling. There is macerated periwound. There is no swelling erythema, ascending cellulitis.  No areas of fluctuance or crepitus. There is no edema to the digit. There is mild tenderness directly upon palpation of this lesion. There is no lesion on the lateral fourth digit. No other lesions identified bilaterally. No other areas of tenderness to bilateral lower extremities. MMT 5/5, ROM WNL No other open lesions or pre-ulcerative lesions bilaterally. No pain with calf compression, swelling, warmth, erythema.    Assessment:     49 year old female right  fifth digit ulceration    Plan:     -X-rays were obtained and reviewed the patient. -Treatment options were discussed including alternatives, risks, complications. -Lesion was sharply debrided without complications to reveal underlying ulceration. -Recommended the patient to keep the area clean and dry. She did apply small antibiotic ointment over the wound followed by dry sterile dressing. -Prescribed Keflex. Monitor for any signs or symptoms of infection and directed to call the office immediately if any occur or go directly to the emergency room. -Discussed to wear a wide toe box shoe. -Follow-up in 1 week or sooner if any problems are to arise. In the meantime, encouraged to call the office with any questions, concerns, change in symptoms.

## 2014-04-27 ENCOUNTER — Telehealth: Payer: Self-pay | Admitting: Obstetrics and Gynecology

## 2014-04-27 NOTE — Telephone Encounter (Signed)
Patient canceled her upcoming " 6 week reck" appointment. Patient says she no longer needs this appointment. Last seen 03/20/14.

## 2014-04-30 ENCOUNTER — Ambulatory Visit: Payer: 59 | Admitting: Obstetrics and Gynecology

## 2014-04-30 NOTE — Telephone Encounter (Signed)
FYI--Dr. Quincy Simmonds, no further action?  Routed to Dr. Quincy Simmonds.

## 2014-04-30 NOTE — Telephone Encounter (Signed)
No further appointment needed at this time.  This was a recheck more for patient reassurance.  Encounter closed.

## 2014-05-01 ENCOUNTER — Ambulatory Visit: Payer: Self-pay | Admitting: Obstetrics and Gynecology

## 2014-05-05 ENCOUNTER — Ambulatory Visit (INDEPENDENT_AMBULATORY_CARE_PROVIDER_SITE_OTHER): Payer: 59 | Admitting: Podiatry

## 2014-05-05 ENCOUNTER — Encounter: Payer: Self-pay | Admitting: Podiatry

## 2014-05-05 DIAGNOSIS — L89891 Pressure ulcer of other site, stage 1: Secondary | ICD-10-CM

## 2014-05-05 DIAGNOSIS — L97512 Non-pressure chronic ulcer of other part of right foot with fat layer exposed: Secondary | ICD-10-CM

## 2014-05-05 NOTE — Patient Instructions (Signed)
Dry between you toes daily

## 2014-05-05 NOTE — Progress Notes (Signed)
Patient ID: Regina Schultz, female   DOB: 09-Jan-1966, 49 y.o.   MRN: [id number]  Subjective: 49 year old female returns the office for follow valuation of a wound on the right fifth digit. She states that she finished her course of antibiotics. She states that since last appointment the ulcer is healed and she states that "it feels like a new toe". She denies any redness or swelling to the digits or any drainage. Denies any systemic complaints as fevers, chills, nausea, vomiting. No other complaints at this time in no acute changes since last appointment.  Objective:  AAO 3, NAD Neurovascular status unchanged. Ulceration on the medial aspect of the right fifth digit appears to be healed at this time. There is no open lesion identified. There is mild interdigital maceration. There is edema, erythema, ascending cellulitis, fluctuance, crepitus, malodor. there is no tenderness to palpation upon the digits. No pain with MTPJ range of motion. 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  No other open lesions or pre-ulcer lesions identified bilaterally. No pain with calf compression, swelling, warmth, erythema.  Assessment: 49 year old female with healed right fifth toe ulceration; mild interdigital maceration  Plan: -Treatment options were discussed including alternatives, risks, complications. -Castellani's pain was applied Interdigitally. -Recommended the patient to dry thoroughly between the toes after showering/daily -Monitor for any recurrence of symptoms. If the corn reoccurs to call the office. -Discussed possible surgical intervention to straighten the toes to help prevent the pressure in the recurrence of corn. Will continue conservative treatments at this time. -Follow-up as needed. In the meantime, encouraged to call the office with any question, concerns, change in symptoms.

## 2014-06-12 ENCOUNTER — Telehealth: Payer: Self-pay | Admitting: *Deleted

## 2014-06-12 MED ORDER — CEPHALEXIN 500 MG PO CAPS: 500.0000 mg | ORAL_CAPSULE | Freq: Three times a day (TID) | ORAL | Status: DC

## 2014-06-12 NOTE — Telephone Encounter (Signed)
Refill keflex and follow up next week. Ok per dr Jacqualyn Posey

## 2014-06-23 ENCOUNTER — Ambulatory Visit: Payer: 59 | Admitting: Podiatry

## 2014-07-02 ENCOUNTER — Ambulatory Visit (INDEPENDENT_AMBULATORY_CARE_PROVIDER_SITE_OTHER): Payer: 59 | Admitting: Podiatry

## 2014-07-02 DIAGNOSIS — M79676 Pain in unspecified toe(s): Secondary | ICD-10-CM

## 2014-07-02 DIAGNOSIS — L84 Corns and callosities: Secondary | ICD-10-CM | POA: Diagnosis not present

## 2014-07-02 NOTE — Patient Instructions (Signed)
Monitor for any signs/symptoms of infection. Call the office immediately if any occur or go directly to the emergency room. Call with any questions/concerns.  

## 2014-07-06 NOTE — Progress Notes (Signed)
Patient ID: Regina Schultz, female   DOB: 05/28/1965, 49 y.o.   MRN: [id number]  Subjective: 49 year old female returns the office for follow evaluation of a wound on the right fifth digit. She states that she finished her course of antibiotics. She states the corn has reoccurred to the interspace on the 4th interspace on the right.  She denies any redness or swelling to the digits or any drainage. Denies any systemic complaints as fevers, chills, nausea, vomiting. No other complaints at this time in no acute changes since last appointment.  Objective:  AAO 3, NAD Neurovascular status unchanged. Hyperkerotic lesion on the medial aspect of the right fifth digit. Upon debridement, there are no open lesions identified. There is no interdigital maceration. There is no edema, erythema, ascending cellulitis, fluctuance, crepitus, malodor. there is no tenderness to palpation upon the digits. No pain with MTPJ range of motion. 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  No other open lesions or pre-ulcer lesions identified bilaterally. No pain with calf compression, swelling, warmth, erythema.  Assessment: 49 year old female with corn right 4th interspace due to underlying toe contractures.   Plan: -Treatment options were discussed including alternatives, risks, complications. -Lesion sharply debrided  -Dipsensed offloading pads to the area. Discussed possible surgical intervention however she wishes to continue with conservative treatment. . -Follow-up as needed. In the meantime, encouraged to call the office with any question, concerns, change in symptoms.

## 2014-10-22 ENCOUNTER — Telehealth: Payer: Self-pay | Admitting: *Deleted

## 2014-10-22 NOTE — Telephone Encounter (Signed)
Patient scheduled AEX 11/02/14 @11 :00am.

## 2014-10-22 NOTE — Telephone Encounter (Signed)
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 Quincy Simmonds.  Please call pt to schedule AEX.  Thank you.

## 2014-10-23 NOTE — Telephone Encounter (Signed)
Pt has AEX scheduled 11/02/14.  Recall extended until 10/2014.   Routing to provider for final review.  Closing encounter.

## 2014-10-30 ENCOUNTER — Ambulatory Visit: Payer: 59 | Admitting: Obstetrics and Gynecology

## 2014-10-30 ENCOUNTER — Telehealth: Payer: Self-pay | Admitting: Obstetrics and Gynecology

## 2014-10-30 NOTE — Telephone Encounter (Signed)
Patient calling to speak with nurse. She said, "I'd like to get the names of any labs Dr. Quincy Simmonds will want done this year. I am getting blood work done at work and don't want to get stuck again."

## 2014-10-30 NOTE — Telephone Encounter (Signed)
Spoke with patient. Advised yearly we recommend that patients have their hemoglobin level checked. Advised additional labs are added as needed for the patient. Advised in the past we have checked her lipid panel, CBC, and CMP. Advised other labs frequently checked are glucose, TSH, and Vitamin D. Advised can not make recommendations about what to have drawn without seeing her in office as meeting with Dr.Silva helps to decide what labs are needed. Patient is agreeable and will have lab work performed with work and faxed to our office for review and to have on file for her aex. Aware if she has any additional concerns at her aex she may need additional lab work. Patient is agreeable.  Routing to provider for final review. Patient agreeable to disposition. Will close encounter.   Patient aware provider will review message and nurse will return call if any additional advice or change of disposition.

## 2014-11-02 ENCOUNTER — Ambulatory Visit: Payer: 59 | Admitting: Obstetrics and Gynecology

## 2014-11-25 ENCOUNTER — Encounter: Payer: Self-pay | Admitting: Obstetrics and Gynecology

## 2014-11-25 ENCOUNTER — Ambulatory Visit (INDEPENDENT_AMBULATORY_CARE_PROVIDER_SITE_OTHER): Payer: 59 | Admitting: Obstetrics and Gynecology

## 2014-11-25 VITALS — BP 122/84 | HR 60 | Resp 14 | Ht 63.5 in | Wt 134.0 lb

## 2014-11-25 DIAGNOSIS — F329 Major depressive disorder, single episode, unspecified: Secondary | ICD-10-CM

## 2014-11-25 DIAGNOSIS — F411 Generalized anxiety disorder: Secondary | ICD-10-CM

## 2014-11-25 DIAGNOSIS — Z0000 Encounter for general adult medical examination without abnormal findings: Secondary | ICD-10-CM | POA: Diagnosis not present

## 2014-11-25 DIAGNOSIS — Z01419 Encounter for gynecological examination (general) (routine) without abnormal findings: Secondary | ICD-10-CM | POA: Diagnosis not present

## 2014-11-25 DIAGNOSIS — F32A Depression, unspecified: Secondary | ICD-10-CM

## 2014-11-25 LAB — POCT URINALYSIS DIPSTICK
Bilirubin, UA: NEGATIVE
Blood, UA: NEGATIVE
GLUCOSE UA: NEGATIVE
Ketones, UA: NEGATIVE
NITRITE UA: POSITIVE
PH UA: 5
Protein, UA: NEGATIVE
Urobilinogen, UA: NEGATIVE

## 2014-11-25 MED ORDER — CITALOPRAM HYDROBROMIDE 10 MG PO TABS: 10.0000 mg | ORAL_TABLET | Freq: Every day | ORAL | Status: DC
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
## 2014-11-25 NOTE — Patient Instructions (Signed)

## 2014-11-30 ENCOUNTER — Encounter: Payer: Self-pay | Admitting: Obstetrics and Gynecology

## 2014-11-30 LAB — IPS PAP TEST WITH HPV

## 2014-12-04 ENCOUNTER — Other Ambulatory Visit: Payer: Self-pay | Admitting: Oncology

## 2014-12-08 NOTE — Telephone Encounter (Signed)
Chart reviewed.

## 2015-01-21 ENCOUNTER — Telehealth: Payer: Self-pay | Admitting: Oncology

## 2015-01-21 ENCOUNTER — Ambulatory Visit (HOSPITAL_BASED_OUTPATIENT_CLINIC_OR_DEPARTMENT_OTHER): Payer: 59 | Admitting: Oncology

## 2015-01-21 ENCOUNTER — Ambulatory Visit: Payer: 59 | Admitting: Oncology

## 2015-01-21 ENCOUNTER — Other Ambulatory Visit: Payer: 59

## 2015-01-21 ENCOUNTER — Other Ambulatory Visit (HOSPITAL_BASED_OUTPATIENT_CLINIC_OR_DEPARTMENT_OTHER): Payer: 59

## 2015-01-21 ENCOUNTER — Encounter: Payer: 59 | Admitting: *Deleted

## 2015-01-21 VITALS — BP 112/75 | HR 68 | Temp 98.5°F | Resp 18 | Ht 63.5 in | Wt 134.3 lb

## 2015-01-21 DIAGNOSIS — Z79810 Long term (current) use of selective estrogen receptor modulators (SERMs): Secondary | ICD-10-CM | POA: Diagnosis not present

## 2015-01-21 DIAGNOSIS — Z006 Encounter for examination for normal comparison and control in clinical research program: Secondary | ICD-10-CM

## 2015-01-21 DIAGNOSIS — C50412 Malignant neoplasm of upper-outer quadrant of left female breast: Secondary | ICD-10-CM

## 2015-01-21 DIAGNOSIS — Z853 Personal history of malignant neoplasm of breast: Secondary | ICD-10-CM

## 2015-01-21 LAB — COMPREHENSIVE METABOLIC PANEL (CC13)
ALBUMIN: 4.2 g/dL (ref 3.5–5.0)
ALK PHOS: 76 U/L (ref 40–150)
ALT: 22 U/L (ref 0–55)
ANION GAP: 9 meq/L (ref 3–11)
AST: 24 U/L (ref 5–34)
BILIRUBIN TOTAL: 0.62 mg/dL (ref 0.20–1.20)
BUN: 11 mg/dL (ref 7.0–26.0)
CALCIUM: 9.4 mg/dL (ref 8.4–10.4)
CO2: 27 mEq/L (ref 22–29)
Chloride: 103 mEq/L (ref 98–109)
Creatinine: 0.8 mg/dL (ref 0.6–1.1)
EGFR: 87 mL/min/{1.73_m2} — AB (ref >90)
GLUCOSE: 85 mg/dL (ref 70–140)
POTASSIUM: 4.5 meq/L (ref 3.5–5.1)
Sodium: 140 mEq/L (ref 136–145)
TOTAL PROTEIN: 7.3 g/dL (ref 6.4–8.3)

## 2015-01-21 LAB — CBC WITH DIFFERENTIAL/PLATELET
BASO%: 1 % (ref 0.0–2.0)
Basophils Absolute: 0.1 10*3/uL (ref 0.0–0.1)
EOS%: 1.3 % (ref 0.0–7.0)
Eosinophils Absolute: 0.1 10*3/uL (ref 0.0–0.5)
HEMATOCRIT: 42.3 % (ref 34.8–46.6)
HGB: 14.4 g/dL (ref 11.6–15.9)
LYMPH#: 1.9 10*3/uL (ref 0.9–3.3)
LYMPH%: 27.2 % (ref 14.0–49.7)
MCH: 31.3 pg (ref 25.1–34.0)
MCHC: 34 g/dL (ref 31.5–36.0)
MCV: 92.1 fL (ref 79.5–101.0)
MONO#: 0.6 10*3/uL (ref 0.1–0.9)
MONO%: 8.3 % (ref 0.0–14.0)
NEUT%: 62.2 % (ref 38.4–76.8)
NEUTROS ABS: 4.4 10*3/uL (ref 1.5–6.5)
Platelets: 256 10*3/uL (ref 145–400)
RBC: 4.59 10*6/uL (ref 3.70–5.45)
RDW: 12.3 % (ref 11.2–14.5)
WBC: 7.1 10*3/uL (ref 3.9–10.3)

## 2015-01-21 NOTE — Telephone Encounter (Signed)
appointments made and avs printed for patient °
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
## 2015-01-21 NOTE — Progress Notes (Signed)
01/21/2015 Patient in to clinic today for annual follow-up appointment. She reports that she is fully active (KPS = 100%) with no serious health problems over the past year. Following history and physical exam by Dr. Jana Hakim, patient and physician made a decision to continue endocrine therapy with tamoxifen for another year, as patient would like to complete her additional five years of tamoxifen. Patient will return for a follow-up visit in one year, at which time she may transition to her gynecologist Rolly Pancake MD) and/or PCP for ongoing annual follow-ups. 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 Patient reports some changes in her vision, which her eye doctor, Dr. Ellin Mayhew, feels is related to aging. Patient also reports having some breast reconstruction surgery by Dr. Jenita Seashore, following her last annual appointment at [facility]. Cindy S. Brigitte Pulse BSN, RN, CCRP 01/21/2015 11:11 AM

## 2015-05-19 ENCOUNTER — Telehealth: Payer: Self-pay | Admitting: Obstetrics and Gynecology

## 2015-05-19 NOTE — Telephone Encounter (Signed)
Spoke with patient. Patient is requesting to start on Wellbutrin. Reports she stopped taking her Citalopram "a while ago." Denies any current depression or SI/HI. Reports she would like to start Wellbutrin to help with smoking cessation. Has a history of anxiety as well. Reports she was told she may stop her Tamoxifen at this time or take for 1 more year. The patient reports she would like to stop taking Tamoxifen and start taking Wellbutrin. "I feel like the benefits of taking Wellbutrin are better than taking the Tamoxifen at this point." Advised I will speak with Dr.Silva and return call with additional recommendations. She is agreeable.

## 2015-05-19 NOTE — Telephone Encounter (Signed)
I would like to send a staff message to Dr. Jana Hakim to see if he has any specific recommendations for the patient as the Wellbutrin may decrease the effectiveness of the Tamoxifen.  I see that she is in a research protocol for her cancer care.  I will report back.

## 2015-05-19 NOTE — Telephone Encounter (Signed)
Patient calling requesting an prescription for Wellbutrin.  [facility] on [address] in [place]

## 2015-05-20 NOTE — Telephone Encounter (Signed)
Spoke with patient. Advised of message as seen below from [place]. She is agreeable and verbalizes understanding.

## 2015-05-27 ENCOUNTER — Other Ambulatory Visit: Payer: Self-pay | Admitting: Oncology

## 2015-05-27 NOTE — Telephone Encounter (Signed)
Brook Oletta Lamas, MD  P [facility] Triage Pool           Please contact patient with the following information from Dr. Jana Hakim.   I will prescribe the Wellbutrin XL 150 mg daily to help with smoking cessation. This can be increased to 300 mg daily, but we will start with the lower dosage.  Dispense 30, RF 5.   Thank you,   Brook       Previous Messages     ----- Message -----   From: Chauncey Cruel, MD   Sent: 05/27/2015  8:52 AM    To: Brook Oletta Lamas, MD, *  Subject: RE: Tamoxifen use and Welbutrin          Brook, this pt's tamoxifen is not part of her original study protocol-- we can discontinue it if we wish.   There is an interaction between Wellbutrin and tamoxifen which does affect TAM metabolism and MAY decrease the effectiveness of Tamoxifen. This has not been definitively established clinically.   I would vote for continuing TAM (instead of stopping it) and going ahead with Wellbutrin-- her stopping smoking is considerably more important than the Tam question at this point.   Thanks!        Left message to call Oakley at [phone].

## 2015-06-01 NOTE — Telephone Encounter (Signed)
Left message to call Kaitlyn at [phone]. 

## 2015-06-03 MED ORDER — BUPROPION HCL ER (XL) 150 MG PO TB24: 150.0000 mg | ORAL_TABLET | Freq: Every day | ORAL | Status: DC

## 2015-06-03 NOTE — Telephone Encounter (Signed)
Spoke with patient. Advised of message as seen below from [place] and [place]. She is agreeable and verbalizes understanding. Rx for Wellbutrin XL 150 mg take 1 tablet po daily #30 5RF sent to pharmacy on file. Advised she will need to take Wellbutrin XL for 1 week prior to stopping smoking. She is agreeable and verbalizes understanding. If she feels this dose is not helping with smoking cessation after 1 month she will contact the office for discussion of dosage increase.  Routing to provider for final review. Patient agreeable to disposition. Will close encounter.

## 2015-06-03 NOTE — Telephone Encounter (Signed)
Left message to call Kaitlyn at [phone]. 

## 2015-07-13 ENCOUNTER — Telehealth: Payer: Self-pay | Admitting: Obstetrics and Gynecology

## 2015-07-13 ENCOUNTER — Other Ambulatory Visit: Payer: Self-pay | Admitting: Obstetrics and Gynecology

## 2015-07-13 MED ORDER — BUPROPION HCL ER (XL) 300 MG PO TB24: 300.0000 mg | ORAL_TABLET | Freq: Every day | ORAL | Status: DC

## 2015-07-13 NOTE — Telephone Encounter (Signed)
Patient has been notified and is agreeable. Will close encounter.

## 2015-07-13 NOTE — Telephone Encounter (Signed)
I sent an Rx through to [facility] Rx for Wellbutrin XL 300 mg daily.  #90, RF one.  You may inform patient and close the encounter.

## 2015-07-13 NOTE — Telephone Encounter (Signed)
Patient would like to speak with nurse about increasing her Wellbutrin.

## 2015-07-13 NOTE — Telephone Encounter (Signed)
Spoke with patient. Patient states that she has been taking Wellbutrin 150 mg daily to help with smoking cessation. Reports she is not noticing any difference with her smoking with taking the Wellbutrin 150 mg. "It comes and goes in waves. I will cut back and then I go right back to where I was before." Patient is requesting to increase her dosage of Wellbutrin to 300 mg daily to see if this will help. Per telephone note on 05/19/2015 Dr.Silva states that dosage may need to be increased to 300 mg daily. Advised I will speak with Dr.Silva and return call with further recommendations. She is agreeable.   Dr.Silva, okay to send in new rx for patient to start on Wellbutrin 300 mg daily? Patient would like this sent to [facility].

## 2015-11-01 ENCOUNTER — Telehealth: Payer: Self-pay | Admitting: Obstetrics and Gynecology

## 2015-11-01 NOTE — Telephone Encounter (Signed)
Patient has a question for Dr.Silva's nurse. Patient did not give any further details. Patient said this is not urgent, just a quick question.

## 2015-11-02 ENCOUNTER — Encounter: Payer: Self-pay | Admitting: Obstetrics and Gynecology

## 2015-11-02 ENCOUNTER — Ambulatory Visit (INDEPENDENT_AMBULATORY_CARE_PROVIDER_SITE_OTHER): Payer: 59 | Admitting: Obstetrics and Gynecology

## 2015-11-02 VITALS — BP 118/60 | HR 68 | Resp 12 | Wt 132.0 lb

## 2015-11-02 DIAGNOSIS — Z9221 Personal history of antineoplastic chemotherapy: Secondary | ICD-10-CM

## 2015-11-02 DIAGNOSIS — Z9229 Personal history of other drug therapy: Secondary | ICD-10-CM

## 2015-11-02 DIAGNOSIS — N950 Postmenopausal bleeding: Secondary | ICD-10-CM

## 2015-11-02 DIAGNOSIS — N9489 Other specified conditions associated with female genital organs and menstrual cycle: Secondary | ICD-10-CM | POA: Diagnosis not present

## 2015-11-02 DIAGNOSIS — Z09 Encounter for follow-up examination after completed treatment for conditions other than malignant neoplasm: Secondary | ICD-10-CM | POA: Diagnosis not present

## 2015-11-02 DIAGNOSIS — Z853 Personal history of malignant neoplasm of breast: Secondary | ICD-10-CM | POA: Diagnosis not present

## 2015-11-02 DIAGNOSIS — N898 Other specified noninflammatory disorders of vagina: Secondary | ICD-10-CM

## 2015-11-02 NOTE — Telephone Encounter (Signed)
She should not be bleeding from stopping the tamoxifen and needs an appointment for evalutation
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
## 2015-11-02 NOTE — Telephone Encounter (Signed)
Spoke with patient. Patient states that she has been on tamoxifen for 11 years. Reports that around 2 months ago she stopped taking tamoxifen. States that her oncologist said it would be okay to stop. Since stopping tamoxifen reports that she has been experiencing intermittent spotting. Denies any heavy bleeding or discomfort. Patient is asking if this could be coming from stopping her tamoxifen? Also asking if she should restart her tamoxifen due to irregular spotting. "I went through early menopause with my chemo and have not had bleeding in years. I do not want to start having bleeding from not taking the medication now." Advised I will speak with covering provider and return call with further recommendations. She is agreeable and verbalizes understanding.  Routing to [place] for review and advise as Dr.Silva is out of the office this week.

## 2015-11-02 NOTE — Patient Instructions (Signed)

## 2015-11-02 NOTE — Telephone Encounter (Signed)
Spoke with patient. Advised of message as seen below form Dr.Jertson. She is agreeable and verbalizes understanding. Appointment scheduled for today at 3 pm with Dr.Jertson. She is agreeable to date, time, and to see a different provider.  Cc: Dr.Silva  Routing to provider for final review. Patient agreeable to disposition. Will close encounter.

## 2015-11-03 ENCOUNTER — Telehealth: Payer: Self-pay | Admitting: *Deleted

## 2015-11-03 LAB — WET PREP BY MOLECULAR PROBE
Candida species: NEGATIVE
Gardnerella vaginalis: POSITIVE — AB
Trichomonas vaginosis: NEGATIVE

## 2015-11-03 MED ORDER — METRONIDAZOLE 500 MG PO TABS: 500.0000 mg | ORAL_TABLET | Freq: Two times a day (BID) | ORAL | 0 refills | Status: DC

## 2015-11-03 NOTE — Telephone Encounter (Signed)
Spoke with patient and informed her of results. Advised to avoid any alcohol while taking the Flagyl. RX sent into pharmacy of request. Patient voiced understanding -eh

## 2015-11-03 NOTE — Telephone Encounter (Signed)
-----   Message from Salvadore Dom, MD sent at 11/03/2015  9:13 AM EDT ----- Please inform the patient that her vaginitis probe was + for BV and treat with flagyl (either oral or vaginal, her choice), no ETOH while on Flagyl.  Oral: Flagyl 500 mg BID x 7 days, or Vaginal: Metrogel, 1 applicator per vagina q day x 5 days. Endometrial biopsy is pending

## 2015-11-04 ENCOUNTER — Ambulatory Visit (INDEPENDENT_AMBULATORY_CARE_PROVIDER_SITE_OTHER): Payer: 59

## 2015-11-04 ENCOUNTER — Ambulatory Visit (INDEPENDENT_AMBULATORY_CARE_PROVIDER_SITE_OTHER): Payer: 59 | Admitting: Podiatry

## 2015-11-04 ENCOUNTER — Encounter: Payer: Self-pay | Admitting: Podiatry

## 2015-11-04 DIAGNOSIS — L84 Corns and callosities: Secondary | ICD-10-CM

## 2015-11-04 DIAGNOSIS — M205X1 Other deformities of toe(s) (acquired), right foot: Secondary | ICD-10-CM

## 2015-11-04 DIAGNOSIS — M2040 Other hammer toe(s) (acquired), unspecified foot: Secondary | ICD-10-CM | POA: Diagnosis not present

## 2015-11-04 DIAGNOSIS — R52 Pain, unspecified: Secondary | ICD-10-CM

## 2015-11-04 NOTE — Progress Notes (Signed)
Subjective: 50 year old female persists the office today for concerns of a reoccurring callus to the inside aspect of her right fifth toe as well as pain which she points the dorsal lateral aspect of the right fifth toe. The areas and painful with shoe gear. His been ongoing for several years has been worsening. She denies he swelling or redness. No recent injury or trauma.Denies any systemic complaints such as fevers, chills, nausea, vomiting. No acute changes since last appointment, and no other complaints at this time.   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 Tenderness on the dorsal lateral aspect of the fifth digit along the prominence of the PIPJ. Small hyperkeratotic lesion present along the medial portion of the fifth toe. No underlying ulceration, drainage or other signs of infection. No pain with calf compression, swelling, warmth, erythema.  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 Hammertoe contractures are present. No evidence of acute fracture. -Hyperkeratotic lesion debrided 1 without complications or bleeding. Continue offloading. I discussed surgical intervention she'll consider doing this. She would have insurance quote and this was submitted to insurance. -Follow-up as needed or sooner if any problems arise. In the meantime, encouraged to call the office with any questions, concerns, change in symptoms.   Celesta Gentile, DPM

## 2015-11-11 ENCOUNTER — Ambulatory Visit (INDEPENDENT_AMBULATORY_CARE_PROVIDER_SITE_OTHER): Payer: 59 | Admitting: Obstetrics and Gynecology

## 2015-11-11 ENCOUNTER — Other Ambulatory Visit: Payer: Self-pay | Admitting: Obstetrics and Gynecology

## 2015-11-11 ENCOUNTER — Ambulatory Visit (INDEPENDENT_AMBULATORY_CARE_PROVIDER_SITE_OTHER): Payer: 59

## 2015-11-11 ENCOUNTER — Encounter: Payer: Self-pay | Admitting: Obstetrics and Gynecology

## 2015-11-11 VITALS — BP 108/66 | HR 76 | Ht 63.5 in | Wt 131.0 lb

## 2015-11-11 DIAGNOSIS — N950 Postmenopausal bleeding: Secondary | ICD-10-CM

## 2015-11-11 DIAGNOSIS — Z9221 Personal history of antineoplastic chemotherapy: Secondary | ICD-10-CM

## 2015-11-11 DIAGNOSIS — Z853 Personal history of malignant neoplasm of breast: Secondary | ICD-10-CM

## 2015-11-11 DIAGNOSIS — Z09 Encounter for follow-up examination after completed treatment for conditions other than malignant neoplasm: Secondary | ICD-10-CM | POA: Diagnosis not present

## 2015-11-11 DIAGNOSIS — N840 Polyp of corpus uteri: Secondary | ICD-10-CM

## 2015-11-11 DIAGNOSIS — Z9229 Personal history of other drug therapy: Secondary | ICD-10-CM
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
## 2015-11-12 LAB — FOLLICLE STIMULATING HORMONE: FSH: 49.2 m[IU]/mL

## 2015-11-12 LAB — ESTRADIOL: ESTRADIOL: 20 pg/mL

## 2015-11-16 ENCOUNTER — Telehealth: Payer: Self-pay | Admitting: *Deleted

## 2015-11-16 NOTE — Telephone Encounter (Signed)
Call to patient regarding surgery date options. Discussed available dates. Patient will call back after speaking with family.

## 2015-11-17 NOTE — Telephone Encounter (Signed)
Return call to Sally. °

## 2015-11-17 NOTE — Telephone Encounter (Signed)
Call to patient. Left message returning call. Checking to see if has decided on surgery date. Have previously discussed 11-23-15.

## 2015-11-17 NOTE — Telephone Encounter (Signed)
Surgery on 11/23/15 is Ok from my standpoint. Ok to schedule on this date. Patient will need preop.  [place]

## 2015-11-17 NOTE — Telephone Encounter (Signed)
Patient returning call.

## 2015-11-18 NOTE — Telephone Encounter (Signed)
Spoke with patient. Advised per Lamont Snowball, RN Hysteroscopy Myosure D&C has been scheduled for 11/23/2015 at 11 am at [place] she can expect to arrive at the hospital around 7:30 AM, but that the surgical center with the hospital will give her a definite time of arrival. Pre-op appointment scheduled with Dr.Silva for tomorrow 11/19/2015 at 9 am. Post-op appointment scheduled for 12/15/2015 at 8:15 am with Dr.Silva. Patient is agreeable to both appointment dates and times. Aware she will be contacted by the hospital surgical center to schedule a pre-op appointment with their facility. She is agreeable. Advised there are several OTC medications that may affect bleeding time and healing so we ask that she stop taking Aspirin, Vitamin E, and Herbal supplements at this time, 3 days prior to surgery will need to stop taking Aleve, 24 hours prior to surgery will need to stop taking Motrin, Advil, and Ibuprofen. May take Tylenol or Acetaminophen for any discomfort prior to surgery. No bowel prep is needed. Do not eat or drink anything after midnight on the night before surgery. Dress casually the morning of surgery, do not take any valuables with you and do not wear makeup, jewelry, or lotion. You should not shave 48 hours prior to surgery. Patient is agreeable and verbalizes understanding. Surgery information sheet given to Estill Bamberg to provide to patient at her appointment tomorrow morning.  Routing to provider for final review. Patient agreeable to disposition. Will close encounter.

## 2015-11-18 NOTE — Telephone Encounter (Signed)
Patient called to see if surgery was scheduled for Tuesday, 11/23/15. She is trying to make plans accordingly.  Cc: Regina Schultz

## 2015-11-19 ENCOUNTER — Ambulatory Visit (INDEPENDENT_AMBULATORY_CARE_PROVIDER_SITE_OTHER): Payer: 59 | Admitting: Obstetrics and Gynecology

## 2015-11-19 ENCOUNTER — Encounter: Payer: Self-pay | Admitting: Obstetrics and Gynecology

## 2015-11-19 ENCOUNTER — Encounter (HOSPITAL_COMMUNITY): Payer: Self-pay | Admitting: *Deleted

## 2015-11-19 VITALS — BP 122/78 | HR 64 | Ht 63.5 in | Wt 129.0 lb

## 2015-11-19 DIAGNOSIS — N950 Postmenopausal bleeding: Secondary | ICD-10-CM | POA: Diagnosis not present

## 2015-11-19 DIAGNOSIS — N9489 Other specified conditions associated with female genital organs and menstrual cycle: Secondary | ICD-10-CM | POA: Diagnosis not present

## 2015-11-19 DIAGNOSIS — N898 Other specified noninflammatory disorders of vagina: Secondary | ICD-10-CM

## 2015-11-19 MED ORDER — METRONIDAZOLE 500 MG PO TABS: 500.0000 mg | ORAL_TABLET | Freq: Two times a day (BID) | ORAL | 0 refills | Status: DC
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
## 2015-11-23 ENCOUNTER — Ambulatory Visit (HOSPITAL_COMMUNITY): Payer: 59 | Admitting: Anesthesiology

## 2015-11-23 ENCOUNTER — Encounter (HOSPITAL_COMMUNITY): Payer: Self-pay | Admitting: *Deleted

## 2015-11-23 ENCOUNTER — Encounter (HOSPITAL_COMMUNITY)
Admission: RE | Disposition: A | Discharge status: Home / Self Care | Payer: Self-pay | Source: Ambulatory Visit | Attending: Obstetrics and Gynecology

## 2015-11-23 ENCOUNTER — Ambulatory Visit (HOSPITAL_COMMUNITY)
Admission: RE | Admit: 2015-11-23 | Discharge: 2015-11-23 | Disposition: A | Discharge status: Home / Self Care | Payer: 59 | Source: Ambulatory Visit | Attending: Obstetrics and Gynecology | Admitting: Obstetrics and Gynecology

## 2015-11-23 DIAGNOSIS — Z853 Personal history of malignant neoplasm of breast: Secondary | ICD-10-CM

## 2015-11-23 DIAGNOSIS — F17210 Nicotine dependence, cigarettes, uncomplicated: Secondary | ICD-10-CM | POA: Insufficient documentation

## 2015-11-23 DIAGNOSIS — N950 Postmenopausal bleeding: Secondary | ICD-10-CM | POA: Insufficient documentation

## 2015-11-23 DIAGNOSIS — N840 Polyp of corpus uteri: Secondary | ICD-10-CM | POA: Insufficient documentation

## 2015-11-23 HISTORY — PX: DILATATION & CURETTAGE/HYSTEROSCOPY WITH MYOSURE: SHX6511

## 2015-11-23 LAB — BASIC METABOLIC PANEL
ANION GAP: 5 (ref 5–15)
BUN: 10 mg/dL (ref 6–20)
CALCIUM: 9.1 mg/dL (ref 8.9–10.3)
CO2: 29 mmol/L (ref 22–32)
Chloride: 103 mmol/L (ref 101–111)
Creatinine, Ser: 0.79 mg/dL (ref 0.44–1.00)
GFR calc Af Amer: >60 mL/min (ref >60)
GLUCOSE: 92 mg/dL (ref 65–99)
Potassium: 3.9 mmol/L (ref 3.5–5.1)
Sodium: 137 mmol/L (ref 135–145)

## 2015-11-23 LAB — CBC
HCT: 40.3 % (ref 36.0–46.0)
Hemoglobin: 13.9 g/dL (ref 12.0–15.0)
MCH: 31.3 pg (ref 26.0–34.0)
MCHC: 34.5 g/dL (ref 30.0–36.0)
MCV: 90.8 fL (ref 78.0–100.0)
PLATELETS: 295 10*3/uL (ref 150–400)
RBC: 4.44 MIL/uL (ref 3.87–5.11)
RDW: 12.2 % (ref 11.5–15.5)
WBC: 6.3 10*3/uL (ref 4.0–10.5)

## 2015-11-23 SURGERY — DILATATION & CURETTAGE/HYSTEROSCOPY WITH MYOSURE
Anesthesia: General | Site: Vagina

## 2015-11-23 MED ORDER — PROPOFOL 10 MG/ML IV BOLUS
INTRAVENOUS | Status: AC
  Filled 2015-11-23: qty 20

## 2015-11-23 MED ORDER — ONDANSETRON HCL 4 MG/2ML IJ SOLN
INTRAMUSCULAR | Status: AC
  Filled 2015-11-23: qty 2

## 2015-11-23 MED ORDER — KETOROLAC TROMETHAMINE 30 MG/ML IJ SOLN
INTRAMUSCULAR | Status: DC | PRN
  Administered 2015-11-23: 30 mg via INTRAVENOUS

## 2015-11-23 MED ORDER — MIDAZOLAM HCL 2 MG/2ML IJ SOLN
INTRAMUSCULAR | Status: AC
  Filled 2015-11-23: qty 2

## 2015-11-23 MED ORDER — LACTATED RINGERS IV SOLN
INTRAVENOUS | Status: DC
  Administered 2015-11-23 (×2): via INTRAVENOUS

## 2015-11-23 MED ORDER — FLUMAZENIL 0.5 MG/5ML IV SOLN
INTRAVENOUS | Status: AC
  Filled 2015-11-23: qty 5

## 2015-11-23 MED ORDER — FENTANYL CITRATE (PF) 250 MCG/5ML IJ SOLN
INTRAMUSCULAR | Status: DC | PRN
  Administered 2015-11-23: 100 ug via INTRAVENOUS

## 2015-11-23 MED ORDER — FENTANYL CITRATE (PF) 100 MCG/2ML IJ SOLN
INTRAMUSCULAR | Status: AC
  Filled 2015-11-23: qty 4

## 2015-11-23 MED ORDER — SCOPOLAMINE 1 MG/3DAYS TD PT72
1.0000 | MEDICATED_PATCH | Freq: Once | TRANSDERMAL | Status: DC
  Administered 2015-11-23: 1.5 mg via TRANSDERMAL

## 2015-11-23 MED ORDER — DEXAMETHASONE SODIUM PHOSPHATE 4 MG/ML IJ SOLN
INTRAMUSCULAR | Status: DC | PRN
Start: 2015-11-23 — End: 2015-11-23
  Administered 2015-11-23: 10 mg via INTRAVENOUS

## 2015-11-23 MED ORDER — LIDOCAINE HCL (CARDIAC) 20 MG/ML IV SOLN
INTRAVENOUS | Status: AC
  Filled 2015-11-23: qty 5

## 2015-11-23 MED ORDER — SCOPOLAMINE 1 MG/3DAYS TD PT72
MEDICATED_PATCH | TRANSDERMAL | Status: AC
  Filled 2015-11-23: qty 1

## 2015-11-23 MED ORDER — SODIUM CHLORIDE 0.9 % IR SOLN
Status: DC | PRN
  Administered 2015-11-23: 3000 mL

## 2015-11-23 MED ORDER — DEXAMETHASONE SODIUM PHOSPHATE 10 MG/ML IJ SOLN
INTRAMUSCULAR | Status: AC
  Filled 2015-11-23: qty 1

## 2015-11-23 MED ORDER — MIDAZOLAM HCL 2 MG/2ML IJ SOLN
INTRAMUSCULAR | Status: DC | PRN
  Administered 2015-11-23: 2 mg via INTRAVENOUS

## 2015-11-23 MED ORDER — ONDANSETRON HCL 4 MG/2ML IJ SOLN
INTRAMUSCULAR | Status: DC | PRN
  Administered 2015-11-23: 4 mg via INTRAVENOUS

## 2015-11-23 MED ORDER — LIDOCAINE HCL 1 % IJ SOLN
INTRAMUSCULAR | Status: AC
  Filled 2015-11-23: qty 20

## 2015-11-23 MED ORDER — IBUPROFEN 800 MG PO TABS: 800.0000 mg | ORAL_TABLET | Freq: Three times a day (TID) | ORAL | 0 refills | Status: DC | PRN

## 2015-11-23 MED ORDER — FLUMAZENIL 0.5 MG/5ML IV SOLN
INTRAVENOUS | Status: DC | PRN
  Administered 2015-11-23: 0.2 mg via INTRAVENOUS

## 2015-11-23 MED ORDER — LIDOCAINE HCL 1 % IJ SOLN
INTRAMUSCULAR | Status: DC | PRN
  Administered 2015-11-23: 10 mL

## 2015-11-23 MED ORDER — LIDOCAINE HCL (CARDIAC) 20 MG/ML IV SOLN
INTRAVENOUS | Status: DC | PRN
  Administered 2015-11-23: 100 mg via INTRAVENOUS

## 2015-11-23 MED ORDER — PROPOFOL 10 MG/ML IV BOLUS
INTRAVENOUS | Status: DC | PRN
  Administered 2015-11-23: 200 mg via INTRAVENOUS

## 2015-11-23 SURGICAL SUPPLY — 21 items
CANISTER SUCT 3000ML (MISCELLANEOUS) ×2 IMPLANT
CATH ROBINSON RED A/P 16FR (CATHETERS) ×2 IMPLANT
CLOTH BEACON ORANGE TIMEOUT ST (SAFETY) ×2 IMPLANT
CONTAINER PREFILL 10% NBF 60ML (FORM) ×4 IMPLANT
DEVICE MYOSURE LITE (MISCELLANEOUS) ×1 IMPLANT
DEVICE MYOSURE REACH (MISCELLANEOUS) IMPLANT
DILATOR CANAL MILEX (MISCELLANEOUS) ×1 IMPLANT
ELECT REM PT RETURN 9FT ADLT (ELECTROSURGICAL)
ELECTRODE REM PT RTRN 9FT ADLT (ELECTROSURGICAL) IMPLANT
FILTER ARTHROSCOPY CONVERTOR (FILTER) ×2 IMPLANT
GLOVE BIO SURGEON STRL SZ 6.5 (GLOVE) ×2 IMPLANT
GLOVE BIOGEL PI IND STRL 7.0 (GLOVE) ×2 IMPLANT
GLOVE BIOGEL PI INDICATOR 7.0 (GLOVE) ×2
GOWN STRL REUS W/TWL LRG LVL3 (GOWN DISPOSABLE) ×4 IMPLANT
PACK VAGINAL MINOR WOMEN LF (CUSTOM PROCEDURE TRAY) ×2 IMPLANT
PAD OB MATERNITY 4.3X12.25 (PERSONAL CARE ITEMS) ×2 IMPLANT
SEAL ROD LENS SCOPE MYOSURE (ABLATOR) ×2 IMPLANT
TOWEL OR 17X24 6PK STRL BLUE (TOWEL DISPOSABLE) ×4 IMPLANT
TUBING AQUILEX INFLOW (TUBING) ×2 IMPLANT
TUBING AQUILEX OUTFLOW (TUBING) ×2 IMPLANT
WATER STERILE IRR 1000ML POUR (IV SOLUTION) ×2 IMPLANT

## 2015-11-23 NOTE — Discharge Instructions (Signed)
DISCHARGE INSTRUCTIONS: D&C / D&E The following instructions have been prepared to help you care for yourself upon your return home.   Personal hygiene:  Use sanitary pads for vaginal drainage, not tampons.  Shower the day after your procedure.  NO tub baths, pools or Jacuzzis for 2-3 weeks.  Wipe front to back after using the bathroom.  Activity and limitations:  Do NOT drive or operate any equipment for 24 hours. The effects of anesthesia are still present and drowsiness may result.  Do NOT rest in bed all day.  Walking is encouraged.  Walk up and down stairs slowly.  You may resume your normal activity in one to two days or as indicated by your physician.  Sexual activity: NO intercourse for at least 2 weeks after the procedure, or as indicated by your physician.  Diet: Eat a light meal as desired this evening. You may resume your usual diet tomorrow.  Return to work: You may resume your work activities in one to two days or as indicated by your doctor.  What to expect after your surgery: Expect to have vaginal bleeding/discharge for 2-3 days and spotting for up to 10 days. It is not unusual to have soreness for up to 1-2 weeks. You may have a slight burning sensation when you urinate for the first day. Mild cramps may continue for a couple of days. You may have a regular period in 2-6 weeks.  Call your doctor for any of the following:  Excessive vaginal bleeding, saturating and changing one pad every hour.  Inability to urinate 6 hours after discharge from hospital.  Pain not relieved by pain medication.  Fever of 100.4 F or greater.  Unusual vaginal discharge or odor.   Call for an appointment:    Patients signature: ______________________  Nurses signature ________________________  Support person's signature_______________________   Hysteroscopy, Care After Refer to this sheet in the next few weeks. These instructions provide you with information on  caring for yourself after your procedure. Your health care provider may also give you more specific instructions. Your treatment has been planned according to current medical practices, but problems sometimes occur. Call your health care provider if you have any problems or questions after your procedure.  WHAT TO EXPECT AFTER THE PROCEDURE After your procedure, it is typical to have the following:  You may have some cramping. This normally lasts for a couple days.  You may have bleeding. This can vary from light spotting for a few days to menstrual-like bleeding for 3-7 days. HOME CARE INSTRUCTIONS  Rest for the first 1-2 days after the procedure.  Only take over-the-counter or prescription medicines as directed by your health care provider. Do not take aspirin. It can increase the chances of bleeding.  Take showers instead of baths for 2 weeks or as directed by your health care provider.  Do not drive for 24 hours or as directed.  Do not drink alcohol while taking pain medicine.  Do not use tampons, douche, or have sexual intercourse for 2 weeks or until your health care provider says it is okay.  Take your temperature twice a day for 4-5 days. Write it down each time.  Follow your health care provider's advice about diet, exercise, and lifting.  If you develop constipation, you may:  Take a mild laxative if your health care provider approves.  Add bran foods to your diet.  Drink enough fluids to keep your urine clear or pale yellow.  Try to have  someone with you or available to you for the first 24-48 hours, especially if you were given a general anesthetic.  Follow up with your health care provider as directed. SEEK MEDICAL CARE IF:  You feel dizzy or lightheaded.  You feel sick to your stomach (nauseous).  You have abnormal vaginal discharge.  You have a rash.  You have pain that is not controlled with medicine. SEEK IMMEDIATE MEDICAL CARE IF:  You have bleeding  that is heavier than a normal menstrual period.  You have a fever.  You have increasing cramps or pain, not controlled with medicine.  You have new belly (abdominal) pain.  You pass out.  You have pain in the tops of your shoulders (shoulder strap areas).  You have shortness of breath.   This information is not intended to replace advice given to you by your health care provider. Make sure you discuss any questions you have with your health care provider.   Document Released: 12/11/2012 Document Reviewed: 12/11/2012 Elsevier Interactive Patient Education 2016 Else[employer]  Dilation and Curettage or Vacuum Curettage, Care After Refer to this sheet in the next few weeks. These instructions provide you with information on caring for yourself after your procedure. Your health care provider may also give you more specific instructions. Your treatment has been planned according to current medical practices, but problems sometimes occur. Call your health care provider if you have any problems or questions after your procedure. WHAT TO EXPECT AFTER THE PROCEDURE After your procedure, it is typical to have light cramping and bleeding. This may last for 2 days to 2 weeks after the procedure. HOME CARE INSTRUCTIONS   Do not drive for 24 hours.  Wait 1 week before returning to strenuous activities.  Take your temperature 2 times a day for 4 days and write it down. Provide these temperatures to your health care provider if you develop a fever.  Avoid long periods of standing.  Avoid heavy lifting, pushing, or pulling. Do not lift anything heavier than 10 pounds (4.5 kg).  Limit stair climbing to once or twice a day.  Take rest periods often.  You may resume your usual diet.  Drink enough fluids to keep your urine clear or pale yellow.  Your usual bowel function should return. If you have constipation, you may:  Take a mild laxative with permission from your health care  provider.  Add fruit and bran to your diet.  Drink more fluids.  Take showers instead of baths until your health care provider gives you permission to take baths.  Do not go swimming or use a hot tub until your health care provider approves.  Try to have someone with you or available to you the first 24-48 hours, especially if you were given a general anesthetic.  Do not douche, use tampons, or have sex (intercourse) for 2 weeks after the procedure.  Only take over-the-counter or prescription medicines as directed by your health care provider. Do not take aspirin. It can cause bleeding.  Follow up with your health care provider as directed. SEEK MEDICAL CARE IF:   You have increasing cramps or pain that is not relieved with medicine.  You have abdominal pain that does not seem to be related to the same area of earlier cramping and pain.  You have bad smelling vaginal discharge.  You have a rash.  You are having problems with any medicine. SEEK IMMEDIATE MEDICAL CARE IF:   You have bleeding that is heavier than  a normal menstrual period.  You have a fever.  You have chest pain.  You have shortness of breath.  You feel dizzy or feel like fainting.  You pass out.  You have pain in your shoulder strap area.  You have heavy vaginal bleeding with or without blood clots. MAKE SURE YOU:   Understand these instructions.  Will watch your condition.  Will get help right away if you are not doing well or get worse.   This information is not intended to replace advice given to you by your health care provider. Make sure you discuss any questions you have with your health care provider.   Document Released: 02/18/2000 Document Revised: 02/25/2013 Document Reviewed: 09/19/2012 Elsevier Interactive Patient Education [employer].

## 2015-11-23 NOTE — Brief Op Note (Signed)
11/23/2015  12:49 PM  PATIENT:  Regina Schultz  50 y.o. female  PRE-OPERATIVE DIAGNOSIS:  PMB, endometrial polyp, hx breast cancer  POST-OPERATIVE DIAGNOSIS:   PMB, endometrial polyp, hx breast cancer   PROCEDURE:  Procedure(s): DILATATION & CURETTAGE/HYSTEROSCOPY WITH MYOSURE (N/A)  SURGEON:  Surgeon(s) and Role:    * Nerine Pulse E Yisroel Ramming, MD - Primary  PHYSICIAN ASSISTANT: NA  ASSISTANTS: none   ANESTHESIA:   local and LMA.  EBL:  Total I/O In: 1500 [I.V.:1500] Out: 105 [Urine:100; Blood:5]  BLOOD ADMINISTERED:none  DRAINS: none   LOCAL MEDICATIONS USED:  LIDOCAINE  and Amount: 10 ml  SPECIMEN:  Source of Specimen:  endometrial polyp, endometrial curettings.  DISPOSITION OF SPECIMEN:  PATHOLOGY  COUNTS:  YES  TOURNIQUET:  * No tourniquets in log *  DICTATION: .Note written in [place]: Discharge to home after PACU  PATIENT DISPOSITION:  PACU - hemodynamically stable.   Delay start of Pharmacological VTE agent (>24hrs) due to surgical blood loss or risk of bleeding: not applicable

## 2015-11-23 NOTE — Anesthesia Postprocedure Evaluation (Signed)
Anesthesia Post Note  Patient: Regina Schultz  Procedure(s) Performed: Procedure(s) (LRB): DILATATION & CURETTAGE/HYSTEROSCOPY WITH MYOSURE (N/A)  Patient location during evaluation: PACU Anesthesia Type: General Level of consciousness: awake Pain management: pain level controlled Vital Signs Assessment: post-procedure vital signs reviewed and stable Respiratory status: spontaneous breathing Cardiovascular status: stable Postop Assessment: no signs of nausea or vomiting Anesthetic complications: no     Last Vitals:  Vitals:   11/23/15 1315 11/23/15 1330  BP: (!) 102/58 (!) 93/54  Pulse: (!) 48 (!) 51  Resp: (!) 9 15  Temp:      Last Pain:  Vitals:   11/23/15 1002  TempSrc: Oral   Pain Goal: Patients Stated Pain Goal: 4 (11/23/15 1002)               [place]

## 2015-11-23 NOTE — Anesthesia Procedure Notes (Signed)
Procedure Name: LMA Insertion Date/Time: 11/23/2015 12:05 PM Performed by: Hewitt Blade Pre-anesthesia Checklist: Patient identified, Suction available, Patient being monitored and Emergency Drugs available Patient Re-evaluated:Patient Re-evaluated prior to inductionOxygen Delivery Method: Circle system utilized Preoxygenation: Pre-oxygenation with 100% oxygen Intubation Type: IV induction LMA: LMA inserted Number of attempts: 1 Placement Confirmation: positive ETCO2 and breath sounds checked- equal and bilateral Tube secured with: Tape Dental Injury: Teeth and Oropharynx as per pre-operative assessment
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
## 2015-11-23 NOTE — Anesthesia Preprocedure Evaluation (Signed)
Anesthesia Evaluation  Patient identified by MRN, date of birth, ID band Patient awake    Reviewed: Allergy & Precautions, H&P , NPO status , Patient's Chart, lab work & pertinent test results  Airway Mallampati: I  TM Distance: >3 FB Neck ROM: full    Dental no notable dental hx. (+) Teeth Intact   Pulmonary neg pulmonary ROS, Current Smoker,    Pulmonary exam normal        Cardiovascular Normal cardiovascular exam     Neuro/Psych negative neurological ROS  negative psych ROS   GI/Hepatic negative GI ROS, Neg liver ROS,   Endo/Other  negative endocrine ROS  Renal/GU negative Renal ROS     Musculoskeletal   Abdominal Normal abdominal exam  (+)   Peds  Hematology negative hematology ROS (+)   Anesthesia Other Findings   Reproductive/Obstetrics negative OB ROS                             Anesthesia Physical Anesthesia Plan  ASA: II  Anesthesia Plan: General   Post-op Pain Management:    Induction: Intravenous  Airway Management Planned: LMA  Additional Equipment:   Intra-op Plan:   Post-operative Plan:   Informed Consent: I have reviewed the patients History and Physical, chart, labs and discussed the procedure including the risks, benefits and alternatives for the proposed anesthesia with the patient or authorized representative who has indicated his/her understanding and acceptance.   Dental Advisory Given  Plan Discussed with: CRNA and Surgeon  Anesthesia Plan Comments:         Anesthesia Quick Evaluation

## 2015-11-23 NOTE — Op Note (Signed)
OPERATIVE REPORT   PREOPERATIVE DIAGNOSES:   Postmenopausal bleeding, endometrial polyp.  POSTOPERATIVE DIAGNOSES:     Postmenopausal bleeding, endometrial polyp.  PROCEDURE:  Hysteroscopy with dilation and curettage and Myosure resection of endometrial polyp.  SURGEON:  Lenard Galloway, MD  ANESTHESIA:  LMA, paracervical block with 10 mL of 1% lidocaine.  IV FLUIDS:   1500 cc.   EBL:  5 cc.  URINE OUTPUT: 100 cc.  NORMAL SALINE DEFICIT:   230 cc.   COMPLICATIONS:  None.  INDICATIONS FOR THE PROCEDURE:     A plan is now made to proceed with a hysteroscopy with dilation and curettage and Myosure resection of endometrial polyp, after risks, benefits and alternatives were reviewed.  FINDINGS:  Exam under anesthesia revealed a small anteverted uterus. The uterus was sounded to 6 cm. Hysteroscopy showed a very columnar shaped endometrial cavity.   Fluffy polypoid tissue was noted attached to the posterior uterine wall and the left uterine sidewall. There were adhesions at the left fundus.  Both tubal ostia regions were normal.  Endometrial currettings were small in amount.  SPECIMENS:  The endometrial polyp and endometrial curettings were sent to Pathology separately.   PROCEDURE IN DETAIL:  The patient was reidentified in the preoperative hold area.  She received TED hose and PAS stockings for DVT prophylaxis.  In the operating room, the patient was placed in the dorsal lithotomy position and then an LMA anesthetic was introduced.  The patient's lower abdomen, vagina and perineum were sterilely prepped with Betadine and the  patient's bladder was catheterized of urine.  She was sterilly draped  An exam under anesthesia was performed.  A speculum was placed inside the vagina and a single-tooth tenaculum was placed on the anterior cervical lip.  A paracervical block was performed with a total of 10 mL of 1% lidocaine plain.  The uterus was  sounded. The cervix was dilated to a #21 Pratt dilator.  The MyoSure hysteroscope was then inserted inside the uterine cavity under the continuous infusion of normal saline solution.  Findings are as noted above.  The MyoSure hysteroscope was used to resect the polypoid areas, and it was then removed.    The cervix was further dilated to a #23 Pratt dilator.  The sharp and then a  serrated curette were introduced into the uterine cavity and the endometrium was curetted in all 4 quadrants.  A minimal amount of endometrial curettings was obtained.  This specimen was sent to Pathology.  The single-tooth tenaculum which had been placed on the anterior cervical lip was removed.  Hemostasis was good, and all of the vaginal  instruments were removed.  The patient was awakened and escorted to the recovery room in stable condition after she was cleansed with Betadine.  There were no complications to the procedure.  All needle, instrument and sponge counts were correct.  Lenard Galloway, MD

## 2015-11-23 NOTE — Transfer of Care (Signed)
Immediate Anesthesia Transfer of Care Note  Patient: Regina Schultz  Procedure(s) Performed: Procedure(s): DILATATION & CURETTAGE/HYSTEROSCOPY WITH MYOSURE (N/A)  Patient Location: PACU  Anesthesia Type:General  Level of Consciousness: awake, alert  and oriented  Airway & Oxygen Therapy: Patient Spontanous Breathing and Patient connected to nasal cannula oxygen  Post-op Assessment: Report given to RN and Post -op Vital signs reviewed and stable  Post vital signs: Reviewed and stable  Last Vitals:  Vitals:   11/23/15 1002  BP: 105/74  Pulse: 66  Resp: 16  Temp: 36.7 C    Last Pain:  Vitals:   11/23/15 1002  TempSrc: Oral      Patients Stated Pain Goal: 4 ([id number])  Complications: No apparent anesthesia complications

## 2015-11-24 ENCOUNTER — Encounter (HOSPITAL_COMMUNITY): Payer: Self-pay | Admitting: Obstetrics and Gynecology

## 2015-12-06 ENCOUNTER — Telehealth: Payer: Self-pay | Admitting: *Deleted

## 2015-12-06 NOTE — Telephone Encounter (Signed)
"  Please give me a call.  I had been in for an appointment and talked about having surgery.  I would like to ask a few more questions about that."

## 2015-12-07 NOTE — Telephone Encounter (Signed)
"  I saw Dr. Jacqualyn Posey and he recommended I have surgery.  I need to know how long I will be wearing that boot and if I can drive in it."  What procedure are you having?  "He said something about breaking my toe and repositioning it."  He mentions a Hammer Toe Procedure in his notes.  You will not be in a boot.  You will be wearing a surgical shoe.  "Will I be able to drive the day after surgery?"  If you are under the influence of Narcotics you will not be able to drive.  Driving could possibly cause pain as well.  "I think I will be okay being home for a couple of days after the surgery but only for a couple of days.  I have to be at work.  What days does he do surgery on?"  He does surgery on Wednesdays.  "Does he have anything on October 25 or October 18?"  Yes, he does have time available on those days.  "Well let me check my calendar at work and give you a call back either today or tomorrow."

## 2015-12-08 ENCOUNTER — Other Ambulatory Visit: Payer: Self-pay | Admitting: Obstetrics and Gynecology

## 2015-12-08 NOTE — Telephone Encounter (Signed)
Medication refill request: buPROPion 300mg  Last AEX:  11/25/14 BS Next AEX: 12/10/15  Last MMG (if hormonal medication request): 03/20/13 MRI--(Bil mastectomy & reconstruction 2006) MRI neg/BIRADS2:[facility] Refill authorized: 07/13/15 #90 w/1 refill; today #90?

## 2015-12-10 ENCOUNTER — Ambulatory Visit (INDEPENDENT_AMBULATORY_CARE_PROVIDER_SITE_OTHER): Payer: 59 | Admitting: Obstetrics and Gynecology

## 2015-12-10 ENCOUNTER — Encounter: Payer: Self-pay | Admitting: Obstetrics and Gynecology

## 2015-12-10 VITALS — BP 112/66 | HR 68 | Resp 16 | Ht 64.0 in | Wt 129.0 lb

## 2015-12-10 DIAGNOSIS — Z23 Encounter for immunization: Secondary | ICD-10-CM | POA: Diagnosis not present

## 2015-12-10 DIAGNOSIS — Z0000 Encounter for general adult medical examination without abnormal findings: Secondary | ICD-10-CM

## 2015-12-10 DIAGNOSIS — Z9189 Other specified personal risk factors, not elsewhere classified: Secondary | ICD-10-CM | POA: Diagnosis not present

## 2015-12-10 DIAGNOSIS — Z01419 Encounter for gynecological examination (general) (routine) without abnormal findings: Secondary | ICD-10-CM | POA: Diagnosis not present

## 2015-12-10 LAB — POCT URINALYSIS DIPSTICK
Bilirubin, UA: NEGATIVE
GLUCOSE UA: NEGATIVE
Ketones, UA: NEGATIVE
NITRITE UA: NEGATIVE
PROTEIN UA: NEGATIVE
RBC UA: NEGATIVE
UROBILINOGEN UA: NEGATIVE
pH, UA: 6

## 2015-12-10 MED ORDER — BUPROPION HCL ER (XL) 300 MG PO TB24: 300.0000 mg | ORAL_TABLET | Freq: Every day | ORAL | 0 refills | Status: DC
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
## 2015-12-10 NOTE — Patient Instructions (Signed)

## 2015-12-15 ENCOUNTER — Ambulatory Visit: Payer: 59 | Admitting: Obstetrics and Gynecology

## 2015-12-16 LAB — PAP IG AND HPV HIGH-RISK
HPV, high-risk: NEGATIVE
PAP SMEAR COMMENT: 0

## 2015-12-21 NOTE — Telephone Encounter (Signed)
"  I want to get an estimate again for my little toe (Hammer toe).  I have met my deductible so I would like to know how much I will have to pay out of pocket." I will have to get Jocelyn Lamer to give you a call back regarding an estimate.  "Will I be able to go back to work the next day after I have this done?"  It normally takes 4-6 weeks for healing.  If you have a sit down job, you can go back to work after a week.  "I thought I would be able to go back the following day.  I am going to have to rethink this."  You are going to need to see Dr. Jacqualyn Posey for a consultation.  During that consultation he will give you detailed information regarding the procedure.  "I guess I should do that.  Can you schedule that appointment for me?"  I will transfer you to a scheduler.

## 2015-12-22 ENCOUNTER — Telehealth: Payer: Self-pay | Admitting: *Deleted

## 2015-12-22 NOTE — Telephone Encounter (Addendum)
Pt states she would like to discuss hammer toe surgery. Left message to call again to discuss hammer toe surgery. 12/24/2015-I spoke with pt and she asked if she could go back to work the day after surgery. I told pt the doctors would not release her to go back to work until the 1st post op visit 7-10 days later and that was if she had a sitdown job. Pt states she was thinking she would like to schedule the Wednesday before Thanksgiving. I told pt she would need to schedule to sign consents and to get on the schedule for surgery. Transferred pt to shedulers.

## 2015-12-24 ENCOUNTER — Telehealth: Payer: Self-pay | Admitting: Obstetrics and Gynecology

## 2015-12-24 NOTE — Telephone Encounter (Signed)
Spoke with patient. Patient scheduled for 12/29/15 at 8:15am with Dr. Quincy Simmonds. Patient agreeable to date and time.   Routing to provider for final review. Patient is agreeable to disposition. Will close encounter.

## 2015-12-24 NOTE — Telephone Encounter (Signed)
Spoke with patient. Patient states since having D&C on 11/23/15 she has been having spotting following intercourse, spotting stops after. Patient states she thought the bleeding would have stopped after surgery. Patient states she waited 2 weeks after surgery to have intercourse. Patient states same partner and does not use lubrication. Denies any vaginal or pelvic pain. Last AEX 12/10/15. Advised patient I would need to review with Dr. Quincy Simmonds for recommendations and return call. Patient is agreeable.     Dr. Quincy Simmonds, please advise?

## 2015-12-24 NOTE — Telephone Encounter (Signed)
Patient called and said, "I am having some bleeding after sex and need to know if this is normal. I had surgery about a month ago (11/23/15) with Dr. Quincy Simmonds."

## 2015-12-24 NOTE — Telephone Encounter (Signed)
Please make an appointment for me to see patient next week in office.  I need to be able to exam her in order to help best.

## 2015-12-28 ENCOUNTER — Telehealth: Payer: Self-pay | Admitting: *Deleted

## 2015-12-28 ENCOUNTER — Telehealth: Payer: Self-pay | Admitting: Obstetrics and Gynecology

## 2015-12-28 NOTE — Telephone Encounter (Signed)
Patient canceled appointment at 10:48 am today for 12/29/15. She states her child is sick with mono. She will call back to reschedule this appointment. Patient is aware of [facility] POLICY.

## 2015-12-28 NOTE — Telephone Encounter (Signed)
Thank you for the update. You may close the encounter. 

## 2015-12-28 NOTE — Telephone Encounter (Signed)
"  If you could call me back.  I'm calling about scheduling a Hammer Toe surgery with Dr. Jacqualyn Posey."  I am returning your call.  How can I help you?  "I would like to know if Dr. Jacqualyn Posey still has anything available the day before Thanksgiving for surgery."  Yes, he still has time available.  "Can I call you back to discuss this?  I am in the middle of a meeting."  Have you signed consent forms?  If you have not you will need to schedule a consultation with Dr. Jacqualyn Posey to sign consent forms then we can get you scheduled for surgery.  "Do I just call the [place] office?"  Yes, just give them a call to schedule an appointment.  "Okay, thank you."

## 2015-12-29 ENCOUNTER — Ambulatory Visit: Payer: Self-pay | Admitting: Obstetrics and Gynecology

## 2016-01-05 ENCOUNTER — Ambulatory Visit: Payer: Self-pay | Admitting: Obstetrics and Gynecology

## 2016-01-06 ENCOUNTER — Telehealth: Payer: Self-pay | Admitting: *Deleted

## 2016-01-06 NOTE — Telephone Encounter (Signed)
"  I have spoke to you several times about scheduling a surgery with Dr. Jacqualyn Posey.  Give me a call back I would like to discuss scheduling a date."

## 2016-01-12 NOTE — Telephone Encounter (Signed)
"  If you could, give me a call back.  Thank you."  I am returning your call.  How can I help you?  "I know I have called you several times about surgery with questions.  Does he still have something available the Wednesday before Thanksgiving?"  Yes, he still has availability.  "What about the second Wednesday of December, does he have anything available then?"  Yes, he does.  "Okay let me decide what I want to do and give you a call back."  Have you signed consent forms for surgery yet?  "No, I don't think I have.  I had an appointment with him."  Did you sign a 3 page consent form and get a surgical bag?  "No, I need to schedule an appointment to see him.  Do I need to call back tomorrow to get an appointment scheduled?"  Yes, call back and schedule a consultation.

## 2016-01-13 ENCOUNTER — Ambulatory Visit: Payer: Self-pay | Admitting: Obstetrics and Gynecology

## 2016-01-13 ENCOUNTER — Ambulatory Visit (INDEPENDENT_AMBULATORY_CARE_PROVIDER_SITE_OTHER): Payer: 59 | Admitting: Obstetrics and Gynecology

## 2016-01-13 ENCOUNTER — Encounter: Payer: Self-pay | Admitting: Obstetrics and Gynecology

## 2016-01-13 VITALS — BP 130/72 | HR 66 | Ht 64.0 in | Wt 132.2 lb

## 2016-01-13 DIAGNOSIS — N930 Postcoital and contact bleeding: Secondary | ICD-10-CM | POA: Diagnosis not present

## 2016-01-13 DIAGNOSIS — N952 Postmenopausal atrophic vaginitis: Secondary | ICD-10-CM

## 2016-01-13 DIAGNOSIS — F4320 Adjustment disorder, unspecified: Secondary | ICD-10-CM

## 2016-01-13 DIAGNOSIS — F4321 Adjustment disorder with depressed mood: Secondary | ICD-10-CM
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
## 2016-01-20 ENCOUNTER — Other Ambulatory Visit: Payer: Self-pay | Admitting: Emergency Medicine

## 2016-01-20 DIAGNOSIS — C50412 Malignant neoplasm of upper-outer quadrant of left female breast: Secondary | ICD-10-CM

## 2016-01-24 ENCOUNTER — Encounter: Payer: Self-pay | Admitting: *Deleted

## 2016-01-24 ENCOUNTER — Ambulatory Visit (HOSPITAL_BASED_OUTPATIENT_CLINIC_OR_DEPARTMENT_OTHER): Payer: 59 | Admitting: Oncology

## 2016-01-24 ENCOUNTER — Other Ambulatory Visit (HOSPITAL_BASED_OUTPATIENT_CLINIC_OR_DEPARTMENT_OTHER): Payer: 59

## 2016-01-24 VITALS — BP 128/75 | HR 65 | Temp 98.1°F | Resp 18 | Wt 132.7 lb

## 2016-01-24 DIAGNOSIS — Z170 Estrogen receptor positive status [ER+]: Secondary | ICD-10-CM

## 2016-01-24 DIAGNOSIS — Z853 Personal history of malignant neoplasm of breast: Secondary | ICD-10-CM | POA: Diagnosis not present

## 2016-01-24 DIAGNOSIS — Z006 Encounter for examination for normal comparison and control in clinical research program: Secondary | ICD-10-CM

## 2016-01-24 DIAGNOSIS — C50412 Malignant neoplasm of upper-outer quadrant of left female breast: Secondary | ICD-10-CM

## 2016-01-24 LAB — CBC WITH DIFFERENTIAL/PLATELET
BASO%: 1 % (ref 0.0–2.0)
BASOS ABS: 0.1 10*3/uL (ref 0.0–0.1)
EOS ABS: 0.1 10*3/uL (ref 0.0–0.5)
EOS%: 1.7 % (ref 0.0–7.0)
HEMATOCRIT: 41.5 % (ref 34.8–46.6)
HEMOGLOBIN: 13.8 g/dL (ref 11.6–15.9)
LYMPH#: 1.9 10*3/uL (ref 0.9–3.3)
LYMPH%: 30.2 % (ref 14.0–49.7)
MCH: 30.6 pg (ref 25.1–34.0)
MCHC: 33.3 g/dL (ref 31.5–36.0)
MCV: 91.9 fL (ref 79.5–101.0)
MONO#: 0.6 10*3/uL (ref 0.1–0.9)
MONO%: 8.9 % (ref 0.0–14.0)
NEUT#: 3.7 10*3/uL (ref 1.5–6.5)
NEUT%: 58.2 % (ref 38.4–76.8)
Platelets: 268 10*3/uL (ref 145–400)
RBC: 4.52 10*6/uL (ref 3.70–5.45)
RDW: 12.3 % (ref 11.2–14.5)
WBC: 6.3 10*3/uL (ref 3.9–10.3)

## 2016-01-24 LAB — COMPREHENSIVE METABOLIC PANEL
ALBUMIN: 3.9 g/dL (ref 3.5–5.0)
ALK PHOS: 89 U/L (ref 40–150)
ALT: 28 U/L (ref 0–55)
AST: 23 U/L (ref 5–34)
Anion Gap: 9 mEq/L (ref 3–11)
BILIRUBIN TOTAL: 0.6 mg/dL (ref 0.20–1.20)
BUN: 12.7 mg/dL (ref 7.0–26.0)
CALCIUM: 9.5 mg/dL (ref 8.4–10.4)
CO2: 27 mEq/L (ref 22–29)
Chloride: 103 mEq/L (ref 98–109)
Creatinine: 0.8 mg/dL (ref 0.6–1.1)
EGFR: 87 mL/min/{1.73_m2} — AB (ref >90)
Glucose: 84 mg/dl (ref 70–140)
POTASSIUM: 3.8 meq/L (ref 3.5–5.1)
Sodium: 139 mEq/L (ref 136–145)
TOTAL PROTEIN: 7.1 g/dL (ref 6.4–8.3)

## 2016-01-24 NOTE — Progress Notes (Signed)
01/24/2016 Patient in to clinic today for routine annual follow-up visit. She reports that she is fully active (KPS = 100%) with no serious health problems over the past year. Information regarding her gynecologic procedures this year were previously obtained and reviewed by Dr. Jana Hakim, with findings of benign endometrial polyps. Patient reports no menses occurred in the past year, but that the workup was done due to intermittent spotting. Following history and physical exam by Dr. Jana Hakim, patient and physician made a decision to transition her care to her gynecologist for annual assessments. Patient is aware that she will be contacted by the research nurse by phone for any additional information that is needed on an annual basis. She is aware that annual study follow-up occurs for life, or until permanent study closure, which is not anticipated at this time. 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 Thanked patient for her participation in this study over the past 12 years. Cindy S. Brigitte Pulse BSN, RN, CCRP 01/24/2016 10:06 AM
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
## 2016-03-01 ENCOUNTER — Encounter: Payer: Self-pay | Admitting: Obstetrics and Gynecology

## 2016-03-01 ENCOUNTER — Telehealth: Payer: Self-pay | Admitting: Obstetrics and Gynecology

## 2016-03-01 ENCOUNTER — Ambulatory Visit (INDEPENDENT_AMBULATORY_CARE_PROVIDER_SITE_OTHER): Payer: 59 | Admitting: Obstetrics and Gynecology

## 2016-03-01 VITALS — BP 102/60 | HR 64 | Resp 16 | Wt 132.0 lb

## 2016-03-01 DIAGNOSIS — N898 Other specified noninflammatory disorders of vagina: Secondary | ICD-10-CM

## 2016-03-01 DIAGNOSIS — R8290 Unspecified abnormal findings in urine: Secondary | ICD-10-CM | POA: Diagnosis not present

## 2016-03-01 LAB — POCT URINALYSIS DIPSTICK
BILIRUBIN UA: NEGATIVE
GLUCOSE UA: NEGATIVE
KETONES UA: NEGATIVE
Nitrite, UA: NEGATIVE
PH UA: 7
Protein, UA: NEGATIVE
Urobilinogen, UA: NEGATIVE

## 2016-03-01 NOTE — Telephone Encounter (Signed)
Patient wants to speak with nurse no information given. °

## 2016-03-01 NOTE — Telephone Encounter (Signed)
Spoke with patient. Patient states that she feels she may a UTI. Over the last week she has been having intermittent vaginal odor with urination. Denies any fever, chills, lower back pain, or burning with urination. Advised she will need to be seen for further evaluation. Patient is agreeable. Appointment scheduled for 03/01/2016 at 3:30 pm with Dr.Silva. Patient is agreeable to date and time.  Routing to provider for final review. Patient agreeable to disposition. Will close encounter.
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
## 2016-03-02 LAB — WET PREP BY MOLECULAR PROBE
Candida species: NEGATIVE
GARDNERELLA VAGINALIS: POSITIVE — AB
Trichomonas vaginosis: NEGATIVE

## 2016-03-02 LAB — URINALYSIS, MICROSCOPIC ONLY
BACTERIA UA: NONE SEEN [HPF]
CRYSTALS: NONE SEEN [HPF]
Casts: NONE SEEN [LPF]
WBC UA: NONE SEEN WBC/HPF (ref <=5)
Yeast: NONE SEEN [HPF]

## 2016-03-02 LAB — URINE CULTURE

## 2016-03-03 ENCOUNTER — Other Ambulatory Visit: Payer: Self-pay | Admitting: *Deleted

## 2016-03-03 MED ORDER — METRONIDAZOLE 500 MG PO TABS: 500.0000 mg | ORAL_TABLET | Freq: Two times a day (BID) | ORAL | 0 refills | Status: DC

## 2016-03-06 DIAGNOSIS — Z22321 Carrier or suspected carrier of Methicillin susceptible Staphylococcus aureus: Secondary | ICD-10-CM

## 2016-03-06 DIAGNOSIS — I609 Nontraumatic subarachnoid hemorrhage, unspecified: Secondary | ICD-10-CM

## 2016-03-06 HISTORY — DX: Carrier or suspected carrier of methicillin susceptible Staphylococcus aureus: Z22.321

## 2016-03-06 HISTORY — DX: Nontraumatic subarachnoid hemorrhage, unspecified: I60.9

## 2016-04-05 ENCOUNTER — Telehealth: Payer: Self-pay | Admitting: Obstetrics and Gynecology

## 2016-04-05 NOTE — Telephone Encounter (Signed)
Spoke with patient. Patient states she started bleeding 04/04/16. Patient states more than spotting, but not heavy. Patient denies any urinary complaints, fever, nausea.  Patient states this has happened before in the past and she has had a D&C.  Patient states she had intercourse on Friday and was painful on left side, no intercourse since. Patient states she is postmenopausal. Recommended OV for further evaluation. Patient states she needs a early morning appointment time, patient scheduled for 2/1 at 0830 with Dr. Quincy Simmonds. Patient is agreeable to date and time.  Routing to provider for final review. Patient is agreeable to disposition. Will close encounter.

## 2016-04-05 NOTE — Telephone Encounter (Signed)
Patient wants to speak with the nurse no information given. °

## 2016-04-06 ENCOUNTER — Encounter: Payer: Self-pay | Admitting: Obstetrics and Gynecology

## 2016-04-06 ENCOUNTER — Ambulatory Visit (INDEPENDENT_AMBULATORY_CARE_PROVIDER_SITE_OTHER): Payer: 59 | Admitting: Obstetrics and Gynecology

## 2016-04-06 VITALS — BP 110/70 | HR 60 | Ht 64.0 in | Wt 131.4 lb

## 2016-04-06 DIAGNOSIS — N939 Abnormal uterine and vaginal bleeding, unspecified: Secondary | ICD-10-CM | POA: Diagnosis not present

## 2016-04-06 LAB — FOLLICLE STIMULATING HORMONE: FSH: 62.7 m[IU]/mL

## 2016-04-06 LAB — ESTRADIOL: ESTRADIOL: 24 pg/mL
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
## 2016-04-10 ENCOUNTER — Telehealth: Payer: Self-pay | Admitting: Obstetrics and Gynecology

## 2016-04-10 ENCOUNTER — Other Ambulatory Visit: Payer: Self-pay | Admitting: *Deleted

## 2016-04-10 DIAGNOSIS — N950 Postmenopausal bleeding: Secondary | ICD-10-CM

## 2016-04-10 NOTE — Telephone Encounter (Signed)
Patient would like to speak with nurse about her results.

## 2016-04-10 NOTE — Telephone Encounter (Signed)
Spoke with patient, advised of results and recommendations as seen below per Dr. Quincy Simmonds. Patient states bleeding has gotten lighter. Patient scheduled for [facility] with EMB on 04/13/16 at 0830 with Dr. Quincy Simmonds. Advised to take Motrin 800 mg with food and water one hour before procedure. Patient verbalizes understanding and is agreeable to date and time.   Notes Recorded by Nunzio Cobbs, MD on 04/08/2016 at 10:04 AM EST Please inform patient that her labs indicate menopause.  FSH is 62.7 and estradiol is 24. I am recommending she return for sonohysterogram and EMB with me.  Please send to precert. We can talk further about surgical options at her visit. At her last visit we started discussing hysterectomy for recurrent postmenopausal bleeding versus another hysteroscopy.  Routing to provider for final review. Patient is agreeable to disposition. Will close encounter.

## 2016-04-13 ENCOUNTER — Other Ambulatory Visit (INDEPENDENT_AMBULATORY_CARE_PROVIDER_SITE_OTHER): Payer: 59 | Admitting: Obstetrics and Gynecology

## 2016-04-13 ENCOUNTER — Telehealth: Payer: Self-pay | Admitting: Obstetrics and Gynecology

## 2016-04-13 ENCOUNTER — Ambulatory Visit: Payer: 59

## 2016-04-13 NOTE — Telephone Encounter (Signed)
Routing this message to Triage.  [place]

## 2016-04-13 NOTE — Telephone Encounter (Signed)
Patient came in for appointment with cold symptoms. Please reschedule her ultrasound.

## 2016-04-13 NOTE — Telephone Encounter (Signed)
Spoke with patient. SHGM and possible EMB scheduled for 04/27/2016 at 9 am with 9:30 am consult with Dr.SIlva. Patient is agreeable to date and time.  Routing to provider for final review. Patient agreeable to disposition. Will close encounter.

## 2016-04-27 ENCOUNTER — Encounter: Payer: Self-pay | Admitting: Obstetrics and Gynecology

## 2016-04-27 ENCOUNTER — Other Ambulatory Visit: Payer: Self-pay | Admitting: Obstetrics and Gynecology

## 2016-04-27 ENCOUNTER — Ambulatory Visit (INDEPENDENT_AMBULATORY_CARE_PROVIDER_SITE_OTHER): Payer: 59 | Admitting: Obstetrics and Gynecology

## 2016-04-27 ENCOUNTER — Ambulatory Visit (INDEPENDENT_AMBULATORY_CARE_PROVIDER_SITE_OTHER): Payer: 59

## 2016-04-27 VITALS — BP 110/70 | HR 56 | Ht 64.0 in | Wt 126.0 lb

## 2016-04-27 DIAGNOSIS — N950 Postmenopausal bleeding: Secondary | ICD-10-CM
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
## 2016-04-27 NOTE — Patient Instructions (Signed)

## 2016-04-27 NOTE — Progress Notes (Signed)
Encounter reviewed by Dr. Jourdyn Hasler Amundson C. Silva.  

## 2016-05-03 ENCOUNTER — Telehealth: Payer: Self-pay | Admitting: Obstetrics and Gynecology

## 2016-05-03 LAB — PATHOLOGY

## 2016-05-03 NOTE — Telephone Encounter (Signed)
I just sent through a separate results note to "Triage" regarding benign polyp noted on EMB. Options for care again are hysteroscopic polypectomy with dilation and curettage versus hysterectomy.

## 2016-05-03 NOTE — Telephone Encounter (Signed)
Dr. Quincy Simmonds, please review pathology results dated 04/27/16 and advise?

## 2016-05-03 NOTE — Telephone Encounter (Signed)
Spoke with patient, advised of results and recommendations as seen below per Dr. Quincy Simmonds. Patient would like to know if Dr. Quincy Simmonds feels strongly about the surgical approach and which surgery? Patient would also like to know if hysterectomy, can surgery can be put off until mid June? Recommended OV for further discussion, patient declined at this time. Patient states she will schedule OV if Dr. Quincy Simmonds feels necessary. Advised patient would review concerns with Dr. Quincy Simmonds and return call with recommendations, patient is agreeable.  Dr. Quincy Simmonds, please advise?   Notes Recorded by Nunzio Cobbs, MD on 05/03/2016 at 3:52 PM EST Please report results of EMB to patient which show benign endometrial polyp. Options for care include hysteroscopic polypectomy with dilation and curettage or hysterectomy. We talked about both of these options at her last visit.  She seems to be forming the polyps frequently.

## 2016-05-03 NOTE — Telephone Encounter (Signed)
Patient would like her results 

## 2016-05-05 NOTE — Telephone Encounter (Signed)
Return call to patient. Per ROI, can leave detailed message on voice mail. Voice mail confirms name. Left message available dates for June include 6-5 and 08-22-16. Advised will begin insurance precert as well. Left message to call back.

## 2016-05-05 NOTE — Telephone Encounter (Signed)
I would have the patient consider hysterectomy because I am not able to control or prevent her from having further polyps.  I think it is ok to wait until June but not later than that. If she wishes to do the hysteroscopy with polyp removal and dilation and curettage, that is acceptable. I hope this helps her.

## 2016-05-05 NOTE — Telephone Encounter (Signed)
Spoke with patient, advised as seen below per Dr. Quincy Simmonds. Patient states she would like to speak with surgery scheduler to see what dates are available in June for hysterectomy. Advised patient will update Dr. Quincy Simmonds and forward message to Gay Filler, our nursing supervisor, who can advise further on surgery scheduling. Patient verbalizes understanding and is agreeable.

## 2016-05-05 NOTE — Telephone Encounter (Signed)
Procedure would be total laparoscopic hysterectomy with bilateral salpingectomy with cystoscopy.   Cc- Lamont Snowball, [name]

## 2016-05-08 NOTE — Telephone Encounter (Signed)
Called patient to review benefits for a recommended surgical procedure. Left Voicemail requesting a call back. ° °Cc: Sally Yeakley °

## 2016-05-09 NOTE — Telephone Encounter (Signed)
Patient returned your call.

## 2016-05-10 NOTE — Telephone Encounter (Signed)
Return call to patient. Per ROI can leave message on [phone] and voice mail has name confirmation. Left message that June 5 and June 19 are available dates for June surgery. She can consider these and call back to let me what dates she prefers.

## 2016-05-10 NOTE — Telephone Encounter (Signed)
Spoke with patient in regards to benefits. Patient understood and is agreeable. Before committing to surgery, patient request to speak with nurse supervisor, to confirm availability. Patient is agreeable to a return call.   Routing to [employer]

## 2016-05-10 NOTE — Telephone Encounter (Signed)
Patient wants to schedule surgery  

## 2016-05-11 ENCOUNTER — Telehealth: Payer: Self-pay | Admitting: Obstetrics and Gynecology

## 2016-05-11 NOTE — Telephone Encounter (Signed)
Patient called requesting to speak with Regina Schultz about questions she has related to surgery.

## 2016-05-11 NOTE — Telephone Encounter (Signed)
Return call to patient. Confirmed requested date of 08-22-16. Surgery instruction sheet reviewed and printed copy will be mailed to patient. All surgical appointments scheduled.  Patient is interested in going home day of surgery. Advised Dr Regina Schultz will discuss this at surgery consult appointment.  Routing to provider for final review. Patient agreeable to disposition. Will close encounter.

## 2016-05-11 NOTE — Telephone Encounter (Signed)
Patient wants to schedule her surgery for 08/22/16 and make her payment.

## 2016-05-11 NOTE — Telephone Encounter (Signed)
Return call to patient. Had questions about FMLA forms and time out of work. Instructed to have forms sent to office. Patient plans to start with 6 weeks out and return earlier if possible.  Routing to provider for final review. Patient agreeable to disposition. Will close encounter.   CC: [name]

## 2016-05-26 ENCOUNTER — Telehealth: Payer: Self-pay | Admitting: Obstetrics and Gynecology

## 2016-05-26 NOTE — Telephone Encounter (Signed)
Patient wants to make sure that you received her disability forms and also has some questions about the forms that she would like to speak with you about.

## 2016-05-29 NOTE — Telephone Encounter (Signed)
Patient would like to speak with you about the information that was sent for her June surgery.

## 2016-05-30 NOTE — Telephone Encounter (Signed)
Spoke with patient in regards to [facility] forms. Patient is scheduled for surgery on 08/22/16. Advised patient forms will be completed on 05/31/16, as patient states she needs to turn in forms as soon as possible.

## 2016-06-05 NOTE — Telephone Encounter (Signed)
Patient called requesting to speak with Deloris Ping about some follow up questions.

## 2016-06-07 NOTE — Telephone Encounter (Signed)
FMLA forms have been completed and signed by provider, as of 06/06/16 and mailed to patients home address, as reqeusted

## 2016-08-02 ENCOUNTER — Encounter: Payer: Self-pay | Admitting: Obstetrics and Gynecology

## 2016-08-02 ENCOUNTER — Ambulatory Visit (INDEPENDENT_AMBULATORY_CARE_PROVIDER_SITE_OTHER): Payer: 59 | Admitting: Obstetrics and Gynecology

## 2016-08-02 VITALS — BP 116/70 | HR 68 | Resp 16 | Ht 64.0 in | Wt 127.0 lb

## 2016-08-02 DIAGNOSIS — N840 Polyp of corpus uteri: Secondary | ICD-10-CM

## 2016-08-02 DIAGNOSIS — N950 Postmenopausal bleeding: Secondary | ICD-10-CM
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
## 2016-08-03 ENCOUNTER — Telehealth: Payer: Self-pay | Admitting: Obstetrics and Gynecology

## 2016-08-03 MED ORDER — BUPROPION HCL ER (XL) 300 MG PO TB24: 300.0000 mg | ORAL_TABLET | Freq: Every day | ORAL | 0 refills | Status: DC

## 2016-08-03 NOTE — Telephone Encounter (Signed)
Patient was seen yesterday with Dr.Silva and has a question.

## 2016-08-03 NOTE — Telephone Encounter (Signed)
Spoke with patient. Patient states she would like to update Dr. Quincy Simmonds on conversation from [place] on 5/30. Patient states she was told she had a staff infection and was hospitalized, given IV antibiotics in 2007. Patient states she was unsure when MRSA was discussed if this was the same thing. Patient states this would have been while at [facility] and implants were removed. Patient also requesting RX for Wellbutrin to [place] for 7 days worth of medication until [facility] order can be delivered. Advised patient would update and review with Dr. Quincy Simmonds and return call with recommendations, patient is agreeable.   Dr. Quincy Simmonds, please review and advise?

## 2016-08-03 NOTE — Telephone Encounter (Signed)
Rx for 7 days of Wellbutrin sent in to her local pharmacy.  Hospital will need to know about her hx of potential MRSA. Please contact preop nurse.

## 2016-08-04 NOTE — Telephone Encounter (Signed)
Spoke with Regina Schultz, [facility] pre-op nurse. Advised of history of potential MRSA. Was advised will look at history, MRSA swab can be completed at PAT appointment or day of surgery, will treat depending on results.

## 2016-08-04 NOTE — Telephone Encounter (Signed)
Left detailed message, ok per current dpr. Advised RX for 7 days of Wellbutrin to pharmacy. Pre-op nurse has been notified of potential history of MRSA, will further evaluate at [facility] pre-op appointment or day of surgery. Advised to return call to office for any additional questions/concerns.   Routing to provider for final review. Patient is agreeable to disposition. Will close encounter.

## 2016-08-04 NOTE — Progress Notes (Signed)
Sharee Pimple from Dr Elza Rafter office called to inform us of patient's potential history of MRSA in 2007.  From [place] telephone encounter dated 5/31, it states patient had staff infection treated with antibiotic at [facility] in 2007.

## 2016-08-09 NOTE — Patient Instructions (Signed)
Your procedure is scheduled on:  Tuesday, August 22, 2016  Enter through the Main Entrance of [facility] at:  6:00 AM  Pick up the phone at the desk and dial [phone].  Call this number if you have problems the morning of surgery: [phone].  Remember: Do NOT eat food or drink after:  Midnight Monday  Take these medicines the morning of surgery with a SIP OF WATER:  Bupropion  Stop ALL herbal medications at this time  Do NOT smoke the day of surgery.  Do NOT wear jewelry (body piercing), metal hair clips/bobby pins, make-up, or nail polish. Do NOT wear lotions, powders, or perfumes.  You may wear deodorant. Do NOT shave for 48 hours prior to surgery. Do NOT bring valuables to the hospital. Contacts, dentures, or bridgework may not be worn into surgery.  Leave suitcase in car.  After surgery it may be brought to your room.  For patients admitted to the hospital, checkout time is 11:00 AM the day of discharge.  Bring a copy of your healthcare power of attorney and living will documents.

## 2016-08-11 ENCOUNTER — Encounter (HOSPITAL_COMMUNITY)
Admission: RE | Admit: 2016-08-11 | Discharge: 2016-08-11 | Disposition: A | Discharge status: Home / Self Care | Payer: 59 | Source: Ambulatory Visit | Attending: Obstetrics and Gynecology | Admitting: Obstetrics and Gynecology

## 2016-08-11 ENCOUNTER — Encounter (HOSPITAL_COMMUNITY): Payer: Self-pay

## 2016-08-11 DIAGNOSIS — Z01812 Encounter for preprocedural laboratory examination: Secondary | ICD-10-CM | POA: Diagnosis not present

## 2016-08-11 LAB — BASIC METABOLIC PANEL
ANION GAP: 7 (ref 5–15)
BUN: 16 mg/dL (ref 6–20)
CO2: 29 mmol/L (ref 22–32)
Calcium: 9.5 mg/dL (ref 8.9–10.3)
Chloride: 102 mmol/L (ref 101–111)
Creatinine, Ser: 0.79 mg/dL (ref 0.44–1.00)
GFR calc Af Amer: >60 mL/min (ref >60)
GFR calc non Af Amer: >60 mL/min (ref >60)
GLUCOSE: 92 mg/dL (ref 65–99)
POTASSIUM: 3.7 mmol/L (ref 3.5–5.1)
Sodium: 138 mmol/L (ref 135–145)

## 2016-08-11 LAB — CBC
HEMATOCRIT: 38.7 % (ref 36.0–46.0)
HEMOGLOBIN: 13.3 g/dL (ref 12.0–15.0)
MCH: 31.4 pg (ref 26.0–34.0)
MCHC: 34.4 g/dL (ref 30.0–36.0)
MCV: 91.3 fL (ref 78.0–100.0)
Platelets: 278 10*3/uL (ref 150–400)
RBC: 4.24 MIL/uL (ref 3.87–5.11)
RDW: 12.6 % (ref 11.5–15.5)
WBC: 6.8 10*3/uL (ref 4.0–10.5)

## 2016-08-11 LAB — TYPE AND SCREEN
ABO/RH(D): O POS
Antibody Screen: NEGATIVE

## 2016-08-11 LAB — ABO/RH: ABO/RH(D): O POS

## 2016-08-14 ENCOUNTER — Encounter: Payer: Self-pay | Admitting: Obstetrics and Gynecology

## 2016-08-14 ENCOUNTER — Telehealth: Payer: Self-pay | Admitting: *Deleted

## 2016-08-14 LAB — SURGICAL PCR SCREEN
MRSA, PCR: NEGATIVE
Staphylococcus aureus: POSITIVE — AB

## 2016-08-14 MED ORDER — MUPIROCIN 2 % EX OINT: 1.0000 "application " | TOPICAL_OINTMENT | Freq: Two times a day (BID) | CUTANEOUS | 0 refills | Status: DC

## 2016-08-14 NOTE — Telephone Encounter (Signed)
Call to patient. Notified of lab results and recommendations for treatment with mupriocin. Instructions reviewed.   Routing to provider for final review. Patient agreeable to disposition. Will close encounter.

## 2016-08-14 NOTE — Telephone Encounter (Signed)
Regina Schultz from [facility] PAT calling to notify Dr Quincy Simmonds patient tested positive for MRSA on pre-operative screening. Recommend pre-treat with Mupirocin ointment. They have already given patient instruction sheet.

## 2016-08-14 NOTE — Pre-Procedure Instructions (Signed)
Gay Filler informed that Ms. Ebersole will need a prescription for Mupirocin.

## 2016-08-15 ENCOUNTER — Encounter: Payer: Self-pay | Admitting: Obstetrics and Gynecology

## 2016-08-21 ENCOUNTER — Telehealth: Payer: Self-pay | Admitting: *Deleted

## 2016-08-21 ENCOUNTER — Telehealth: Payer: Self-pay | Admitting: Obstetrics and Gynecology

## 2016-08-21 ENCOUNTER — Emergency Department: Payer: 59

## 2016-08-21 ENCOUNTER — Emergency Department
Admission: EM | Admit: 2016-08-21 | Discharge: 2016-08-21 | Disposition: A | Discharge status: Home / Self Care | Payer: 59 | Attending: Emergency Medicine | Admitting: Emergency Medicine

## 2016-08-21 DIAGNOSIS — Z9013 Acquired absence of bilateral breasts and nipples: Secondary | ICD-10-CM | POA: Insufficient documentation

## 2016-08-21 DIAGNOSIS — Z853 Personal history of malignant neoplasm of breast: Secondary | ICD-10-CM | POA: Diagnosis not present

## 2016-08-21 DIAGNOSIS — Z79899 Other long term (current) drug therapy: Secondary | ICD-10-CM | POA: Insufficient documentation

## 2016-08-21 DIAGNOSIS — R519 Headache, unspecified: Secondary | ICD-10-CM

## 2016-08-21 DIAGNOSIS — F17210 Nicotine dependence, cigarettes, uncomplicated: Secondary | ICD-10-CM | POA: Insufficient documentation

## 2016-08-21 DIAGNOSIS — R51 Headache: Secondary | ICD-10-CM | POA: Diagnosis not present

## 2016-08-21 LAB — COMPREHENSIVE METABOLIC PANEL
ALBUMIN: 4.4 g/dL (ref 3.5–5.0)
ALK PHOS: 81 U/L (ref 38–126)
ALT: 18 U/L (ref 14–54)
AST: 27 U/L (ref 15–41)
Anion gap: 10 (ref 5–15)
BUN: 12 mg/dL (ref 6–20)
CALCIUM: 9.5 mg/dL (ref 8.9–10.3)
CHLORIDE: 103 mmol/L (ref 101–111)
CO2: 27 mmol/L (ref 22–32)
CREATININE: 0.83 mg/dL (ref 0.44–1.00)
GFR calc non Af Amer: >60 mL/min (ref >60)
GLUCOSE: 116 mg/dL — AB (ref 65–99)
Potassium: 3.5 mmol/L (ref 3.5–5.1)
SODIUM: 140 mmol/L (ref 135–145)
Total Bilirubin: 0.7 mg/dL (ref 0.3–1.2)
Total Protein: 7.5 g/dL (ref 6.5–8.1)

## 2016-08-21 LAB — CBC
HCT: 40.4 % (ref 35.0–47.0)
Hemoglobin: 14.1 g/dL (ref 12.0–16.0)
MCH: 31.9 pg (ref 26.0–34.0)
MCHC: 34.9 g/dL (ref 32.0–36.0)
MCV: 91.4 fL (ref 80.0–100.0)
PLATELETS: 290 10*3/uL (ref 150–440)
RBC: 4.42 MIL/uL (ref 3.80–5.20)
RDW: 12.4 % (ref 11.5–14.5)
WBC: 8.6 10*3/uL (ref 3.6–11.0)

## 2016-08-21 LAB — LIPASE, BLOOD: LIPASE: 31 U/L (ref 11–51)

## 2016-08-21 MED ORDER — KETOROLAC TROMETHAMINE 30 MG/ML IJ SOLN
15.0000 mg | INTRAMUSCULAR | Status: AC
  Administered 2016-08-21: 15 mg via INTRAVENOUS
  Filled 2016-08-21: qty 1

## 2016-08-21 MED ORDER — ONDANSETRON HCL 4 MG/2ML IJ SOLN
INTRAMUSCULAR | Status: DC
Start: 2016-08-21 — End: 2016-08-21
  Filled 2016-08-21: qty 2

## 2016-08-21 MED ORDER — ONDANSETRON HCL 4 MG/2ML IJ SOLN
4.0000 mg | Freq: Once | INTRAMUSCULAR | Status: AC
  Administered 2016-08-21: 4 mg via INTRAVENOUS

## 2016-08-21 MED ORDER — ACETAMINOPHEN 500 MG PO TABS
ORAL_TABLET | ORAL | Status: AC
  Administered 2016-08-21: 1000 mg via ORAL
  Filled 2016-08-21: qty 2

## 2016-08-21 MED ORDER — ACETAMINOPHEN 500 MG PO TABS
1000.0000 mg | ORAL_TABLET | Freq: Once | ORAL | Status: AC
  Administered 2016-08-21: 1000 mg via ORAL

## 2016-08-21 MED ORDER — ONDANSETRON HCL 4 MG/2ML IJ SOLN
4.0000 mg | Freq: Once | INTRAMUSCULAR | Status: AC | PRN
  Administered 2016-08-21: 4 mg via INTRAVENOUS
  Filled 2016-08-21: qty 2

## 2016-08-21 MED ORDER — DIPHENHYDRAMINE HCL 50 MG/ML IJ SOLN
INTRAMUSCULAR | Status: AC
Start: 2016-08-21 — End: 2016-08-21
  Administered 2016-08-21: 25 mg via INTRAVENOUS
  Filled 2016-08-21: qty 1

## 2016-08-21 MED ORDER — IOPAMIDOL (ISOVUE-370) INJECTION 76%
75.0000 mL | Freq: Once | INTRAVENOUS | Status: AC | PRN
  Administered 2016-08-21: 75 mL via INTRAVENOUS
  Filled 2016-08-21: qty 75

## 2016-08-21 MED ORDER — DIPHENHYDRAMINE HCL 50 MG/ML IJ SOLN
25.0000 mg | Freq: Once | INTRAMUSCULAR | Status: AC
  Administered 2016-08-21: 25 mg via INTRAVENOUS

## 2016-08-21 MED ORDER — METOCLOPRAMIDE HCL 5 MG/ML IJ SOLN
INTRAMUSCULAR | Status: AC
  Administered 2016-08-21: 10 mg via INTRAVENOUS
  Filled 2016-08-21: qty 2

## 2016-08-21 MED ORDER — METOCLOPRAMIDE HCL 5 MG/ML IJ SOLN
10.0000 mg | Freq: Once | INTRAMUSCULAR | Status: AC
  Administered 2016-08-21: 10 mg via INTRAVENOUS

## 2016-08-21 NOTE — Telephone Encounter (Signed)
Spoke with patient. Patient states she is in [facility] ED for evaluation of sudden HA that started today. Patient states HA is easing off, first CT negative, now getting ready to have CT angio of head. Patient states she is concerned that she has not started mag citrate in prep for tomorrows scheduled surgery with Dr. Quincy Schultz and wants to know if this will delay scheduled surgery? Advised patient would update Dr. Quincy Schultz and return call with recommendations, patient ask to leave detailed message on mobile number if no answer. Patient is agreeable.   Dr. Quincy Schultz, please review and advise?  Cc: Regina Schultz

## 2016-08-21 NOTE — Telephone Encounter (Signed)
Call to patient. Per instructions from previous call, left message on voice mail advising of recommendation to cancel surgery. Can call back to discuss further once stable.   Hospital notified case canceled.   Encounter closed.

## 2016-08-21 NOTE — ED Triage Notes (Addendum)
Pt arrives to ER via POV c/o sudden onset of headache that began at 1145. Pt reports hearing a "pop" while leaning over, then headache began to top of head. Nauseated with no vomiting. Pt tearful. States that headache is getting better than it was but still severe. Husband at bedside.

## 2016-08-21 NOTE — Telephone Encounter (Signed)
Just clarification regarding her preop screening.  It was negative for MRSA and positive for staph.   Encounter was previously closed.  No further action needed.

## 2016-08-21 NOTE — Discharge Instructions (Signed)
It was a pleasure to take care of you today, and thank you for coming to our emergency department.  If you have any questions or concerns before leaving please ask the nurse to grab me and I'm more than happy to go through your aftercare instructions again.  If you have any concerns once you are home that you are not improving or are in fact getting worse before you can make it to your follow-up appointment, please do not hesitate to call 911 and come back for further evaluation.  You should return to the emergency room immediately if you have new or severe symptoms such as chest pain, difficulty breathing, passing out, high fever, severe pain, or unremitting vomiting.   Available test results from today are listed below.   Regina Mew, MD  Results for orders placed or performed during the hospital encounter of 08/21/16  Lipase, blood  Result Value Ref Range   Lipase 31 11 - 51 U/L  Comprehensive metabolic panel  Result Value Ref Range   Sodium 140 135 - 145 mmol/L   Potassium 3.5 3.5 - 5.1 mmol/L   Chloride 103 101 - 111 mmol/L   CO2 27 22 - 32 mmol/L   Glucose, Bld 116 (H) 65 - 99 mg/dL   BUN 12 6 - 20 mg/dL   Creatinine, Ser 0.83 0.44 - 1.00 mg/dL   Calcium 9.5 8.9 - 10.3 mg/dL   Total Protein 7.5 6.5 - 8.1 g/dL   Albumin 4.4 3.5 - 5.0 g/dL   AST 27 15 - 41 U/L   ALT 18 14 - 54 U/L   Alkaline Phosphatase 81 38 - 126 U/L   Total Bilirubin 0.7 0.3 - 1.2 mg/dL   GFR calc non Af Amer >60 >60 mL/min   GFR calc Af Amer >60 >60 mL/min   Anion gap 10 5 - 15  CBC  Result Value Ref Range   WBC 8.6 3.6 - 11.0 K/uL   RBC 4.42 3.80 - 5.20 MIL/uL   Hemoglobin 14.1 12.0 - 16.0 g/dL   HCT 40.4 35.0 - 47.0 %   MCV 91.4 80.0 - 100.0 fL   MCH 31.9 26.0 - 34.0 pg   MCHC 34.9 32.0 - 36.0 g/dL   RDW 12.4 11.5 - 14.5 %   Platelets 290 150 - 440 K/uL   Ct Angio Head W Or Wo Contrast  Result Date: 08/21/2016 CLINICAL DATA:  Acute onset severe headache at 11 a.m. History of breast cancer.  EXAM: CT ANGIOGRAPHY HEAD TECHNIQUE: Multidetector CT imaging of the head was performed using the standard protocol during bolus administration of intravenous contrast. Multiplanar CT image reconstructions and MIPs were obtained to evaluate the vascular anatomy. CONTRAST:  75 cc Isovue 370 COMPARISON:  CT HEAD August 21, 2016 at 1213 hours FINDINGS: ANTERIOR CIRCULATION: Patent included cervical, petrous, cavernous and supra clinoid internal carotid arteries. Widely patent anterior communicating artery. Patent anterior and middle cerebral arteries. No large vessel occlusion, hemodynamically significant stenosis, contrast extravasation or aneurysm. POSTERIOR CIRCULATION: LEFT vertebral artery is dominant with patent vertebral arteries, vertebrobasilar junction and basilar artery, as well as main branch vessels. RIGHT vertebral artery incompletely imaged though, likely terminating in the posterior inferior cerebellar artery. Patent posterior cerebral arteries. Fetal origin LEFT posterior cerebral artery. Tiny RIGHT posterior communicating artery cysts affected. In No large vessel occlusion, hemodynamically significant stenosis, contrast extravasation or aneurysm. VENOUS SINUSES: Major dural venous sinuses are patent though not tailored for evaluation on this angiographic examination. ANATOMIC VARIANTS: None.  DELAYED PHASE: No abnormal intracranial enhancement. IMPRESSION: Negative CTA HEAD. Electronically Signed   By: Elon Alas M.D.   On: 08/21/2016 15:48   Ct Head Wo Contrast  Result Date: 08/21/2016 CLINICAL DATA:  Headache. EXAM: CT HEAD WITHOUT CONTRAST TECHNIQUE: Contiguous axial images were obtained from the base of the skull through the vertex without intravenous contrast. COMPARISON:  None. FINDINGS: Brain: No evidence of acute infarction, hemorrhage, hydrocephalus, extra-axial collection or mass lesion/mass effect. Vascular: No hyperdense vessel or unexpected calcification. Skull: Normal. Negative  for fracture or focal lesion. Sinuses/Orbits: No acute finding. Other: None. IMPRESSION: Normal head CT. Electronically Signed   By: Marijo Conception, M.D.   On: 08/21/2016 12:33

## 2016-08-21 NOTE — ED Notes (Signed)
Pt states sudden onset of "the worse headache of her life" today, denies any blurry vision or dizziness, states vomiting, denies any hx of migraines, states she is scheduled for a hysterectomy tomm due to polyps, awake and alert, washcloth covering eyes, neuro intact

## 2016-08-21 NOTE — Telephone Encounter (Signed)
Routing to Sally Yeakley, RN for review and advise. 

## 2016-08-21 NOTE — Telephone Encounter (Signed)
Call to patient. Questions answered regarding bowel prep and liquids for today.  Patient also wanted to confirm she has not been prescribed any oral antibiotic to take pre-operatively for MRSA.  Confirmed nasal ointment is the only pre-treatment for the MRSA.  Routing to provider for final review. Patient agreeable to disposition. Will close encounter.
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
## 2016-08-21 NOTE — Telephone Encounter (Signed)
Patient is scheduled for surgery tomorrow and has some questions.  She is to drink magnesium citrate at 10am this morning and wants to know is she to drink the whole bottle?  She also has questions about her nasal antibiotic and her post op appointment.

## 2016-08-21 NOTE — Telephone Encounter (Signed)
Patient returned call. States had MRA completed, results pending. Feeling better. Discussed need to be precautions in proceeding with surgery. Although final diagnosis could be migraine (per patient), this is a new diagnosis for her that may warrant further workup before additional physical stress of surgery.  Patient requesting to know if can proceed with surgery next week. Advised need to wait on current test results and ED recommendations. Advised Dr Quincy Simmonds will review these and make any additional recommendations. We will work her to reschedule once she is cleared for surgery.
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
## 2016-08-21 NOTE — ED Notes (Signed)
The IV was established in her left lateral AC. Dr. Joni Fears stated that it was fine to be placed there. She had concern because of her mastectomy from 12 years ago.

## 2016-08-21 NOTE — Telephone Encounter (Signed)
I recommend cancelling the surgery and rescheduling.  I want her to have safe surgery.   [place]

## 2016-08-22 ENCOUNTER — Telehealth: Payer: Self-pay | Admitting: Obstetrics and Gynecology

## 2016-08-22 ENCOUNTER — Ambulatory Visit (HOSPITAL_COMMUNITY): Admission: RE | Admit: 2016-08-22 | Payer: 59 | Source: Ambulatory Visit | Admitting: Obstetrics and Gynecology

## 2016-08-22 SURGERY — HYSTERECTOMY, TOTAL, LAPAROSCOPIC
Anesthesia: General
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
## 2016-08-22 NOTE — Telephone Encounter (Signed)
Patient is asking to talk with Gay Filler.. Patient "I spoke with her yesterday."

## 2016-08-22 NOTE — Telephone Encounter (Signed)
Return call to patient. Reports she has had another headache this afternoon.  Not nearly as intense as yesterday. Front of head. Worse when up and moving. Was not able to work today. Concerned about headaches since this is unusual for her. Interested in neurology referral.  Discussed surgery reschedule. Advised next week is not available. Patient is planning beach trip the first two weeks of July. Advised Dr Quincy Simmonds would not recommend proceeding with surgery next week and then traveling to beach a week later.  Recommend scheduling surgery after return from trip which would allow time for neuro evaluation she is requesting. Patient is requesting neuro appointment this week. Advised these appointments usually take time to be scheduled and may have several weeks wait.  Suggested she contact neuro offices in her area (lives in [place]) to see when first available appointment is and let us know. We can make referral if she can get appointment quickly.  Advised Dr Quincy Simmonds will review call for request for neuro appointment.

## 2016-08-22 NOTE — Telephone Encounter (Signed)
I am ok with scheduling for next week on Tuesday,6/26, if possible.  I will send through a staff message. Patient would need an office visit recheck prior to the rescheduled surgery.

## 2016-08-22 NOTE — Telephone Encounter (Signed)
Patient is asking to talk with Gay Filler about scheduling her surgery.

## 2016-08-23 ENCOUNTER — Encounter: Payer: Self-pay | Admitting: Neurology

## 2016-08-23 NOTE — Telephone Encounter (Signed)
I do recommend seeing neurology for new onset headaches.  I would recommend being seen in the next two weeks due to the severe nature of her headache.

## 2016-08-23 NOTE — Telephone Encounter (Signed)
First available appointment at [facility] is end of July. First available at [facility] is Monday 08-28-16 at 9:00 am. Arrive at 845. Call to patient. Per ROI can leave message on voice mail and message confirms "Regina Schultz." Left message advising of appointment on Monday with [facility] at 9:oo, arrive at 8:45. Address and phone number given. Requested call back to confirm.

## 2016-08-24 ENCOUNTER — Other Ambulatory Visit: Payer: Self-pay | Admitting: Nurse Practitioner

## 2016-08-24 DIAGNOSIS — G44201 Tension-type headache, unspecified, intractable: Secondary | ICD-10-CM

## 2016-08-24 NOTE — Telephone Encounter (Signed)
Patient returning call.

## 2016-08-24 NOTE — Telephone Encounter (Signed)
Return call to patient. Patient reports she continues to have issues with headaches and was able to get appointment yesterday with Dr Manuella Ghazi, [facility]. States BP is elevated but they think it is related to pain. She is being treated for anxiety and pain and trying to get authorization for MRI.  States checking BP at home and pain medication is helping some.  She will call back when feeling better and ready to reschedule surgery.

## 2016-08-25 ENCOUNTER — Telehealth: Payer: Self-pay

## 2016-08-25 NOTE — Telephone Encounter (Signed)
Regina Schultz from the [employer] is calling to verify is patient had surgery on 08/22/2016 for her short term disability claim. Advised surgery was cancelled and has not been rescheduled at this time. Regina Schultz verbalizes understanding.  Routing to provider for final review. Patient agreeable to disposition. Will close encounter.

## 2016-08-27 ENCOUNTER — Emergency Department
Admission: EM | Admit: 2016-08-27 | Discharge: 2016-08-27 | Disposition: A | Discharge status: Other Acute Inpatient Hospital | Payer: 59 | Attending: Emergency Medicine | Admitting: Emergency Medicine

## 2016-08-27 ENCOUNTER — Encounter: Payer: Self-pay | Admitting: Emergency Medicine

## 2016-08-27 ENCOUNTER — Emergency Department: Payer: 59

## 2016-08-27 DIAGNOSIS — R519 Headache, unspecified: Secondary | ICD-10-CM

## 2016-08-27 DIAGNOSIS — Z853 Personal history of malignant neoplasm of breast: Secondary | ICD-10-CM | POA: Diagnosis not present

## 2016-08-27 DIAGNOSIS — F17210 Nicotine dependence, cigarettes, uncomplicated: Secondary | ICD-10-CM | POA: Diagnosis not present

## 2016-08-27 DIAGNOSIS — R51 Headache: Secondary | ICD-10-CM

## 2016-08-27 DIAGNOSIS — I609 Nontraumatic subarachnoid hemorrhage, unspecified: Secondary | ICD-10-CM | POA: Insufficient documentation

## 2016-08-27 DIAGNOSIS — F909 Attention-deficit hyperactivity disorder, unspecified type: Secondary | ICD-10-CM | POA: Insufficient documentation

## 2016-08-27 DIAGNOSIS — Z79899 Other long term (current) drug therapy: Secondary | ICD-10-CM | POA: Insufficient documentation

## 2016-08-27 LAB — BASIC METABOLIC PANEL
Anion gap: 8 (ref 5–15)
BUN: 16 mg/dL (ref 6–20)
CHLORIDE: 94 mmol/L — AB (ref 101–111)
CO2: 29 mmol/L (ref 22–32)
CREATININE: 0.73 mg/dL (ref 0.44–1.00)
Calcium: 9.4 mg/dL (ref 8.9–10.3)
GFR calc non Af Amer: >60 mL/min (ref >60)
Glucose, Bld: 126 mg/dL — ABNORMAL HIGH (ref 65–99)
Potassium: 4 mmol/L (ref 3.5–5.1)
Sodium: 131 mmol/L — ABNORMAL LOW (ref 135–145)

## 2016-08-27 LAB — CBC WITH DIFFERENTIAL/PLATELET
BASOS PCT: 1 %
Basophils Absolute: 0.1 10*3/uL (ref 0–0.1)
Eosinophils Absolute: 0 10*3/uL (ref 0–0.7)
Eosinophils Relative: 0 %
HEMATOCRIT: 42.2 % (ref 35.0–47.0)
HEMOGLOBIN: 14.7 g/dL (ref 12.0–16.0)
LYMPHS ABS: 1.6 10*3/uL (ref 1.0–3.6)
Lymphocytes Relative: 14 %
MCH: 31.1 pg (ref 26.0–34.0)
MCHC: 34.9 g/dL (ref 32.0–36.0)
MCV: 89.1 fL (ref 80.0–100.0)
MONOS PCT: 4 %
Monocytes Absolute: 0.5 10*3/uL (ref 0.2–0.9)
NEUTROS ABS: 9.8 10*3/uL — AB (ref 1.4–6.5)
NEUTROS PCT: 81 %
Platelets: 329 10*3/uL (ref 150–440)
RBC: 4.74 MIL/uL (ref 3.80–5.20)
RDW: 12.4 % (ref 11.5–14.5)
WBC: 12.1 10*3/uL — ABNORMAL HIGH (ref 3.6–11.0)

## 2016-08-27 LAB — PROTIME-INR
INR: 0.99
Prothrombin Time: 13.1 seconds (ref 11.4–15.2)

## 2016-08-27 MED ORDER — KETOROLAC TROMETHAMINE 30 MG/ML IJ SOLN
15.0000 mg | Freq: Once | INTRAMUSCULAR | Status: AC
  Administered 2016-08-27: 15 mg via INTRAVENOUS
  Filled 2016-08-27: qty 1

## 2016-08-27 MED ORDER — HYDRALAZINE HCL 20 MG/ML IJ SOLN
INTRAMUSCULAR | Status: AC
  Administered 2016-08-27: 10 mg via INTRAVENOUS
  Filled 2016-08-27: qty 1

## 2016-08-27 MED ORDER — MORPHINE SULFATE (PF) 4 MG/ML IV SOLN
4.0000 mg | Freq: Once | INTRAVENOUS | Status: AC
  Administered 2016-08-27: 4 mg via INTRAVENOUS
  Filled 2016-08-27: qty 1

## 2016-08-27 MED ORDER — ONDANSETRON HCL 4 MG/2ML IJ SOLN
4.0000 mg | Freq: Once | INTRAMUSCULAR | Status: AC
  Administered 2016-08-27: 4 mg via INTRAVENOUS
  Filled 2016-08-27: qty 2

## 2016-08-27 MED ORDER — HYDRALAZINE HCL 20 MG/ML IJ SOLN
10.0000 mg | Freq: Once | INTRAMUSCULAR | Status: AC
  Administered 2016-08-27: 10 mg via INTRAVENOUS

## 2016-08-27 MED ORDER — LABETALOL HCL 5 MG/ML IV SOLN: 10.0000 mg | Freq: Once | INTRAVENOUS | Status: DC

## 2016-08-27 MED ORDER — PROCHLORPERAZINE EDISYLATE 5 MG/ML IJ SOLN
10.0000 mg | Freq: Once | INTRAMUSCULAR | Status: AC
  Administered 2016-08-27: 10 mg via INTRAVENOUS
  Filled 2016-08-27: qty 2

## 2016-08-27 MED ORDER — GADOBENATE DIMEGLUMINE 529 MG/ML IV SOLN
10.0000 mL | Freq: Once | INTRAVENOUS | Status: AC | PRN
  Administered 2016-08-27: 10 mL via INTRAVENOUS

## 2016-08-27 MED ORDER — LORAZEPAM 2 MG/ML IJ SOLN
0.5000 mg | Freq: Once | INTRAMUSCULAR | Status: AC
  Administered 2016-08-27: 0.5 mg via INTRAVENOUS
  Filled 2016-08-27: qty 1

## 2016-08-27 MED ORDER — SODIUM CHLORIDE 0.9 % IV BOLUS (SEPSIS)
1000.0000 mL | Freq: Once | INTRAVENOUS | Status: AC
  Administered 2016-08-27: 1000 mL via INTRAVENOUS

## 2016-08-27 NOTE — ED Notes (Signed)
Check on pt d/t call bell going off. Pt states she took her own tramadol and got something through the iv but needs more pain medication. Nurse made aware.

## 2016-08-27 NOTE — ED Notes (Signed)
To MRI
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
## 2016-08-27 NOTE — ED Notes (Signed)
15L oxygen via NRB placed on patient.

## 2016-08-27 NOTE — ED Triage Notes (Signed)
Pt presents ambulatory to desk with c/o headache started last night. Was seen here last Monday for same with CT completed and results were negative. Pt did follow up with  Neuro, but headache returned last night and OTC med are not working.

## 2016-08-27 NOTE — ED Notes (Signed)
Return from MRI.  AAOx3.  Skin warm and dry. NAD 

## 2016-08-27 NOTE — ED Provider Notes (Signed)
Transport service at bedside to take patient to [facility]. Patient remains medically stable for transfer. Blood pressure is at goal with systolic less than 122. Mental status intact.   Carrie Mew, MD 08/27/16 [phone]

## 2016-08-28 ENCOUNTER — Ambulatory Visit: Payer: 59 | Admitting: Neurology

## 2016-08-28 ENCOUNTER — Encounter: Payer: Self-pay | Admitting: *Deleted

## 2016-08-28 ENCOUNTER — Ambulatory Visit: Payer: 59 | Admitting: Obstetrics and Gynecology

## 2016-08-28 DIAGNOSIS — C50412 Malignant neoplasm of upper-outer quadrant of left female breast: Secondary | ICD-10-CM

## 2016-08-28 DIAGNOSIS — F32A Depression, unspecified: Secondary | ICD-10-CM | POA: Insufficient documentation

## 2016-08-28 DIAGNOSIS — R011 Cardiac murmur, unspecified: Secondary | ICD-10-CM | POA: Insufficient documentation

## 2016-08-28 DIAGNOSIS — R51 Headache: Secondary | ICD-10-CM

## 2016-08-28 DIAGNOSIS — R519 Headache, unspecified: Secondary | ICD-10-CM | POA: Insufficient documentation

## 2016-08-28 DIAGNOSIS — M8580 Other specified disorders of bone density and structure, unspecified site: Secondary | ICD-10-CM | POA: Insufficient documentation

## 2016-08-28 DIAGNOSIS — F329 Major depressive disorder, single episode, unspecified: Secondary | ICD-10-CM | POA: Insufficient documentation

## 2016-08-28 NOTE — Progress Notes (Signed)
08/28/16- Research Note for CTSU IBCSG 25-02; ADT notification received that patient was seen in ED yesterday, then transferred to [facility] for subarachnoid hemorrhage.  Notified Dr. Jana Hakim to assess if event is "possibly related" to the protocol treatment.  Dr. Jana Hakim states subarachnoid hemorrhage is Not related to the protocol treatment.  No reporting to study is required at this time.  See staff message communication below.    Not related   Thanks!   Previous Messages    ----- Message -----  From: Cathlean Cower, RN  Sent: 08/28/2016 10:41 AM  To: Benson Norway, RN, Chauncey Cruel, MD  Subject: Naida Sleight Event?                   Dr. Jana Hakim,  Mrs Duhon is in annual follow up on the TEXT (IBCSG 25-02) study. She completed the study drug Triptorelin in Nov. 2011.   She went to ED yesterday and now transferred to [facility] for a subarachnoid hemorrhage.   The study requires reporting this if the investigator assesses as possibly related to the protocol treatment.  Thank you,  Foye Spurling, BSN, RN  Clinical Research Nurse  08/28/2016 10:45 AM      Foye Spurling, BSN, RN Clinical Research Nurse 08/28/2016 4:56 PM

## 2016-08-28 NOTE — ED Notes (Signed)
Pt was taken to [facility] and family was updated on room and location. PT verbalized understanding of transport. PT stable at time of discharge transfer and report was called 5 minutes after pt left ED. PT had verbalized 4/10 pain but denies needing medication after ativan administration.

## 2016-08-29 DIAGNOSIS — I671 Cerebral aneurysm, nonruptured: Secondary | ICD-10-CM | POA: Insufficient documentation

## 2016-08-30 NOTE — Telephone Encounter (Signed)
Routing to provider for final review. Patient agreeable to disposition. Will close encounter.     

## 2016-08-30 NOTE — Telephone Encounter (Signed)
EPIC chart review from 08/27/16 with diagnosis of subarachnoid hemorrhage.   Will need to have clearance from neurology for any future surgery.

## 2016-08-31 ENCOUNTER — Ambulatory Visit: Payer: 59

## 2016-08-31 ENCOUNTER — Telehealth: Payer: Self-pay | Admitting: *Deleted

## 2016-08-31 NOTE — Telephone Encounter (Signed)
Dr Quincy Simmonds reviewed chart and noted patient seen in ED on 08-27-16 with [facility]. Call to patient for update. Left message to call back.

## 2016-09-01 NOTE — Telephone Encounter (Signed)
Call to patient. Just discharged from [facility] yesterday after [facility]. Advised patient Dr Quincy Simmonds recommends follow-up appointment be delayed until after MRI on 09-22-16.  Patient states she would like to delay surgery for a few months if possible. Advised Dr Quincy Simmonds agrees with this plan, wants her to be cleared by neurologist before proceeding so may need to consider repeat endometrial biopsy.  States she has listed Dr Quincy Simmonds as PCP and requested we complete FMLA and STD forms. Advised that with her diagnosis, she will need to be in touch with her PCP and establish on-going rleationship for long term monitoring that is outside GYN care. Will need neurologist to complete FMLA. Patient will call Dr Humphrey Rolls for appointment. Appoiontment scheduled for 8- 8-18 with Dr Quincy Simmonds.( date per patient request as she may need to move MRI)  Routing to provider for final review. Patient agreeable to disposition. Will close encounter.

## 2016-09-21 ENCOUNTER — Ambulatory Visit: Payer: 59 | Admitting: Obstetrics and Gynecology

## 2016-09-29 ENCOUNTER — Other Ambulatory Visit: Payer: Self-pay | Admitting: Obstetrics and Gynecology

## 2016-10-02 NOTE — Telephone Encounter (Signed)
1. Patient called to request a new prescription for Valtrex for an outbreak. This one needs sent to a local pharmacy: [facility], [place].  2. Patient also aware she may needs refills on the Wellbutrin sent to her mail order pharmacy but she say she did not initiate the request for it.

## 2016-10-02 NOTE — Telephone Encounter (Signed)
Medication refill request: Wellbutrin  Last AEX:  12-10-15  Next AEX: 12-20-16  Last MMG (if hormonal medication request): MRI BREAST 03-20-13 WNL  Refill authorized: please advise

## 2016-10-03 ENCOUNTER — Other Ambulatory Visit: Payer: Self-pay | Admitting: Obstetrics and Gynecology

## 2016-10-03 MED ORDER — VALACYCLOVIR HCL 500 MG PO TABS: 500.0000 mg | ORAL_TABLET | Freq: Two times a day (BID) | ORAL | 2 refills | Status: DC

## 2016-10-03 NOTE — Telephone Encounter (Signed)
Patient is asking for the status of her refill request. Patient is asking if this could be sent to her pharmacy today, she really needs this refill today if possible.

## 2016-10-04 ENCOUNTER — Ambulatory Visit: Payer: 59 | Admitting: Obstetrics and Gynecology

## 2016-10-11 ENCOUNTER — Encounter: Payer: Self-pay | Admitting: Obstetrics and Gynecology

## 2016-10-11 ENCOUNTER — Ambulatory Visit (INDEPENDENT_AMBULATORY_CARE_PROVIDER_SITE_OTHER): Payer: 59 | Admitting: Obstetrics and Gynecology

## 2016-10-11 VITALS — BP 118/72 | HR 64 | Resp 16 | Wt 126.0 lb

## 2016-10-11 DIAGNOSIS — N840 Polyp of corpus uteri: Secondary | ICD-10-CM | POA: Diagnosis not present

## 2016-10-11 NOTE — Patient Instructions (Signed)
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
## 2016-10-13 LAB — PATHOLOGY

## 2016-10-20 ENCOUNTER — Telehealth: Payer: Self-pay | Admitting: Obstetrics and Gynecology

## 2016-10-20 NOTE — Telephone Encounter (Signed)
Patient would like to speak to you about the mwessage you left about he labs.

## 2016-10-23 NOTE — Telephone Encounter (Signed)
Spoke with patient regarding EMB results. Reassured patient biopsy normal, no polyp or abnormal cells seen. Dr.Silva is not recommending surgery at this time and patient is a little concerned about this. She states it was recommended to her a few months ago and just wants to make sure. Advised patient since biopsy normal, she is not having any further bleeding and with recent history of subarachnoid hemorrhage, Dr.Silva is not recommending surgery at this time. Advised patient to call if any further bleeding. Will discuss with Dr.Silva and verify and call patient back. Routing to Dr.Silva

## 2016-10-24 NOTE — Telephone Encounter (Signed)
Phone call in follow up to normal and benign EMB result. I recommend not proceeding with hysterectomy or dilation and curettage. She is not currently bleeding.  She has a history of recurrent benign endometrial polyps, which was the indication for the hysterectomy that I cancelled.  She was recently diagnosed with a spontaneous subarachnoid hemorrhage.   She will keep her appointment for annual exam in October 2018 with me.  She will call if she experiences any further vaginal bleeding or spotting.

## 2016-10-30 ENCOUNTER — Telehealth: Payer: Self-pay | Admitting: Obstetrics and Gynecology

## 2016-10-30 NOTE — Telephone Encounter (Signed)
Patient thinks she has a UTI and having problems sleeping.  Wondering if something can be called in.

## 2016-10-30 NOTE — Telephone Encounter (Signed)
Spoke with patient. Patient states she is having UTI symptoms and is requesting a medication be sent to pharmacy on file. Also having trouble sleeping. Denies fever, chills, or lower back pain. Advised patient will need to be seen for further evaluation. Offered appointment today with Dr.Silva, but patient declines. Requesting an appointment for tomorrow. Appointment scheduled for 10/31/2016 at 3:30 pm with Dr.Silva. Advised if symptoms worsen or develops lower back pain, fever, or chills will need to be seen for earlier evaluation with our office, urgent care, or ER. Patient is agreeable.  Routing to provider for final review. Patient agreeable to disposition. Will close encounter.

## 2016-10-31 ENCOUNTER — Ambulatory Visit: Payer: 59 | Admitting: Obstetrics and Gynecology

## 2016-10-31 ENCOUNTER — Telehealth: Payer: Self-pay | Admitting: Obstetrics and Gynecology

## 2016-10-31 ENCOUNTER — Encounter: Payer: Self-pay | Admitting: Obstetrics and Gynecology

## 2016-10-31 NOTE — Telephone Encounter (Signed)
Patient left voice mail at lunch cancelling her appt for this afternoon for UTI symptoms.

## 2016-10-31 NOTE — Telephone Encounter (Signed)
Spoke with patient. Patient states that she is no longer having UTI symptoms and does not want to be seen. Will monitor and reschedule if symptoms return.  Routing to provider for final review. Patient agreeable to disposition. Will close encounter.

## 2016-11-10 DIAGNOSIS — I67841 Reversible cerebrovascular vasoconstriction syndrome: Secondary | ICD-10-CM | POA: Insufficient documentation

## 2016-11-21 ENCOUNTER — Telehealth: Payer: Self-pay

## 2016-11-21 NOTE — Telephone Encounter (Signed)
Had spoken with the patient on 10/23/16 regarding a ROI I had mailed to her regarding records from [place].  As of today I have not received the ROI back and have left her a message to touch base with me regarding if she has mailed it yet.

## 2016-12-20 ENCOUNTER — Other Ambulatory Visit (HOSPITAL_COMMUNITY)
Admission: RE | Admit: 2016-12-20 | Discharge: 2016-12-20 | Disposition: A | Discharge status: Home / Self Care | Payer: 59 | Source: Ambulatory Visit | Attending: Obstetrics and Gynecology | Admitting: Obstetrics and Gynecology

## 2016-12-20 ENCOUNTER — Ambulatory Visit (INDEPENDENT_AMBULATORY_CARE_PROVIDER_SITE_OTHER): Payer: 59 | Admitting: Obstetrics and Gynecology

## 2016-12-20 ENCOUNTER — Encounter: Payer: Self-pay | Admitting: Obstetrics and Gynecology

## 2016-12-20 VITALS — BP 104/60 | HR 68 | Resp 16 | Ht 63.0 in | Wt 130.0 lb

## 2016-12-20 DIAGNOSIS — Z01419 Encounter for gynecological examination (general) (routine) without abnormal findings: Secondary | ICD-10-CM | POA: Diagnosis not present

## 2016-12-20 DIAGNOSIS — Z1211 Encounter for screening for malignant neoplasm of colon: Secondary | ICD-10-CM | POA: Diagnosis not present
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
## 2016-12-20 NOTE — Patient Instructions (Signed)

## 2016-12-22 LAB — CYTOLOGY - PAP
Diagnosis: NEGATIVE
HPV (WINDOPATH): NOT DETECTED

## 2016-12-28 ENCOUNTER — Other Ambulatory Visit: Payer: Self-pay | Admitting: Obstetrics and Gynecology

## 2016-12-29 ENCOUNTER — Telehealth: Payer: Self-pay | Admitting: Obstetrics and Gynecology

## 2016-12-29 DIAGNOSIS — Z1211 Encounter for screening for malignant neoplasm of colon: Secondary | ICD-10-CM

## 2016-12-29 NOTE — Telephone Encounter (Signed)
Please contact the patient.  I just saw her, and I believe she was not longer taking this medication.

## 2016-12-29 NOTE — Telephone Encounter (Signed)
Medication refill request: wellbutrin  Last AEX:  12/20/16 Dr. Quincy Simmonds  Next AEX: 12/26/17  Last MMG (if hormonal medication request): none Refill authorized: 10/03/16 #[id number]. Today please advise.

## 2016-12-29 NOTE — Telephone Encounter (Signed)
°  Patient is asking for a referral to Dr.keith [facility] GI for her colonoscopy.

## 2016-12-29 NOTE — Telephone Encounter (Signed)
Spoke with patient. Called to confirm location and provider for referral to GI for colonoscopy.   Dr. Madolyn Frieze [facility]  [address] [place], [place] [address] [phone]  Advised order placed for referral. Our office referral coordinator will f/u with scheduling. Dr. Quincy Simmonds will review and I will return call with any additional recommendations. Patient verbalizes understanding and is agreeable.   Routing to provider for final review. Patient is agreeable to disposition. Will close encounter.  Cc: Magdalene Patricia

## 2016-12-29 NOTE — Telephone Encounter (Signed)
Called patient. She is no taking rx.

## 2017-01-17 ENCOUNTER — Telehealth: Payer: Self-pay | Admitting: Obstetrics and Gynecology

## 2017-01-17 MED ORDER — ALPRAZOLAM 0.25 MG PO TABS: 0.2500 mg | ORAL_TABLET | Freq: Three times a day (TID) | ORAL | 0 refills | Status: DC | PRN

## 2017-01-17 MED ORDER — BUPROPION HCL ER (XL) 300 MG PO TB24: 300.0000 mg | ORAL_TABLET | Freq: Every day | ORAL | 1 refills | Status: DC

## 2017-01-17 NOTE — Telephone Encounter (Signed)
Left message to call Demaria Deeney at [phone].  

## 2017-01-17 NOTE — Telephone Encounter (Signed)
Left message to call Mckell Riecke at [phone].  

## 2017-01-17 NOTE — Telephone Encounter (Signed)
Spoke with patient. Patient requesting RX today for anxiety and/or sleep.   Patient states she has been experiencing increased stress d/t recent loss of father. Restarted Wellbutrin 300 mg daily last week. Reports she is having trouble sleeping d/t anxiety.   Recommended f/u with PCP, patient declined, states "I never see my PCP".  Patient request to ask Dr. Quincy Simmonds if this is something she would consider prescribing?   Advised patient would review with Dr. Quincy Simmonds and return call with any additional recommendations, patient is agreeable.  Dr. Quincy Simmonds -please review and advise?

## 2017-01-17 NOTE — Telephone Encounter (Signed)
I am so sorry for her loss of her father.   Is she using an old prescription from me for the Wellbutrin or is someone new prescribing this for her? If she has a new prescriber, she will need to request additional medication from that person.   If the prescription of the Wellbutrin is an old one from me, Ok to refill Wellbutrin XL 300 mg daily for 6 months.  I would then give her an Rx for Xanax 0.25 mg po tid prn,  #30, no refills.  If she needs refills, I would have her follow up with her PCP at that time.

## 2017-01-17 NOTE — Telephone Encounter (Signed)
Spoke with patient. Patient states RX for Wellbutrin is an old one from Dr. Quincy Simmonds. Advised as seen below per Dr. Quincy Simmonds.   Patient request RX for Xanax to [facility] and Wellbutrin to [facility] -Rx sent as requested.   Patient verbalizes understanding and is agreeable.

## 2017-01-17 NOTE — Telephone Encounter (Signed)
Patient is having trouble sleeping and anxiety. Patient is asking for a prescription.

## 2017-01-17 NOTE — Telephone Encounter (Signed)
Printed RX for Xanax 0.25mg  faxed to [facility] at [phone].

## 2017-01-23 ENCOUNTER — Telehealth: Payer: Self-pay | Admitting: *Deleted

## 2017-01-23 NOTE — Telephone Encounter (Signed)
01/23/2017 Left voice mail message for patient to contact me at [phone] regarding annual study follow-up. Cindy S. Brigitte Pulse BSN, RN, [place] 01/23/2017 1:49 PM

## 2017-01-29 ENCOUNTER — Encounter: Payer: Self-pay | Admitting: *Deleted

## 2017-01-29 DIAGNOSIS — C50412 Malignant neoplasm of upper-outer quadrant of left female breast: Secondary | ICD-10-CM

## 2017-01-29 NOTE — Progress Notes (Signed)
01/29/2017 Contacted patient by phone today for annual study follow-up data collection. She reports that she is fully active (KPS = 100%), having recovered from a serious health problem this year. Other than the reported cerebrovascular hemorrhage, patient denies any other severe adverse events or significant cardiovascular events. She denies any bone fractures over the past year. It was noted that the patient was determined to be post-menopausal based on lab results from February 2018, and she denies any menstrual cycles or pregnancies (actual or attempted) since the last follow-up visit one year ago. Information regarding gynecologic procedures over the past year have been obtained from her gynecologist. Patient will continue to receive her care from her gynecologist for annual assessments. Patient denies the development of any new cancers, or diabetes or high blood sugar. Patient is aware that she will be contacted by the research nurse by phone for any additional information that is needed on an annual basis. 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 Thanked patient for her participation in this study. Patient is agreeable to signing an updated release of information form for the collection of study information in the future, if needed. Cindy S. Brigitte Pulse BSN, RN, [place] 01/29/2017 3:46 PM   Clinically significant cerebrovascular events after protocol treatment has ended  Protocol: CTSU IBCSG 25-02 TEXT Follow-up Month 156: 01/25/2016 - 01/29/2017 Event Grade Onset Date Attribution to Protocol Treatment  Subarachnoid hemorrhage 2 08/27/2016 Unrelated  Cindy S. Brigitte Pulse BSN, RN, [place] 02/06/2017 3:05 PM

## 2017-01-29 NOTE — Telephone Encounter (Signed)
01/29/2017 Spoke with patient by phone today for annual study follow-up data collection. See Research Encounter for details. Cindy S. Brigitte Pulse BSN, RN, [facility] 01/29/2017 3:44 PM

## 2017-03-07 ENCOUNTER — Telehealth: Payer: Self-pay | Admitting: Obstetrics and Gynecology

## 2017-03-07 NOTE — Telephone Encounter (Signed)
Patient notices a vaginal odor with a little discharge.  No fever, rash, or pain.  Patient aware we do not treat over the phone.

## 2017-03-07 NOTE — Telephone Encounter (Signed)
Spoke with patient. Reports vaginal odor with white/clear vaginal d/c, started end of last week. Requesting RX for BV.   Denies itching, discomfort, bleeding, urinary symptoms.   Advised OV recommended for further evaluation, patient aware Dr. Quincy Simmonds on LOA, agreeable to scheduling with covering provider. OV scheduled for 03/09/17 at 1:15pm with Dr. Sabra Heck. Patient is agreeable to date and time.   Routing to provider for final review. Patient is agreeable to disposition. Will close encounter.

## 2017-03-09 ENCOUNTER — Ambulatory Visit: Payer: Self-pay | Admitting: Obstetrics & Gynecology

## 2017-03-09 ENCOUNTER — Telehealth: Payer: Self-pay | Admitting: Obstetrics & Gynecology

## 2017-03-09 NOTE — Telephone Encounter (Signed)
Patient called and cancelled her appointment with Dr. Sabra Heck today for "vaginal odor." She said the problem has gone away and she doesn't need the appointment. Routing to the provider for FYI only.

## 2017-06-24 ENCOUNTER — Other Ambulatory Visit: Payer: Self-pay | Admitting: Obstetrics and Gynecology

## 2017-06-25 NOTE — Telephone Encounter (Signed)
Medication refill request: Wellbutrin 300mg  390 Last AEX:  12-20-16 Next AEX: 12-26-17 Last MMG (if hormonal medication request): 03-20-13 MRI of breasts, hx bil. Mastectomy--Neg/BiRads2 Refill authorized: Please advise

## 2017-08-08 ENCOUNTER — Telehealth: Payer: Self-pay | Admitting: *Deleted

## 2017-08-08 NOTE — Telephone Encounter (Signed)
This RN spoke with pt per her call - with concern " I found a new spot on my left breast "  Regina Schultz states she recently " scratched my left side - and then felt a new area on the outside of my left breast"  She states area is " where the implant meets my chest wall ", " smaller then a pea and feels kinda like gristle "  Regina Schultz is " unsure if this is something to be worried about or not "  This RN discussed known changes that can occur post mastectomy with reconstruction.  Plan per above is pt will come in to be seen by the NP this Friday for evaluation and further recommendation.

## 2017-08-10 ENCOUNTER — Encounter: Payer: Self-pay | Admitting: Adult Health

## 2017-08-10 ENCOUNTER — Inpatient Hospital Stay: Payer: Managed Care, Other (non HMO) | Attending: Adult Health | Admitting: Adult Health

## 2017-08-10 VITALS — BP 124/72 | HR 66 | Temp 98.4°F | Resp 18 | Ht 63.0 in | Wt 130.1 lb

## 2017-08-10 DIAGNOSIS — Z800 Family history of malignant neoplasm of digestive organs: Secondary | ICD-10-CM | POA: Diagnosis not present

## 2017-08-10 DIAGNOSIS — Z801 Family history of malignant neoplasm of trachea, bronchus and lung: Secondary | ICD-10-CM | POA: Diagnosis not present

## 2017-08-10 DIAGNOSIS — Z853 Personal history of malignant neoplasm of breast: Secondary | ICD-10-CM | POA: Diagnosis not present

## 2017-08-10 DIAGNOSIS — M8580 Other specified disorders of bone density and structure, unspecified site: Secondary | ICD-10-CM

## 2017-08-10 DIAGNOSIS — F988 Other specified behavioral and emotional disorders with onset usually occurring in childhood and adolescence: Secondary | ICD-10-CM | POA: Diagnosis not present

## 2017-08-10 DIAGNOSIS — Z87891 Personal history of nicotine dependence: Secondary | ICD-10-CM

## 2017-08-10 DIAGNOSIS — Z803 Family history of malignant neoplasm of breast: Secondary | ICD-10-CM

## 2017-08-10 DIAGNOSIS — F329 Major depressive disorder, single episode, unspecified: Secondary | ICD-10-CM

## 2017-08-10 DIAGNOSIS — Z9221 Personal history of antineoplastic chemotherapy: Secondary | ICD-10-CM | POA: Diagnosis not present

## 2017-08-10 DIAGNOSIS — Z9013 Acquired absence of bilateral breasts and nipples: Secondary | ICD-10-CM

## 2017-08-10 DIAGNOSIS — C50412 Malignant neoplasm of upper-outer quadrant of left female breast: Secondary | ICD-10-CM

## 2017-08-10 DIAGNOSIS — Z79899 Other long term (current) drug therapy: Secondary | ICD-10-CM

## 2017-08-10 DIAGNOSIS — Z923 Personal history of irradiation: Secondary | ICD-10-CM

## 2017-08-10 DIAGNOSIS — Z808 Family history of malignant neoplasm of other organs or systems: Secondary | ICD-10-CM
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
## 2017-08-13 ENCOUNTER — Telehealth: Payer: Self-pay | Admitting: Adult Health

## 2017-08-13 NOTE — Telephone Encounter (Signed)
Per 6/7 no los

## 2017-09-28 ENCOUNTER — Ambulatory Visit: Payer: Managed Care, Other (non HMO) | Admitting: Obstetrics and Gynecology

## 2017-09-28 ENCOUNTER — Telehealth: Payer: Self-pay | Admitting: Obstetrics and Gynecology

## 2017-09-28 ENCOUNTER — Encounter

## 2017-09-28 VITALS — BP 138/72 | HR 76 | Temp 98.6°F | Resp 14 | Ht 64.0 in | Wt 130.0 lb

## 2017-09-28 DIAGNOSIS — N760 Acute vaginitis: Secondary | ICD-10-CM

## 2017-09-28 DIAGNOSIS — B009 Herpesviral infection, unspecified: Secondary | ICD-10-CM

## 2017-09-28 DIAGNOSIS — R3129 Other microscopic hematuria: Secondary | ICD-10-CM | POA: Diagnosis not present

## 2017-09-28 LAB — POCT URINALYSIS DIPSTICK
Bilirubin, UA: NEGATIVE
GLUCOSE UA: NEGATIVE
Ketones, UA: NEGATIVE
NITRITE UA: NEGATIVE
PH UA: 5 (ref 5.0–8.0)
PROTEIN UA: NEGATIVE
Urobilinogen, UA: 0.2 E.U./dL

## 2017-09-28 MED ORDER — CLINDAMYCIN PHOSPHATE 2 % VA CREA: 1.0000 | TOPICAL_CREAM | Freq: Every day | VAGINAL | 0 refills | Status: DC

## 2017-09-28 MED ORDER — VALACYCLOVIR HCL 500 MG PO TABS: 500.0000 mg | ORAL_TABLET | Freq: Two times a day (BID) | ORAL | 2 refills | Status: DC

## 2017-09-28 NOTE — Telephone Encounter (Signed)
Patient called and scheduled an appointment at 4:00 PM today with Dr. Quincy Simmonds today for a possible UTI. She'd like to be worked into the schedule earlier in the day if possible. Added patient to our wait list as a high priority--FYI only.
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
## 2017-09-28 NOTE — Telephone Encounter (Signed)
Spoke with patient. Reports vaginal odor for a couple of wks, with a "hint of blood" when wiping yesterday. Denies urinary frequency, urgency, lower back pain, fever/chills or N/V.   Patient scheduled by front office for OV today at 4pm with Dr. Quincy Simmonds. Recommended patient keep OV as scheduled for further evaluation.  Routing to provider for final review. Patient is agreeable to disposition. Will close encounter.

## 2017-09-28 NOTE — Patient Instructions (Signed)
Bacterial Vaginosis Bacterial vaginosis is a vaginal infection that occurs when the normal balance of bacteria in the vagina is disrupted. It results from an overgrowth of certain bacteria. This is the most common vaginal infection among women ages 15-44. Because bacterial vaginosis increases your risk for STIs (sexually transmitted infections), getting treated can help reduce your risk for chlamydia, gonorrhea, herpes, and HIV (human immunodeficiency virus). Treatment is also important for preventing complications in pregnant women, because this condition can cause an early (premature) delivery. What are the causes? This condition is caused by an increase in harmful bacteria that are normally present in small amounts in the vagina. However, the reason that the condition develops is not fully understood. What increases the risk? The following factors may make you more likely to develop this condition:  Having a new sexual partner or multiple sexual partners.  Having unprotected sex.  Douching.  Having an intrauterine device (IUD).  Smoking.  Drug and alcohol abuse.  Taking certain antibiotic medicines.  Being pregnant.  You cannot get bacterial vaginosis from toilet seats, bedding, swimming pools, or contact with objects around you. What are the signs or symptoms? Symptoms of this condition include:  Grey or white vaginal discharge. The discharge can also be watery or foamy.  A fish-like odor with discharge, especially after sexual intercourse or during menstruation.  Itching in and around the vagina.  Burning or pain with urination.  Some women with bacterial vaginosis have no signs or symptoms. How is this diagnosed? This condition is diagnosed based on:  Your medical history.  A physical exam of the vagina.  Testing a sample of vaginal fluid under a microscope to look for a large amount of bad bacteria or abnormal cells. Your health care provider may use a cotton swab  or a small wooden spatula to collect the sample.  How is this treated? This condition is treated with antibiotics. These may be given as a pill, a vaginal cream, or a medicine that is put into the vagina (suppository). If the condition comes back after treatment, a second round of antibiotics may be needed. Follow these instructions at home: Medicines  Take over-the-counter and prescription medicines only as told by your health care provider.  Take or use your antibiotic as told by your health care provider. Do not stop taking or using the antibiotic even if you start to feel better. General instructions  If you have a female sexual partner, tell her that you have a vaginal infection. She should see her health care provider and be treated if she has symptoms. If you have a female sexual partner, he does not need treatment.  During treatment: ? Avoid sexual activity until you finish treatment. ? Do not douche. ? Avoid alcohol as directed by your health care provider. ? Avoid breastfeeding as directed by your health care provider.  Drink enough water and fluids to keep your urine clear or pale yellow.  Keep the area around your vagina and rectum clean. ? Wash the area daily with warm water. ? Wipe yourself from front to back after using the toilet.  Keep all follow-up visits as told by your health care provider. This is important. How is this prevented?  Do not douche.  Wash the outside of your vagina with warm water only.  Use protection when having sex. This includes latex condoms and dental dams.  Limit how many sexual partners you have. To help prevent bacterial vaginosis, it is best to have sex with just   one partner (monogamous).  Make sure you and your sexual partner are tested for STIs.  Wear cotton or cotton-lined underwear.  Avoid wearing tight pants and pantyhose, especially during summer.  Limit the amount of alcohol that you drink.  Do not use any products that  contain nicotine or tobacco, such as cigarettes and e-cigarettes. If you need help quitting, ask your health care provider.  Do not use illegal drugs. Where to find more information:  Centers for Disease Control and Prevention: www.cdc.gov/std  American Sexual Health Association (ASHA): www.ashastd.org  U.S. Department of Health and Human Services, Office on Women's Health: www.womenshealth.gov/ or https://www.womenshealth.gov/a-z-topics/bacterial-vaginosis Contact a health care provider if:  Your symptoms do not improve, even after treatment.  You have more discharge or pain when urinating.  You have a fever.  You have pain in your abdomen.  You have pain during sex.  You have vaginal bleeding between periods. Summary  Bacterial vaginosis is a vaginal infection that occurs when the normal balance of bacteria in the vagina is disrupted.  Because bacterial vaginosis increases your risk for STIs (sexually transmitted infections), getting treated can help reduce your risk for chlamydia, gonorrhea, herpes, and HIV (human immunodeficiency virus). Treatment is also important for preventing complications in pregnant women, because the condition can cause an early (premature) delivery.  This condition is treated with antibiotic medicines. These may be given as a pill, a vaginal cream, or a medicine that is put into the vagina (suppository). This information is not intended to replace advice given to you by your health care provider. Make sure you discuss any questions you have with your health care provider. Document Released: 02/20/2005 Document Revised: 06/26/2016 Document Reviewed: 11/06/2015 Elsevier Interactive Patient Education  2018 Elsevier Inc.  

## 2017-09-29 ENCOUNTER — Encounter: Payer: Self-pay | Admitting: Obstetrics and Gynecology

## 2017-09-29 LAB — URINALYSIS, MICROSCOPIC ONLY
Bacteria, UA: NONE SEEN
Casts: NONE SEEN /lpf
RBC, UA: NONE SEEN /hpf (ref 0–2)

## 2017-09-29 LAB — VAGINITIS/VAGINOSIS, DNA PROBE
Candida Species: NEGATIVE
GARDNERELLA VAGINALIS: POSITIVE — AB
Trichomonas vaginosis: NEGATIVE

## 2017-09-29 LAB — URINE CULTURE

## 2017-10-09 ENCOUNTER — Telehealth: Payer: Self-pay | Admitting: Obstetrics and Gynecology

## 2017-10-09 MED ORDER — METRONIDAZOLE 500 MG PO TABS: 500.0000 mg | ORAL_TABLET | Freq: Two times a day (BID) | ORAL | 0 refills | Status: DC

## 2017-10-09 NOTE — Telephone Encounter (Signed)
Patient is asking to talk with Dr.Silva's nurse.about getting a different prescription. Patient is asking for Flagyl.  [facility]  [address] [place], [place]

## 2017-10-09 NOTE — Telephone Encounter (Signed)
Patient returned call

## 2017-10-09 NOTE — Telephone Encounter (Signed)
Ok or Flagyl 500 mg po bid x 7 days.

## 2017-10-09 NOTE — Telephone Encounter (Signed)
Spoke with patient. Patient requesting Rx for flagyl po. Tx for BV on 09/28/17 with cleocin vaginal cream, only used x2 while on vacation. Patient states she feels she would be more consistent with pill form. Vaginal odor has not resolved. Denies vag d/c, itching or burning.   Advised will review with Dr. Quincy Simmonds and return call with recommendations. Patient aware Dr. Quincy Simmonds is our of the office, response may not be immediate. Pharmacy confirmed.   Dr. Quincy Simmonds -please advise.

## 2017-10-09 NOTE — Telephone Encounter (Signed)
Left message to call Makinley Muscato at [phone].  

## 2017-10-09 NOTE — Telephone Encounter (Signed)
Patient notified, Rx flagyl to verified pharmacy. ETOH precautions reviewed. Patient verbalizes understanding. Encounter closed.

## 2017-10-29 ENCOUNTER — Encounter: Payer: Self-pay | Admitting: Adult Health

## 2017-10-29 ENCOUNTER — Ambulatory Visit: Payer: Managed Care, Other (non HMO) | Admitting: Adult Health

## 2017-10-29 VITALS — BP 120/80 | HR 58 | Temp 98.4°F | Resp 16 | Ht 63.0 in | Wt 132.0 lb

## 2017-10-29 DIAGNOSIS — I10 Essential (primary) hypertension: Secondary | ICD-10-CM | POA: Diagnosis not present

## 2017-10-29 DIAGNOSIS — F17200 Nicotine dependence, unspecified, uncomplicated: Secondary | ICD-10-CM
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
## 2017-10-29 NOTE — Patient Instructions (Signed)

## 2017-11-12 ENCOUNTER — Other Ambulatory Visit: Payer: Self-pay | Admitting: Obstetrics and Gynecology

## 2017-11-13 NOTE — Telephone Encounter (Signed)
Spoke with patient. Patient states that she has "plenty" of wellbutrin to last until aex on 12-26-17. Advised would refuse medication from pharmacy. Patient agreeable.   Encounter closed.

## 2017-11-20 ENCOUNTER — Ambulatory Visit: Payer: Self-pay | Admitting: Adult Health

## 2017-11-22 ENCOUNTER — Ambulatory Visit: Payer: Self-pay | Admitting: Adult Health

## 2017-11-26 ENCOUNTER — Ambulatory Visit: Payer: Self-pay | Admitting: Adult Health

## 2017-11-28 ENCOUNTER — Other Ambulatory Visit: Payer: Self-pay | Admitting: Obstetrics and Gynecology

## 2017-11-29 NOTE — Telephone Encounter (Signed)
Medication refill request: wellbutrin xl 300mg  Last AEX:  12-20-16 Next AEX: 12-26-17 Last MMG (if hormonal medication request):  Refill authorized: rx denied. Per telephone call 11-13-17. Pt stated she had enough to last until aex. rx denied

## 2017-11-30 ENCOUNTER — Ambulatory Visit: Payer: Managed Care, Other (non HMO) | Admitting: Podiatry

## 2017-11-30 ENCOUNTER — Encounter: Payer: Self-pay | Admitting: Podiatry

## 2017-11-30 DIAGNOSIS — L84 Corns and callosities: Secondary | ICD-10-CM | POA: Diagnosis not present
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
## 2017-12-26 ENCOUNTER — Ambulatory Visit: Payer: Managed Care, Other (non HMO) | Admitting: Obstetrics and Gynecology

## 2017-12-26 ENCOUNTER — Other Ambulatory Visit: Payer: Self-pay

## 2017-12-26 ENCOUNTER — Other Ambulatory Visit (HOSPITAL_COMMUNITY)
Admission: RE | Admit: 2017-12-26 | Discharge: 2017-12-26 | Disposition: A | Discharge status: Home / Self Care | Payer: Managed Care, Other (non HMO) | Source: Ambulatory Visit | Attending: Obstetrics and Gynecology | Admitting: Obstetrics and Gynecology

## 2017-12-26 ENCOUNTER — Encounter: Payer: Self-pay | Admitting: Obstetrics and Gynecology

## 2017-12-26 VITALS — BP 120/74 | HR 60 | Resp 12 | Ht 63.0 in | Wt 133.4 lb

## 2017-12-26 DIAGNOSIS — A630 Anogenital (venereal) warts: Secondary | ICD-10-CM

## 2017-12-26 DIAGNOSIS — Z01419 Encounter for gynecological examination (general) (routine) without abnormal findings: Secondary | ICD-10-CM | POA: Insufficient documentation

## 2017-12-26 NOTE — Patient Instructions (Addendum)
EXERCISE AND DIET:  We recommended that you start or continue a regular exercise program for good health. Regular exercise means any activity that makes your heart beat faster and makes you sweat.  We recommend exercising at least 30 minutes per day at least 3 days a week, preferably 4 or 5.  We also recommend a diet low in fat and sugar.  Inactivity, poor dietary choices and obesity can cause diabetes, heart attack, stroke, and kidney damage, among others.    ALCOHOL AND SMOKING:  Women should limit their alcohol intake to no more than 7 drinks/beers/glasses of wine (combined, not each!) per week. Moderation of alcohol intake to this level decreases your risk of breast cancer and liver damage. And of course, no recreational drugs are part of a healthy lifestyle.  And absolutely no smoking or even second hand smoke. Most people know smoking can cause heart and lung diseases, but did you know it also contributes to weakening of your bones? Aging of your skin?  Yellowing of your teeth and nails?  CALCIUM AND VITAMIN D:  Adequate intake of calcium and Vitamin D are recommended.  The recommendations for exact amounts of these supplements seem to change often, but generally speaking 600 mg of calcium (either carbonate or citrate) and 800 units of Vitamin D per day seems prudent. Certain women may benefit from higher intake of Vitamin D.  If you are among these women, your doctor will have told you during your visit.    PAP SMEARS:  Pap smears, to check for cervical cancer or precancers,  have traditionally been done yearly, although recent scientific advances have shown that most women can have pap smears less often.  However, every woman still should have a physical exam from her gynecologist every year. It will include a breast check, inspection of the vulva and vagina to check for abnormal growths or skin changes, a visual exam of the cervix, and then an exam to evaluate the size and shape of the uterus and  ovaries.  And after 51 years of age, a rectal exam is indicated to check for rectal cancers. We will also provide age appropriate advice regarding health maintenance, like when you should have certain vaccines, screening for sexually transmitted diseases, bone density testing, colonoscopy, mammograms, etc.   MAMMOGRAMS:  All women over 40 years old should have a yearly mammogram. Many facilities now offer a "3D" mammogram, which may cost around $50 extra out of pocket. If possible,  we recommend you accept the option to have the 3D mammogram performed.  It both reduces the number of women who will be called back for extra views which then turn out to be normal, and it is better than the routine mammogram at detecting truly abnormal areas.    COLONOSCOPY:  Colonoscopy to screen for colon cancer is recommended for all women at age 50.  We know, you hate the idea of the prep.  We agree, BUT, having colon cancer and not knowing it is worse!!  Colon cancer so often starts as a polyp that can be seen and removed at colonscopy, which can quite literally save your life!  And if your first colonoscopy is normal and you have no family history of colon cancer, most women don't have to have it again for 10 years.  Once every ten years, you can do something that may end up saving your life, right?  We will be happy to help you get it scheduled when you are ready.    Be sure to check your insurance coverage so you understand how much it will cost.  It may be covered as a preventative service at no cost, but you should check your particular policy.     Genital Warts Genital warts are a common STD (sexually transmitted disease). They may appear as small bumps on the tissues of the genital area or anal area. Sometimes, they can become irritated and cause pain. Genital warts are easily passed to other people through sexual contact. Getting treatment is important because genital warts can lead to other problems. In females, the  virus that causes genital warts may increase the risk of cervical cancer. What are the causes? Genital warts are caused by a virus that is called human papillomavirus (HPV). HPV is spread by having unprotected sex with an infected person. It can be spread through vaginal, anal, and oral sex. Many people do not know that they are infected. They may be infected for years without problems. However, even if they do not have problems, they can pass the infection to their sexual partners. What increases the risk? Genital warts are more likely to develop in:  People who have unprotected sex.  People who have multiple sexual partners.  People who become sexually active before they are 51 years of age.  Men who are not circumcised.  Women who have a female sexual partner who is not circumcised.  People who have a weakened body defense system (immune system) due to disease or medicine.  People who smoke.  What are the signs or symptoms? Symptoms of genital warts include:  Small growths in the genital area or anal area. These warts often grow in clusters.  Itching and irritation in the genital area or anal area.  Bleeding from the warts.  Painful sexual intercourse.  How is this diagnosed? Genital warts can usually be diagnosed from their appearance on the vagina, vulva, penis, perineum, anus, or rectum. Tests may also be done, such as:  Biopsy. A tissue sample is removed so it can be looked at under a microscope.  Colposcopy. In females, a magnifying tool is used to examine the vagina and cervix. Certain solutions may be used to make the HPV cells change color so they can be seen more easily.  A Pap test in females.  Tests for other STDs.  How is this treated? Treatment for genital warts may include:  Applying prescription medicines to the warts. These may be solutions or creams.  Freezing the warts with liquid nitrogen (cryotherapy).  Burning the warts with: ? Laser  treatment. ? An electrified probe (electrocautery).  Injecting a substance (Candida antigen or Trichophyton antigen) into the warts to help the body's immune system to fight off the warts.  Interferon injections.  Surgery to remove the warts.  Follow these instructions at home: Medicines  Apply over-the-counter and prescription medicines only as told by your health care provider.  Do not treat genital warts with medicines that are used for treating hand warts.  Talk with your health care provider about using over-the-counter anti-itch creams. General instructions  Do not touch or scratch the warts.  Do not have sex until your treatment has been completed.  Tell your current and past sexual partners about your condition because they may also need treatment.  Keep all follow-up visits as told by your health care provider. This is important.  After treatment, use condoms during sex to prevent future infections. Other Instructions for Women  Women who have genital warts might  need increased screening for cervical cancer. This type of cancer is slow growing and can be cured if it is found early. Chances of developing cervical cancer are increased with HPV.  If you become pregnant, tell your health care provider that you have had HPV. Your health care provider will monitor you closely during pregnancy to be sure that your baby is safe. How is this prevented? Talk with your health care provider about getting the HPV vaccines. These vaccines prevent some HPV infections and cancers. It is recommended that the vaccine be given to males and females who are 63-55 years of age. It will not work if you already have HPV, and it is not recommended for pregnant women. Contact a health care provider if:  You have redness, swelling, or pain in the area of the treated skin.  You have a fever.  You feel generally ill.  You feel lumps in and around your genital area or anal area.  You have  bleeding in your genital area or anal area.  You have pain during sexual intercourse. This information is not intended to replace advice given to you by your health care provider. Make sure you discuss any questions you have with your health care provider. Document Released: 02/18/2000 Document Revised: 07/29/2015 Document Reviewed: 05/18/2014 Elsevier Interactive Patient Education  2018 [place].  Overactive Bladder, Adult Overactive bladder is a group of urinary symptoms. With overactive bladder, you may suddenly feel the need to pass urine (urinate) right away. After feeling this sudden urge, you might also leak urine if you cannot get to the bathroom fast enough (urinary incontinence). These symptoms might interfere with your daily work or social activities. Overactive bladder symptoms may also wake you up at night. Overactive bladder affects the nerve signals between your bladder and your brain. Your bladder may get the signal to empty before it is full. Very sensitive muscles can also make your bladder squeeze too soon. What are the causes? Many things can cause an overactive bladder. Possible causes include:  Urinary tract infection.  Infection of nearby tissues, such as the prostate.  Prostate enlargement.  Being pregnant with twins or more (multiples).  Surgery on the uterus or urethra.  Bladder stones, inflammation, or tumors.  Drinking too much caffeine or alcohol.  Certain medicines, especially those that you take to help your body get rid of extra fluid (diuretics) by increasing urine production.  Muscle or nerve weakness, especially from: ? A spinal cord injury. ? Stroke. ? Multiple sclerosis. ? Parkinson disease.  Diabetes. This can cause a high urine volume that fills the bladder so quickly that the normal urge to urinate is triggered very strongly.  Constipation. A buildup of too much stool can put pressure on your bladder.  What increases the risk? You  may be at greater risk for overactive bladder if you:  Are an older adult.  Smoke.  Are going through menopause.  Have prostate problems.  Have a neurological disease, such as stroke, dementia, Parkinson disease, or multiple sclerosis (MS).  Eat or drink things that irritate the bladder. These include alcohol, spicy food, and caffeine.  Are overweight or obese.  What are the signs or symptoms? The signs and symptoms of an overactive bladder include:  Sudden, strong urges to urinate.  Leaking urine.  Urinating eight or more times per day.  Waking up to urinate two or more times per night.  How is this diagnosed? Your health care provider may suspect overactive bladder based  on your symptoms. The health care provider will do a physical exam and take your medical history. Blood or urine tests may also be done. For example, you might need to have a bladder function test to check how well you can hold your urine. You might also need to see a health care provider who specializes in the urinary tract (urologist). How is this treated? Treatment for overactive bladder depends on the cause of your condition and whether it is mild or severe. Certain treatments can be done in your health care provider's office or clinic. You can also make lifestyle changes at home. Options include: Behavioral Treatments  Biofeedback. A specialist uses sensors to help you become aware of your body's signals.  Keeping a daily log of when you need to urinate and what happens after the urge. This may help you manage your condition.  Bladder training. This helps you learn to control the urge to urinate by following a schedule that directs you to urinate at regular intervals (timed voiding). At first, you might have to wait a few minutes after feeling the urge. In time, you should be able to schedule bathroom visits an hour or more apart.  Kegel exercises. These are exercises to strengthen the pelvic floor  muscles, which support the bladder. Toning these muscles can help you control urination, even if your bladder muscles are overactive. A specialist will teach you how to do these exercises correctly. They require daily practice.  Weight loss. If you are obese or overweight, losing weight might relieve your symptoms of overactive bladder. Talk to your health care provider about losing weight and whether there is a specific program or method that would work best for you.  Diet change. This might help if constipation is making your overactive bladder worse. Your health care provider or a dietitian can explain ways to change what you eat to ease constipation. You might also need to consume less alcohol and caffeine or drink other fluids at different times of the day.  Stopping smoking.  Wearing pads to absorb leakage while you wait for other treatments to take effect. Physical Treatments  Electrical stimulation. Electrodes send gentle pulses of electricity to strengthen the nerves or muscles that help to control the bladder. Sometimes, the electrodes are placed outside of the body. In other cases, they might be placed inside the body (implanted). This treatment can take several months to have an effect.  Supportive devices. Women may need a plastic device that fits into the vagina and supports the bladder (pessary). Medicines Several medicines can help treat overactive bladder and are usually used along with other treatments. Some are injected into the muscles involved in urination. Others come in pill form. Your health care provider may prescribe:  Antispasmodics. These medicines block the signals that the nerves send to the bladder. This keeps the bladder from releasing urine at the wrong time.  Tricyclic antidepressants. These types of antidepressants also relax bladder muscles.  Surgery  You may have a device implanted to help manage the nerve signals that indicate when you need to  urinate.  You may have surgery to implant electrodes for electrical stimulation.  Sometimes, very severe cases of overactive bladder require surgery to change the shape of the bladder. Follow these instructions at home:  Take medicines only as directed by your health care provider.  Use any implants or a pessary as directed by your health care provider.  Make any diet or lifestyle changes that are recommended by your  health care provider. These might include: ? Drinking less fluid or drinking at different times of the day. If you need to urinate often during the night, you may need to stop drinking fluids early in the evening. ? Cutting down on caffeine or alcohol. Both can make an overactive bladder worse. Caffeine is found in coffee, tea, and sodas. ? Doing Kegel exercises to strengthen muscles. ? Losing weight if you need to. ? Eating a healthy and balanced diet to prevent constipation.  Keep a journal or log to track how much and when you drink and also when you feel the need to urinate. This will help your health care provider to monitor your condition. Contact a health care provider if:  Your symptoms do not get better after treatment.  Your pain and discomfort are getting worse.  You have more frequent urges to urinate.  You have a fever. Get help right away if: You are not able to control your bladder at all. This information is not intended to replace advice given to you by your health care provider. Make sure you discuss any questions you have with your health care provider. Document Released: 12/17/2008 Document Revised: 07/29/2015 Document Reviewed: 07/16/2013 Elsevier Interactive Patient Education  [employer].

## 2017-12-28 LAB — LIPID PANEL
Chol/HDL Ratio: 3.2 ratio (ref 0.0–4.4)
Cholesterol, Total: 206 mg/dL — ABNORMAL HIGH (ref 100–199)
HDL: 64 mg/dL (ref >39)
LDL Calculated: 122 mg/dL — ABNORMAL HIGH (ref 0–99)
Triglycerides: 99 mg/dL (ref 0–149)
VLDL CHOLESTEROL CAL: 20 mg/dL (ref 5–40)

## 2017-12-28 LAB — CBC
HEMATOCRIT: 38.9 % (ref 34.0–46.6)
Hemoglobin: 13.6 g/dL (ref 11.1–15.9)
MCH: 31.3 pg (ref 26.6–33.0)
MCHC: 35 g/dL (ref 31.5–35.7)
MCV: 90 fL (ref 79–97)
Platelets: 330 10*3/uL (ref 150–450)
RBC: 4.34 x10E6/uL (ref 3.77–5.28)
RDW: 11.8 % — AB (ref 12.3–15.4)
WBC: 7.9 10*3/uL (ref 3.4–10.8)

## 2017-12-28 LAB — COMPREHENSIVE METABOLIC PANEL
ALBUMIN: 4.7 g/dL (ref 3.5–5.5)
ALK PHOS: 100 IU/L (ref 39–117)
ALT: 17 IU/L (ref 0–32)
AST: 18 IU/L (ref 0–40)
Albumin/Globulin Ratio: 2.1 (ref 1.2–2.2)
BILIRUBIN TOTAL: 0.3 mg/dL (ref 0.0–1.2)
BUN / CREAT RATIO: 10 (ref 9–23)
BUN: 8 mg/dL (ref 6–24)
CHLORIDE: 101 mmol/L (ref 96–106)
CO2: 25 mmol/L (ref 20–29)
Calcium: 9.5 mg/dL (ref 8.7–10.2)
Creatinine, Ser: 0.77 mg/dL (ref 0.57–1.00)
GFR calc Af Amer: 103 mL/min/{1.73_m2} (ref >59)
GFR calc non Af Amer: 90 mL/min/{1.73_m2} (ref >59)
GLOBULIN, TOTAL: 2.2 g/dL (ref 1.5–4.5)
Glucose: 97 mg/dL (ref 65–99)
POTASSIUM: 3.8 mmol/L (ref 3.5–5.2)
SODIUM: 141 mmol/L (ref 134–144)
Total Protein: 6.9 g/dL (ref 6.0–8.5)

## 2017-12-28 LAB — TSH: TSH: 1.19 u[IU]/mL (ref 0.450–4.500)

## 2017-12-28 LAB — VITAMIN D 25 HYDROXY (VIT D DEFICIENCY, FRACTURES): VIT D 25 HYDROXY: 43.9 ng/mL (ref 30.0–100.0)

## 2017-12-31 ENCOUNTER — Telehealth: Payer: Self-pay | Admitting: Obstetrics and Gynecology

## 2017-12-31 NOTE — Telephone Encounter (Signed)
Spoke with patient. Patient request to cancel vulvar biopsy scheduled for 10/29, procedure cancelled. 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 Confirmed pharmacy on file. Advised will review with Dr. Quincy Simmonds and return call. Patient agreeable.    Dr. Quincy Simmonds -please advise on Rx.

## 2017-12-31 NOTE — Telephone Encounter (Signed)
Patient is scheduled for a vulvar biopsy tomorrow, 01/01/18, at 8:30. She said she'd like to speak with the nurse about the possible option she said the doctor mentioned to her of using a cream instead of having a biopsy.

## 2017-12-31 NOTE — Telephone Encounter (Signed)
Ok to cancel vulvar biopsy.  She may treat with Aldara cream, apply to areas at hs three nights a week and wash off 8 hours later.  Disp:  3 month supply RF none.   I recommend she have a follow up appointment with me in 3 months to see the response.

## 2017-12-31 NOTE — Telephone Encounter (Signed)
Left message to call Kabria Hetzer, RN at [facility] [phone].    Rx pended.  

## 2018-01-01 ENCOUNTER — Ambulatory Visit: Payer: Managed Care, Other (non HMO) | Admitting: Obstetrics and Gynecology

## 2018-01-01 LAB — CYTOLOGY - PAP
DIAGNOSIS: NEGATIVE
HPV: NOT DETECTED

## 2018-01-02 MED ORDER — IMIQUIMOD 5 % EX CREA: TOPICAL_CREAM | CUTANEOUS | 2 refills | Status: DC

## 2018-01-02 NOTE — Telephone Encounter (Signed)
Left message to call Ben Habermann, RN at [facility] [phone].   

## 2018-01-02 NOTE — Telephone Encounter (Signed)
Spoke with patient, advised as seen below per Dr. Quincy Simmonds. Rx to verified pharmacy. OV scheduled for 04/10/18 at 9am with Dr. Quincy Simmonds. Patient verbalizes understanding and is agreeable. Encounter closed.

## 2018-01-11 ENCOUNTER — Telehealth: Payer: Self-pay | Admitting: Obstetrics and Gynecology

## 2018-01-11 NOTE — Telephone Encounter (Signed)
Left message on voicemail to call and reschedule cancelled appointment. °

## 2018-02-04 ENCOUNTER — Encounter: Payer: Self-pay | Admitting: *Deleted

## 2018-02-04 DIAGNOSIS — Z170 Estrogen receptor positive status [ER+]: Secondary | ICD-10-CM

## 2018-02-04 DIAGNOSIS — C50412 Malignant neoplasm of upper-outer quadrant of left female breast: Secondary | ICD-10-CM

## 2018-02-04 NOTE — Research (Signed)
02/04/2018 Research - [facility] IBCSG 25-02 (TEXT) Month 168 visit Contacted patient by phone today for annual study follow-up data collection. She reports that she is fully active (KPS = 100%), and has fortunately not developed any serious health problem this year. She denies any significant cardiovascular or cerebrovascular events. She denies any bone fractures over the past year. Patient's menstrual status is post-menopausal as of February 2018. Information regarding gynecologic procedures over the past year will be obtained from her gynecologic records. Patient will continue to receive her care from her gynecologist for annual assessments. Patient denies the development of any new cancers, or diabetes or high blood sugar. She has not taken any additional endocrine therapy or bisphosphonates for prevention of breast cancer recurrence. Patient is aware that she will be contacted by the research nurse by phone for any additional information that is needed on an annual basis. 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 Thanked patient for her participation in this study.  Cindy S. Brigitte Pulse BSN, RN, [place] 02/04/2018 3:13 PM
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
## 2018-04-10 ENCOUNTER — Ambulatory Visit: Payer: Managed Care, Other (non HMO) | Admitting: Obstetrics and Gynecology

## 2018-04-10 ENCOUNTER — Telehealth: Payer: Self-pay | Admitting: Obstetrics and Gynecology

## 2018-04-10 NOTE — Telephone Encounter (Signed)
Thank you for the update!
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
## 2018-04-10 NOTE — Telephone Encounter (Signed)
Patient thought her appointment this morning was for this afternoon. Rescheduled to Thursday at 1:30.

## 2018-04-11 ENCOUNTER — Telehealth: Payer: Self-pay | Admitting: Obstetrics and Gynecology

## 2018-04-11 ENCOUNTER — Ambulatory Visit: Payer: Managed Care, Other (non HMO) | Admitting: Obstetrics and Gynecology

## 2018-04-11 MED ORDER — METRONIDAZOLE 500 MG PO TABS: 500.0000 mg | ORAL_TABLET | Freq: Two times a day (BID) | ORAL | 0 refills | Status: DC

## 2018-04-11 NOTE — Telephone Encounter (Signed)
Correction, she is up to date with her annual exam.
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
## 2018-04-11 NOTE — Telephone Encounter (Addendum)
Patient cancelled/rescheduled appointment today because of inclement weather. She thinks she may have a uti and wondering if something can be called in to pharmacy until her appointment on Monday 2/10. [facility] in [place].

## 2018-04-11 NOTE — Telephone Encounter (Signed)
Spoke with patient.  Instructions given for use of Flagyl.  Verbalizes understanding.  Urgent care immediately if develops pain with urination.  Pt will call back to schedule her follow up aldera appointment, she states she hasnt been using it  Consistently but will get back to using it and return call for follow up.

## 2018-04-11 NOTE — Telephone Encounter (Signed)
Ok for Flagyl 500 mg po bid x 7 days.  I think she is due for her annual exam?

## 2018-04-15 ENCOUNTER — Ambulatory Visit: Payer: Managed Care, Other (non HMO) | Admitting: Obstetrics and Gynecology

## 2018-04-30 IMAGING — CT CT ANGIO HEAD
3 of 8 series · 15 of 47 positions shown · IV contrast (isovue)
Comparison: CT HEAD August 21, 2016 at 7477 hours

CLINICAL DATA: Acute onset severe headache at 11 a.m. History of
breast cancer.

EXAM:
CT ANGIOGRAPHY HEAD
TECHNIQUE: Multidetector CT imaging of the head was performed using the
standard protocol during bolus administration of intravenous
contrast. Multiplanar CT image reconstructions and MIPs were
obtained to evaluate the vascular anatomy.
CONTRAST:  75 cc Isovue 370

[Series 6: ax thin · axial · 0.32mm/px · z∈[-105,+14]mm · 9 of 140 slices shown]
[im 10/140  brain]
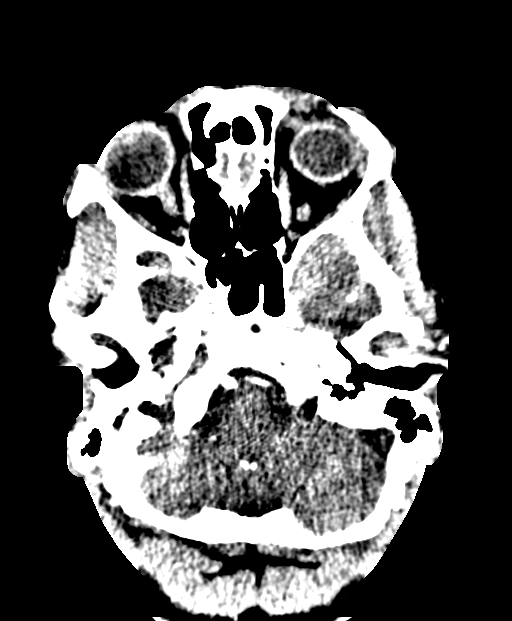
[im 28/140  bone]
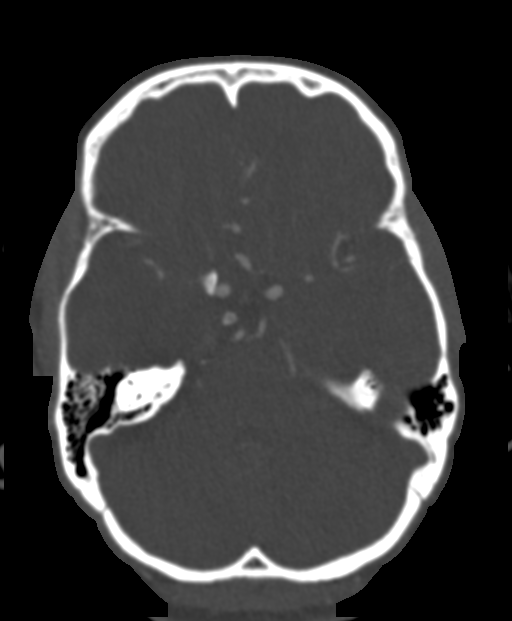
[im 38/140  brain]
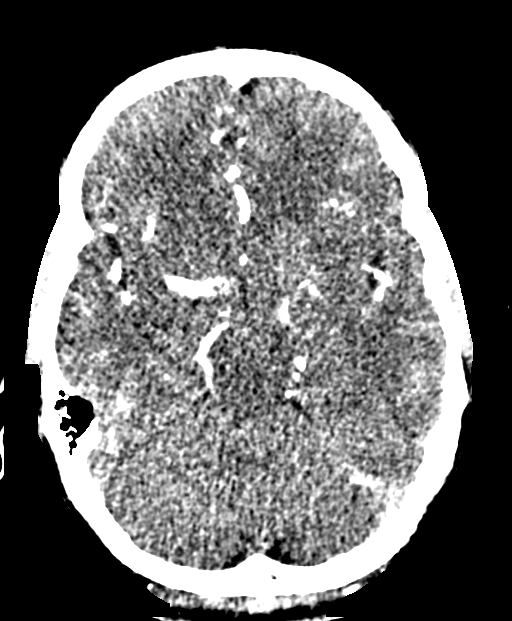
[im 56/140  bone]
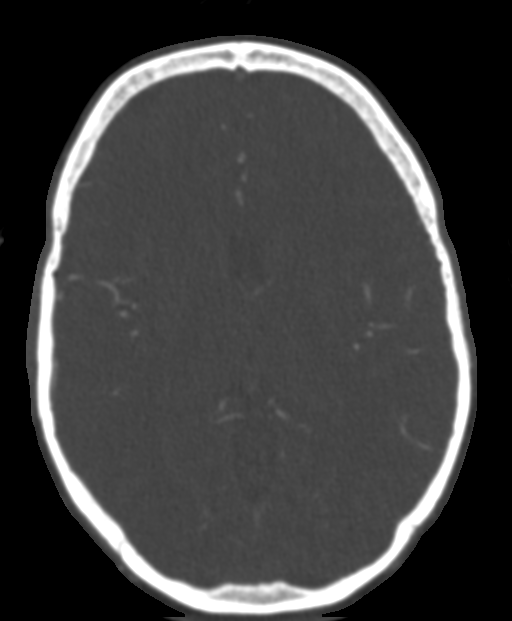
[im 75/140  brain]
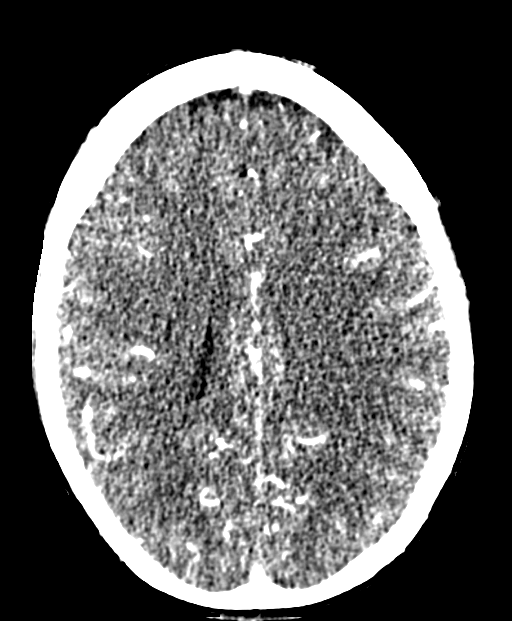
[im 84/140  bone]
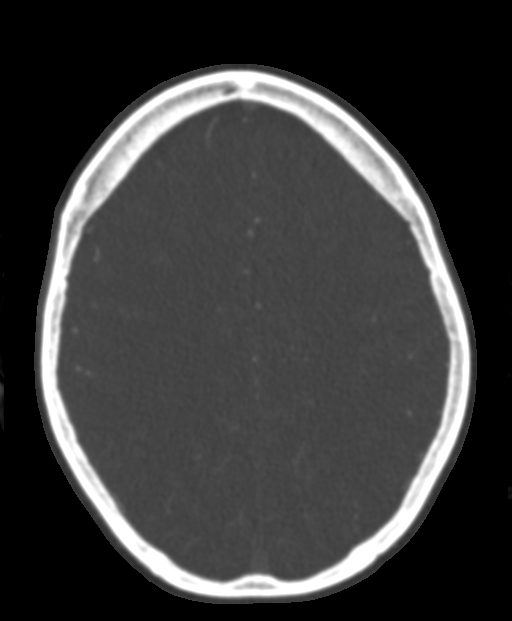
[im 102/140  brain]
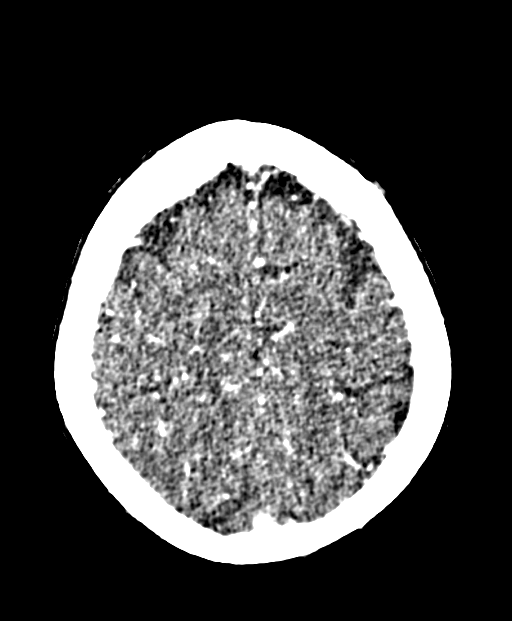
[im 112/140  bone]
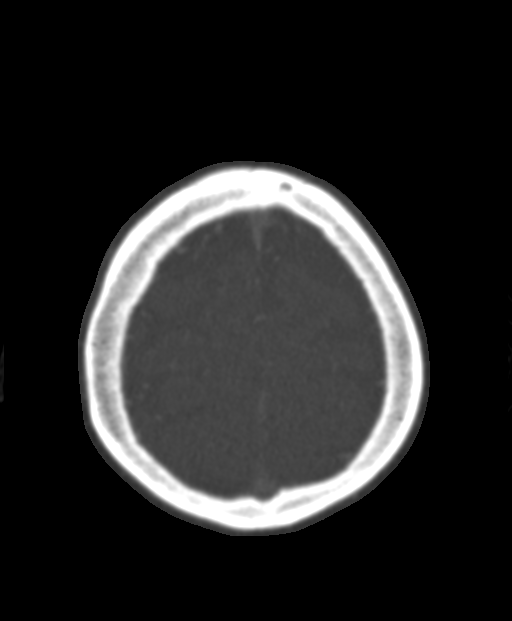
[im 130/140  brain]
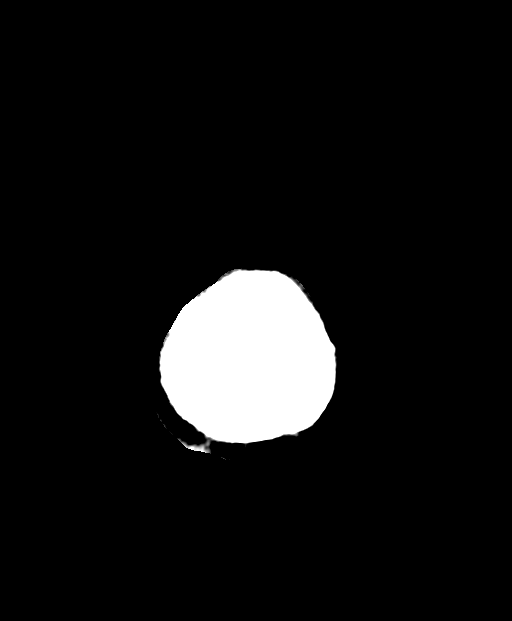

[Series 8: cor thin · coronal · 0.26mm/px · 3 of 199 slices shown]
[im 57/199  brain]
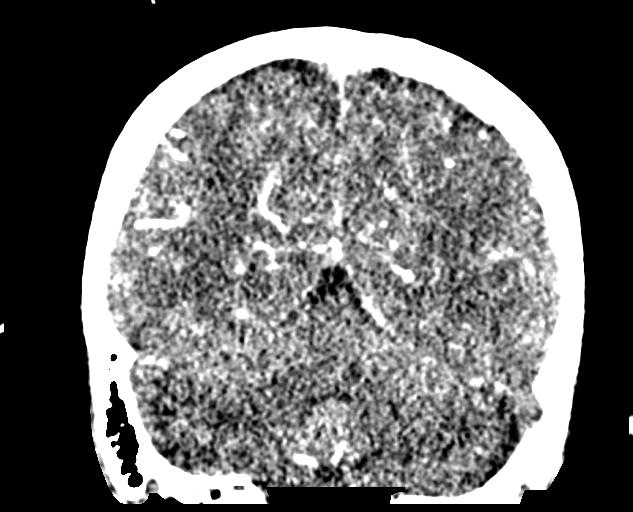
[im 85/199  brain]
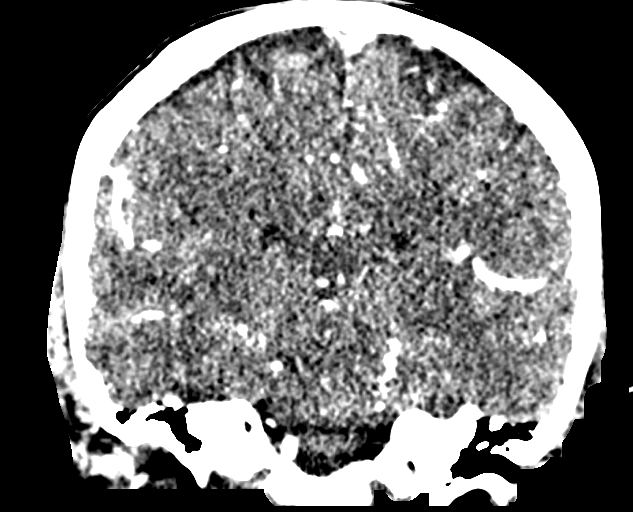
[im 114/199  brain]
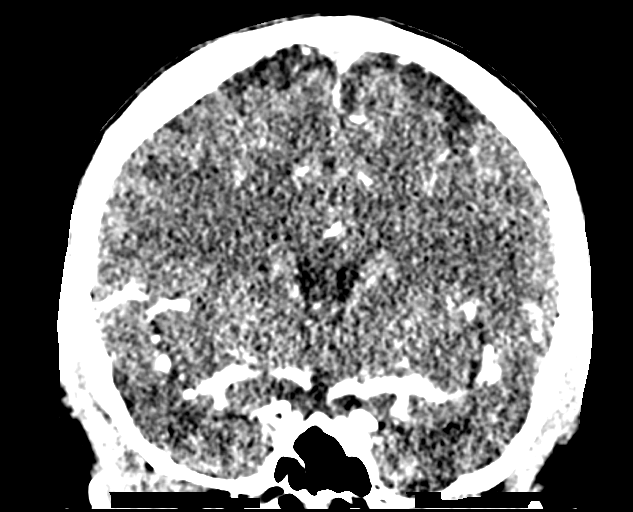

[Series 10: sag thin · sagittal · 0.29mm/px · 3 of 154 slices shown]
[im 31/154  brain]
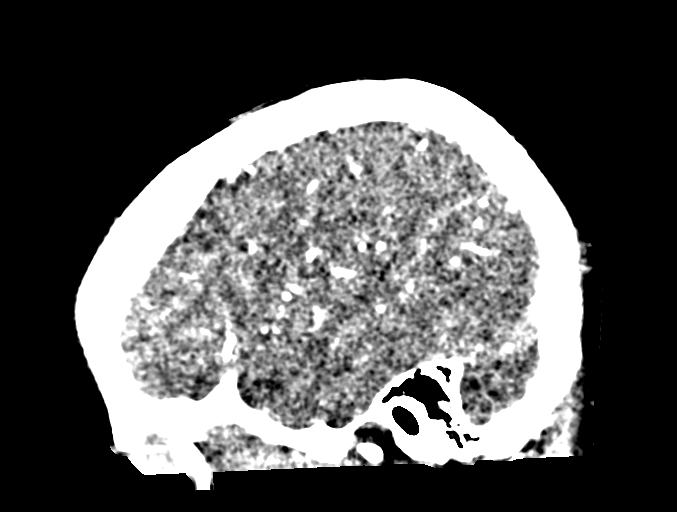
[im 62/154  brain]
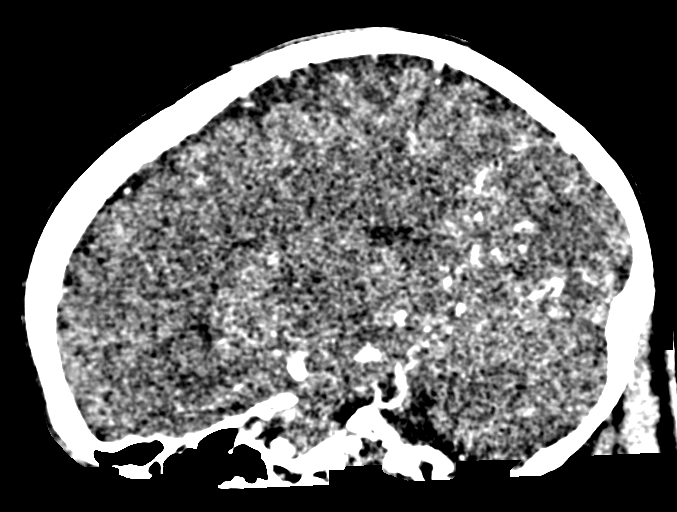
[im 92/154  brain]
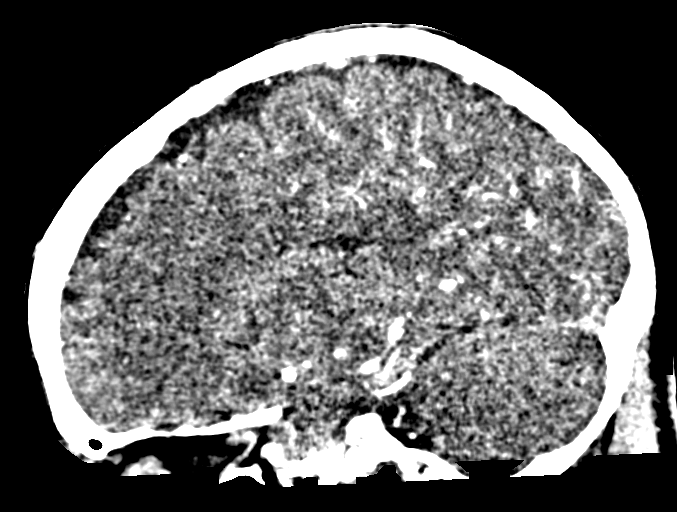

[15 of 47 positions shown; findings below may reference images not displayed]

FINDINGS: ANTERIOR CIRCULATION: Patent included cervical, petrous, cavernous
and supra clinoid internal carotid arteries. Widely patent anterior
communicating artery. Patent anterior and middle cerebral arteries.

No large vessel occlusion, hemodynamically significant stenosis,
contrast extravasation or aneurysm.

POSTERIOR CIRCULATION: LEFT vertebral artery is dominant with patent
vertebral arteries, vertebrobasilar junction and basilar artery, as
well as main branch vessels. RIGHT vertebral artery incompletely
imaged though, likely terminating in the posterior inferior
cerebellar artery. Patent posterior cerebral arteries. Fetal origin
LEFT posterior cerebral artery. Tiny RIGHT posterior communicating
artery cysts affected. In

No large vessel occlusion, hemodynamically significant stenosis,
contrast extravasation or aneurysm.

VENOUS SINUSES: Major dural venous sinuses are patent though not
tailored for evaluation on this angiographic examination.

ANATOMIC VARIANTS: None.

DELAYED PHASE: No abnormal intracranial enhancement.
IMPRESSION: Negative CTA HEAD.

## 2018-05-01 ENCOUNTER — Telehealth: Payer: Self-pay | Admitting: Obstetrics and Gynecology

## 2018-05-01 ENCOUNTER — Other Ambulatory Visit: Payer: Self-pay | Admitting: Obstetrics and Gynecology

## 2018-05-01 MED ORDER — VALACYCLOVIR HCL 500 MG PO TABS: 500.0000 mg | ORAL_TABLET | Freq: Two times a day (BID) | ORAL | 3 refills | Status: DC

## 2018-05-01 NOTE — Telephone Encounter (Signed)
I sent Rx for Valtrex 500 mg po bid x 3 days prn.  #30, RF 3.

## 2018-05-01 NOTE — Telephone Encounter (Signed)
Med refill request: Valacyclovir Last AEX: 12/26/17 Next AEX: 01/10/19 Hx of HSV  Dr. Quincy Simmonds -please advise on Rx.

## 2018-05-01 NOTE — Telephone Encounter (Signed)
Left detailed message, ok per dpr. Advised the requested RX has been sent to [facility] in [place], f/u with pharmacy for filling. Return call to office if any additional questions.  Encounter closed.

## 2018-05-01 NOTE — Telephone Encounter (Signed)
Patient is asking for a new prescription of generic Valtrex to [id number]. She needs this today if possible.

## 2018-05-06 ENCOUNTER — Telehealth: Payer: Self-pay | Admitting: Obstetrics and Gynecology

## 2018-05-06 NOTE — Telephone Encounter (Signed)
Spoke with patient. She has noticed over the last month that the hair on her lower legs is not growing. Not needing to remove hair as she previously did.  No signs of venous issues, per patient legs/feet appear normal.  Wants to know if this is typical in a post menopausal woman.  She does have a dermatolgist and says she is due for a skin check.  Advised I will send a message to Dr Quincy Simmonds for her opinion. Pt agreeable.

## 2018-05-06 NOTE — Telephone Encounter (Signed)
Patient is calling regarding loss of hair on her legs. Patient is questioning if this could be caused by menopause or a hormone imbalance.

## 2018-05-07 NOTE — Telephone Encounter (Signed)
I agree with a dermatology visit.  If she will have a delay to see this person, I will be happy to have her do a visit with me.

## 2018-05-07 NOTE — Telephone Encounter (Signed)
Call to patient. She states she feels this related to hormones. Would like office visit with Dr. Quincy Simmonds. 05/09/2018.  Encounter closed.

## 2018-05-09 ENCOUNTER — Ambulatory Visit: Payer: Self-pay | Admitting: Obstetrics and Gynecology

## 2018-05-14 ENCOUNTER — Ambulatory Visit (INDEPENDENT_AMBULATORY_CARE_PROVIDER_SITE_OTHER): Payer: Managed Care, Other (non HMO) | Admitting: Podiatry

## 2018-05-14 ENCOUNTER — Encounter: Payer: Self-pay | Admitting: Podiatry

## 2018-05-14 DIAGNOSIS — L84 Corns and callosities: Secondary | ICD-10-CM

## 2018-05-14 DIAGNOSIS — L989 Disorder of the skin and subcutaneous tissue, unspecified: Secondary | ICD-10-CM
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
## 2018-07-10 ENCOUNTER — Other Ambulatory Visit: Payer: Self-pay | Admitting: Obstetrics and Gynecology

## 2018-07-11 ENCOUNTER — Telehealth: Payer: Self-pay | Admitting: Obstetrics and Gynecology

## 2018-07-11 NOTE — Telephone Encounter (Signed)
See refill encounter from today. Prescription routed to provider. Will notify patient when a response is received from provider. Closing this encounter.

## 2018-07-11 NOTE — Telephone Encounter (Signed)
Medication refill request: Bupropion Last AEX:  12/16/17 BS Next AEX: 01/10/19 Last MMG (if hormonal medication request): 03/20/13 MRI--(Bil mastectomy &reconstruction 2006) MRI neg/BiRads2: Refill authorized: Please advise on refill; Order pended #30 w/0 refills

## 2018-07-11 NOTE — Telephone Encounter (Signed)
See refill request dated 07/10/18. Request routed to provider. Closing encounter.

## 2018-07-11 NOTE — Telephone Encounter (Signed)
Patient is asking for a refill of generic Welbutrin for 30 day while she waiting for her [facility]  mail order supply. [place], [place], [place]

## 2018-07-11 NOTE — Telephone Encounter (Signed)
Patient would like prescription sent to [facility] in [place] while waiting for her mail order from [facility].

## 2018-07-11 NOTE — Telephone Encounter (Signed)
Patient would like to know if she can receive a 30 day supply of bupropin called into pharmacy while waiting for mail order refill. [facility] pharmacy on [address].

## 2018-07-11 NOTE — Telephone Encounter (Signed)
Patient would like to know if she can receive a

## 2018-07-12 ENCOUNTER — Other Ambulatory Visit: Payer: Self-pay | Admitting: Obstetrics and Gynecology

## 2018-07-12 MED ORDER — BUPROPION HCL ER (XL) 300 MG PO TB24: 300.0000 mg | ORAL_TABLET | Freq: Every day | ORAL | 0 refills | Status: DC

## 2018-08-07 ENCOUNTER — Other Ambulatory Visit: Payer: Self-pay | Admitting: Obstetrics and Gynecology

## 2018-08-07 NOTE — Telephone Encounter (Signed)
Spoke with patient. She stated that she would like a 30 day-supply of Wellbutrin until she receives her refill from [facility]. Patient will contact mail order pharmacy to see if they have filled prescription, however she will run out at the time end of this week. Please advise. Order pended #30 w/0 refills if authorized.

## 2018-09-09 ENCOUNTER — Telehealth: Payer: Self-pay | Admitting: Obstetrics and Gynecology

## 2018-09-09 NOTE — Telephone Encounter (Signed)
Patient complaining of urinary frequency/nocturia. Denies fever or dysuria. She's concerned she is developing a UTI. She also complains of a perineal "cyst" she has felt for 2 weeks. Offered appointment today but patient couldn't come at time offered. Made appointment at 8:00am tomorrow and advised this would be a work in appointment.

## 2018-09-09 NOTE — Telephone Encounter (Signed)
Patient is having uti symptoms. 

## 2018-09-10 ENCOUNTER — Other Ambulatory Visit: Payer: Self-pay | Admitting: Obstetrics and Gynecology

## 2018-09-10 ENCOUNTER — Ambulatory Visit: Payer: Managed Care, Other (non HMO) | Admitting: Obstetrics and Gynecology

## 2018-09-10 ENCOUNTER — Telehealth: Payer: Self-pay | Admitting: Obstetrics and Gynecology

## 2018-09-10 NOTE — Telephone Encounter (Signed)
Message left to return call to Triage Nurse at [phone].    

## 2018-09-10 NOTE — Telephone Encounter (Signed)
Patient returned call. RN offered to reschedule patient appointment. OV offered for today at 1000, but patient states she would be unable to make that appointment and states she is leaving to go out of town on Thursday. Reviewed with Nursing Supervisor. Advised patient would look for cancellations an appointment tomorrow 09-11-2018. Patient agreeable. UTI precautions for Urgent Care reviewed with patient and she verbalized understanding. Advised patient would send to Dr. Quincy Simmonds for review and return call with additional recommendations. Patient agreeable.   Routing to provider for review.

## 2018-09-10 NOTE — Telephone Encounter (Signed)
Call to patient. Patient scheduled for OV on 09-11-2018 at 0830 with Dr. Quincy Simmonds. Patient agreeable to date and time of appointment.   Encounter closed.

## 2018-09-10 NOTE — Telephone Encounter (Signed)
Patient left voicemail overnight canceling her appointment for today with Dr. Quincy Simmonds. Patient was being seen for a possible UTI and perineal cyst. Patient stated that she had a family emergency and would not be able to make it.

## 2018-09-10 NOTE — Telephone Encounter (Signed)
Please offer appointment for tomorrow am.  It looks like I have an opening at 8:30.

## 2018-09-10 NOTE — Telephone Encounter (Signed)
Medication refill request: wellbutrin  Last AEX:12/26/17   Next KLK:[id number] scheduled  Last MMG (if hormonal medication request):03/20/13 MRI--(Bil mastectomy &reconstruction 2006) MRI neg/BiRads2: Refill authorized: #30 with 1 RF

## 2018-09-11 ENCOUNTER — Ambulatory Visit (INDEPENDENT_AMBULATORY_CARE_PROVIDER_SITE_OTHER): Payer: Managed Care, Other (non HMO) | Admitting: Obstetrics and Gynecology

## 2018-09-11 ENCOUNTER — Other Ambulatory Visit: Payer: Self-pay | Admitting: *Deleted

## 2018-09-11 ENCOUNTER — Encounter: Payer: Self-pay | Admitting: Obstetrics and Gynecology

## 2018-09-11 ENCOUNTER — Other Ambulatory Visit: Payer: Self-pay

## 2018-09-11 VITALS — BP 100/60 | HR 60 | Temp 97.3°F | Resp 12 | Wt 129.0 lb

## 2018-09-11 DIAGNOSIS — R350 Frequency of micturition: Secondary | ICD-10-CM | POA: Diagnosis not present

## 2018-09-11 DIAGNOSIS — L0231 Cutaneous abscess of buttock: Secondary | ICD-10-CM

## 2018-09-11 DIAGNOSIS — A630 Anogenital (venereal) warts: Secondary | ICD-10-CM

## 2018-09-11 LAB — POCT URINALYSIS DIPSTICK
Bilirubin, UA: NEGATIVE
Blood, UA: NEGATIVE
Glucose, UA: NEGATIVE
Ketones, UA: NEGATIVE
Leukocytes, UA: NEGATIVE
Nitrite, UA: NEGATIVE
Protein, UA: NEGATIVE
Urobilinogen, UA: 0.2 E.U./dL
pH, UA: 8 (ref 5.0–8.0)

## 2018-09-11 MED ORDER — BUPROPION HCL ER (XL) 300 MG PO TB24: 300.0000 mg | ORAL_TABLET | Freq: Every day | ORAL | 0 refills | Status: DC

## 2018-09-11 MED ORDER — SULFAMETHOXAZOLE-TRIMETHOPRIM 800-160 MG PO TABS: ORAL_TABLET | ORAL | 0 refills | Status: DC

## 2018-09-11 MED ORDER — FLUCONAZOLE 150 MG PO TABS: 150.0000 mg | ORAL_TABLET | Freq: Once | ORAL | 0 refills | Status: AC
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
## 2018-09-11 NOTE — Telephone Encounter (Signed)
Message to Prescriber from pharmacy:   "insurance requires 90 day supply. Please send new Rx."  Medication pended for #90, 0RF. Please refill if appropriate.   Routing to provider for review.

## 2018-09-11 NOTE — Patient Instructions (Signed)
Urinary Frequency, Adult Urinary frequency means urinating more often than usual. You may urinate every 1-2 hours even though you drink a normal amount of fluid and do not have a bladder infection or condition. Although you urinate more often than normal, the total amount of urine produced in a day is normal. With urinary frequency, you may have an urgent need to urinate often. The stress and anxiety of needing to find a bathroom quickly can make this urge worse. This condition may go away on its own or you may need treatment at home. Home treatment may include bladder training, exercises, taking medicines, or making changes to your diet. Follow these instructions at home: Bladder health   Keep a bladder diary if told by your health care provider. Keep track of: ? What you eat and drink. ? How often you urinate. ? How much you urinate.  Follow a bladder training program if told by your health care provider. This may include: ? Learning to delay going to the bathroom. ? Double urinating (voiding). This helps if you are not completely emptying your bladder. ? Scheduled voiding.  Do Kegel exercises as told by your health care provider. Kegel exercises strengthen the muscles that help control urination, which may help the condition. Eating and drinking  If told by your health care provider, make diet changes, such as: ? Avoiding caffeine. ? Drinking fewer fluids, especially alcohol. ? Not drinking in the evening. ? Avoiding foods or drinks that may irritate the bladder. These include coffee, tea, soda, artificial sweeteners, citrus, tomato-based foods, and chocolate. ? Eating foods that help prevent or ease constipation. Constipation can make this condition worse. Your health care provider may recommend that you:  Drink enough fluid to keep your urine pale yellow.  Take over-the-counter or prescription medicines.  Eat foods that are high in fiber, such as beans, whole grains, and fresh  fruits and vegetables.  Limit foods that are high in fat and processed sugars, such as fried or sweet foods. General instructions  Take over-the-counter and prescription medicines only as told by your health care provider.  Keep all follow-up visits as told by your health care provider. This is important. Contact a health care provider if:  You start urinating more often.  You feel pain or irritation when you urinate.  You notice blood in your urine.  Your urine looks cloudy.  You develop a fever.  You begin vomiting. Get help right away if:  You are unable to urinate. Summary  Urinary frequency means urinating more often than usual. With urinary frequency, you may urinate every 1-2 hours even though you drink a normal amount of fluid and do not have a bladder infection or other bladder condition.  Your health care provider may recommend that you keep a bladder diary, follow a bladder training program, or make dietary changes.  If told by your health care provider, do Kegel exercises to strengthen the muscles that help control urination.  Take over-the-counter and prescription medicines only as told by your health care provider.  Contact a health care provider if your symptoms do not improve or get worse. This information is not intended to replace advice given to you by your health care provider. Make sure you discuss any questions you have with your health care provider. Document Released: 12/17/2008 Document Revised: 08/30/2017 Document Reviewed: 08/30/2017 Elsevier Patient Education  2020 [place].  Skin Abscess  A skin abscess is an infected area on or under your skin that contains  a collection of pus and other material. An abscess may also be called a furuncle, carbuncle, or boil. An abscess can occur in or on almost any part of your body. Some abscesses break open (rupture) on their own. Most continue to get worse unless they are treated. The infection can spread  deeper into the body and eventually into your blood, which can make you feel ill. Treatment usually involves draining the abscess. What are the causes? An abscess occurs when germs, like bacteria, pass through your skin and cause an infection. This may be caused by:  A scrape or cut on your skin.  A puncture wound through your skin, including a needle injection or insect bite.  Blocked oil or sweat glands.  Blocked and infected hair follicles.  A cyst that forms beneath your skin (sebaceous cyst) and becomes infected. What increases the risk? This condition is more likely to develop in people who:  Have a weak body defense system (immune system).  Have diabetes.  Have dry and irritated skin.  Get frequent injections or use illegal IV drugs.  Have a foreign body in a wound, such as a splinter.  Have problems with their lymph system or veins. What are the signs or symptoms? Symptoms of this condition include:  A painful, firm bump under the skin.  A bump with pus at the top. This may break through the skin and drain. Other symptoms include:  Redness surrounding the abscess site.  Warmth.  Swelling of the lymph nodes (glands) near the abscess.  Tenderness.  A sore on the skin. How is this diagnosed? This condition may be diagnosed based on:  A physical exam.  Your medical history.  A sample of pus. This may be used to find out what is causing the infection.  Blood tests.  Imaging tests, such as an ultrasound, CT scan, or MRI. How is this treated? A small abscess that drains on its own may not need treatment. Treatment for larger abscesses may include:  Moist heat or heat pack applied to the area several times a day.  A procedure to drain the abscess (incision and drainage).  Antibiotic medicines. For a severe abscess, you may first get antibiotics through an IV and then change to antibiotics by mouth. Follow these instructions at home: Medicines   Take  over-the-counter and prescription medicines only as told by your health care provider.  If you were prescribed an antibiotic medicine, take it as told by your health care provider. Do not stop taking the antibiotic even if you start to feel better. Abscess care   If you have an abscess that has not drained, apply heat to the affected area. Use the heat source that your health care provider recommends, such as a moist heat pack or a heating pad. ? Place a towel between your skin and the heat source. ? Leave the heat on for 20-30 minutes. ? Remove the heat if your skin turns bright red. This is especially important if you are unable to feel pain, heat, or cold. You may have a greater risk of getting burned.  Follow instructions from your health care provider about how to take care of your abscess. Make sure you: ? Cover the abscess with a bandage (dressing). ? Change your dressing or gauze as told by your health care provider. ? Wash your hands with soap and water before you change the dressing or gauze. If soap and water are not available, use hand sanitizer.  Check your  abscess every day for signs of a worsening infection. Check for: ? More redness, swelling, or pain. ? More fluid or blood. ? Warmth. ? More pus or a bad smell. General instructions  To avoid spreading the infection: ? Do not share personal care items, towels, or hot tubs with others. ? Avoid making skin contact with other people.  Keep all follow-up visits as told by your health care provider. This is important. Contact a health care provider if you have:  More redness, swelling, or pain around your abscess.  More fluid or blood coming from your abscess.  Warm skin around your abscess.  More pus or a bad smell coming from your abscess.  A fever.  Muscle aches.  Chills or a general ill feeling. Get help right away if you:  Have severe pain.  See red streaks on your skin spreading away from the  abscess. Summary  A skin abscess is an infected area on or under your skin that contains a collection of pus and other material.  A small abscess that drains on its own may not need treatment.  Treatment for larger abscesses may include having a procedure to drain the abscess and taking an antibiotic. This information is not intended to replace advice given to you by your health care provider. Make sure you discuss any questions you have with your health care provider. Document Released: 11/30/2004 Document Revised: 06/13/2018 Document Reviewed: 04/05/2017 Elsevier Patient Education  2020 [place].  Genital Warts Genital warts are a common STD (sexually transmitted disease). They may appear as small bumps on the skin of the genital and anal areas. They sometimes become irritated and cause pain. Genital warts are easily passed to other people through sexual contact. Many people do not know that they are infected. They may be infected for years without symptoms. However, even if they do not have symptoms, they can pass the infection to their sexual partners. Getting treatment is important because genital warts can lead to other problems. In females, the virus that causes genital warts may increase the risk for cervical cancer. What are the causes? This condition is caused by a virus that is called human papillomavirus (HPV). HPV is spread by having unprotected sex with an infected person. It can be spread through vaginal, anal, and oral sex. What increases the risk? You are more likely to develop this condition if:  You have unprotected sex.  You have multiple sexual partners.  You are sexually active before age 69.  You are a man who isnot circumcised.  You have a female sexual partner who is not circumcised.  You have a weakened body defense system (immune system) due to disease or medicine.  You smoke. What are the signs or symptoms? Symptoms of this condition include:  Small  growths in the genital area or anal area. These warts often grow in clusters.  Itching and irritation in the genital area or anal area.  Bleeding from the warts.  Pain during sex. How is this diagnosed? This condition is diagnosed based on:  Your symptoms.  A physical exam. You may also have other tests, including:  Biopsy. A tissue sample is removed so it can be checked under a microscope.  Colposcopy. In females, a magnifying tool is used to examine the vagina and cervix. Certain solutions may be used to make the HPV cells change color so they can be seen more easily.  A Pap test in females.  Tests for other STDs. How is  this treated? This condition may be treated with:  Medicines, such as: ? Solutions or creams applied to your skin (topical).  Procedures, such as: ? Freezing the warts with liquid nitrogen (cryotherapy). ? Burning the warts with a laser or electric probe (electrocautery). ? Surgery to remove the warts. Follow these instructions at home: Medicines   Apply over-the-counter and prescription medicines only as told by your health care provider.  Do not treat genital warts with medicines that are used for treating hand warts.  Talk with your health care provider about using over-the-counter anti-itch creams. Instructions for women  Get screened regularly for cervical cancer. Women who have genital warts are at an increased risk for this cancer. This type of cancer is slow growing and can be cured if it is found early.  If you become pregnant, tell your health care provider that you have had an HPV infection. Your health care provider will monitor you closely during pregnancy to be sure that your baby is safe. General instructions  Do not touch or scratch the warts.  Do not have sex until your treatment has been completed.  Tell your current and past sexual partners about your condition because they may also need treatment.  After treatment, use  condoms during sex to prevent future infections.  Keep all follow-up visits as told by your health care provider. This is important. How is this prevented? Talk with your health care provider about getting the HPV vaccine. The vaccine:  Can prevent some HPV infections and cancers.  Is recommended for males and females who are 74-65 years old.  Is not recommended for pregnant women.  Will not work if you already have HPV. Contact a health care provider if you:  Have redness, swelling, or pain in the area of the treated skin.  Have a fever.  Feel generally ill.  Feel lumps in and around your genital or anal area.  Have bleeding in your genital or anal area.  Have pain during sex. Summary  Genital warts are a common STD (sexually transmitted disease). It may appear as small bumps on the genital and anal areas.  This condition is caused by a virus that is called human papillomavirus (HPV). HPV is spread by having unprotected sex with an infected person. It can be spread through vaginal, anal, and oral sex.  Treatment is important because genital warts can lead to other problems. In females, the virus that causes genital warts may increase the risk for cervical cancer.  This condition may be treated with medicine that is applied to the skin, or procedures to remove the warts.  The HPV vaccine can prevent some HPV infections and cancers. It is recommended that the vaccine be given to males and females who are 32-48 years old. This information is not intended to replace advice given to you by your health care provider. Make sure you discuss any questions you have with your health care provider. Document Released: 02/18/2000 Document Revised: 03/27/2017 Document Reviewed: 03/27/2017 Elsevier Patient Education  2020 [employer].

## 2018-09-12 ENCOUNTER — Ambulatory Visit: Payer: Managed Care, Other (non HMO) | Admitting: Obstetrics and Gynecology

## 2018-09-16 ENCOUNTER — Telehealth: Payer: Self-pay | Admitting: Obstetrics and Gynecology

## 2018-09-16 NOTE — Telephone Encounter (Signed)
Call placed to patient to review benefits and to scheduled recommended procedure. Left voicemail requesting a return call

## 2018-09-26 ENCOUNTER — Encounter: Payer: Self-pay | Admitting: Obstetrics and Gynecology

## 2018-09-26 ENCOUNTER — Other Ambulatory Visit: Payer: Self-pay

## 2018-09-26 ENCOUNTER — Telehealth: Payer: Self-pay | Admitting: Obstetrics and Gynecology

## 2018-09-26 ENCOUNTER — Ambulatory Visit: Payer: Managed Care, Other (non HMO) | Admitting: Obstetrics and Gynecology

## 2018-09-26 VITALS — BP 110/64 | HR 80 | Temp 97.5°F | Resp 12 | Ht 63.0 in | Wt 130.0 lb

## 2018-09-26 DIAGNOSIS — L0231 Cutaneous abscess of buttock: Secondary | ICD-10-CM | POA: Diagnosis not present

## 2018-09-26 MED ORDER — CEPHALEXIN 500 MG PO CAPS: 500.0000 mg | ORAL_CAPSULE | Freq: Two times a day (BID) | ORAL | 0 refills | Status: DC

## 2018-09-26 NOTE — Patient Instructions (Signed)
Skin Abscess  A skin abscess is an infected area on or under your skin that contains a collection of pus and other material. An abscess may also be called a furuncle, carbuncle, or boil. An abscess can occur in or on almost any part of your body. Some abscesses break open (rupture) on their own. Most continue to get worse unless they are treated. The infection can spread deeper into the body and eventually into your blood, which can make you feel ill. Treatment usually involves draining the abscess. What are the causes? An abscess occurs when germs, like bacteria, pass through your skin and cause an infection. This may be caused by:  A scrape or cut on your skin.  A puncture wound through your skin, including a needle injection or insect bite.  Blocked oil or sweat glands.  Blocked and infected hair follicles.  A cyst that forms beneath your skin (sebaceous cyst) and becomes infected. What increases the risk? This condition is more likely to develop in people who:  Have a weak body defense system (immune system).  Have diabetes.  Have dry and irritated skin.  Get frequent injections or use illegal IV drugs.  Have a foreign body in a wound, such as a splinter.  Have problems with their lymph system or veins. What are the signs or symptoms? Symptoms of this condition include:  A painful, firm bump under the skin.  A bump with pus at the top. This may break through the skin and drain. Other symptoms include:  Redness surrounding the abscess site.  Warmth.  Swelling of the lymph nodes (glands) near the abscess.  Tenderness.  A sore on the skin. How is this diagnosed? This condition may be diagnosed based on:  A physical exam.  Your medical history.  A sample of pus. This may be used to find out what is causing the infection.  Blood tests.  Imaging tests, such as an ultrasound, CT scan, or MRI. How is this treated? A small abscess that drains on its own may not  need treatment. Treatment for larger abscesses may include:  Moist heat or heat pack applied to the area several times a day.  A procedure to drain the abscess (incision and drainage).  Antibiotic medicines. For a severe abscess, you may first get antibiotics through an IV and then change to antibiotics by mouth. Follow these instructions at home: Medicines   Take over-the-counter and prescription medicines only as told by your health care provider.  If you were prescribed an antibiotic medicine, take it as told by your health care provider. Do not stop taking the antibiotic even if you start to feel better. Abscess care   If you have an abscess that has not drained, apply heat to the affected area. Use the heat source that your health care provider recommends, such as a moist heat pack or a heating pad. ? Place a towel between your skin and the heat source. ? Leave the heat on for 20-30 minutes. ? Remove the heat if your skin turns bright red. This is especially important if you are unable to feel pain, heat, or cold. You may have a greater risk of getting burned.  Follow instructions from your health care provider about how to take care of your abscess. Make sure you: ? Cover the abscess with a bandage (dressing). ? Change your dressing or gauze as told by your health care provider. ? Wash your hands with soap and water before you change the   dressing or gauze. If soap and water are not available, use hand sanitizer.  Check your abscess every day for signs of a worsening infection. Check for: ? More redness, swelling, or pain. ? More fluid or blood. ? Warmth. ? More pus or a bad smell. General instructions  To avoid spreading the infection: ? Do not share personal care items, towels, or hot tubs with others. ? Avoid making skin contact with other people.  Keep all follow-up visits as told by your health care provider. This is important. Contact a health care provider if you  have:  More redness, swelling, or pain around your abscess.  More fluid or blood coming from your abscess.  Warm skin around your abscess.  More pus or a bad smell coming from your abscess.  A fever.  Muscle aches.  Chills or a general ill feeling. Get help right away if you:  Have severe pain.  See red streaks on your skin spreading away from the abscess. Summary  A skin abscess is an infected area on or under your skin that contains a collection of pus and other material.  A small abscess that drains on its own may not need treatment.  Treatment for larger abscesses may include having a procedure to drain the abscess and taking an antibiotic. This information is not intended to replace advice given to you by your health care provider. Make sure you discuss any questions you have with your health care provider. Document Released: 11/30/2004 Document Revised: 06/13/2018 Document Reviewed: 04/05/2017 Elsevier Patient Education  2020 [employer]  
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
## 2018-09-26 NOTE — Telephone Encounter (Signed)
Spoke with patient. Patient was seen in office and treated for left buttocks abscess on 09/11/18. Completed abx, symptoms improved, never completely resolved. Abscess is not larger that last time, but has filled back up with fluids. Denies fever/chills. Patient is requesting another abx. Advised patient she will need to been seen in office for further evaluation. OV scheduled for today at 3:30pm with Dr. Quincy Simmonds. [id number] precautions reviewed, prescreen neg.   Routing to provider for final review. Patient is agreeable to disposition. Will close encounter.

## 2018-09-26 NOTE — Telephone Encounter (Signed)
Patient is calling with follow up questions regarding cyst. Patient stated that antibiotics helped. Patient completed round of antibiotics and the infection seems to be coming back.

## 2018-09-29 LAB — WOUND CULTURE: Organism ID, Bacteria: NONE SEEN

## 2018-10-16 ENCOUNTER — Telehealth: Payer: Self-pay | Admitting: Obstetrics and Gynecology

## 2018-10-16 NOTE — Telephone Encounter (Signed)
On 10/15/2018, patient returned my call to scheduled vulva biopsy left voicemail on my direct deck number. I returned call to patient and left a voicemail message requesting a return call

## 2018-10-17 ENCOUNTER — Ambulatory Visit: Payer: Managed Care, Other (non HMO) | Admitting: Adult Health

## 2018-10-17 NOTE — Telephone Encounter (Signed)
PAtient

## 2018-10-17 NOTE — Telephone Encounter (Signed)
Patient returned call. Reviewed benefit for recommended procedure. Patient acknowledges understanding. Patient is ready to proceed with Scheduling. Patient is scheduled with Dr Quincy Simmonds on 10/24/2018. No further questions. Will close encounter

## 2018-10-22 ENCOUNTER — Other Ambulatory Visit: Payer: Self-pay

## 2018-10-22 ENCOUNTER — Ambulatory Visit: Payer: Managed Care, Other (non HMO) | Admitting: Internal Medicine

## 2018-10-22 VITALS — BP 125/82 | HR 65 | Resp 16 | Ht 64.0 in | Wt 128.0 lb

## 2018-10-22 DIAGNOSIS — Z0131 Encounter for examination of blood pressure with abnormal findings: Secondary | ICD-10-CM

## 2018-10-22 DIAGNOSIS — F411 Generalized anxiety disorder: Secondary | ICD-10-CM

## 2018-10-22 DIAGNOSIS — I499 Cardiac arrhythmia, unspecified: Secondary | ICD-10-CM | POA: Diagnosis not present

## 2018-10-22 DIAGNOSIS — Z8679 Personal history of other diseases of the circulatory system: Secondary | ICD-10-CM

## 2018-10-22 DIAGNOSIS — G441 Vascular headache, not elsewhere classified: Secondary | ICD-10-CM

## 2018-10-22 MED ORDER — AMLODIPINE BESYLATE 2.5 MG PO TABS: ORAL_TABLET | ORAL | 3 refills | Status: DC

## 2018-10-22 MED ORDER — ESCITALOPRAM OXALATE 10 MG PO TABS: 10.0000 mg | ORAL_TABLET | Freq: Every day | ORAL | 3 refills | Status: DC
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
## 2018-10-23 ENCOUNTER — Encounter: Payer: Self-pay | Admitting: Internal Medicine

## 2018-10-24 ENCOUNTER — Other Ambulatory Visit: Payer: Self-pay

## 2018-10-24 ENCOUNTER — Ambulatory Visit: Payer: Managed Care, Other (non HMO) | Admitting: Obstetrics and Gynecology

## 2018-10-24 ENCOUNTER — Encounter: Payer: Self-pay | Admitting: Obstetrics and Gynecology

## 2018-10-24 DIAGNOSIS — A630 Anogenital (venereal) warts: Secondary | ICD-10-CM | POA: Diagnosis not present
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
## 2018-10-24 NOTE — Patient Instructions (Signed)
Vulva Biopsy, Care After This sheet gives you information about how to care for yourself after your procedure. Your health care provider may also give you more specific instructions. If you have problems or questions, contact your health care provider. What can I expect after the procedure? After the procedure, it is common to have:  Slight bleeding from the biopsy site.  Discomfort at the biopsy site. Follow these instructions at home: Biopsy site care   Follow instructions from your health care provider about how to take care of your biopsy site. Make sure you: ? Clean the area using water and mild soap twice a day or as told by your health care provider. Gently pat the area dry. ? If you were prescribed an antibiotic ointment, apply it as told by your health care provider. Do not stop using the antibiotic even if your condition improves. ? Take a warm water bath (sitz bath) as needed to help with pain and discomfort. A sitz bath is taken while you are sitting down. The water should only come up to your hips and should cover your buttocks. ? Leave stitches (sutures), skin glue, or adhesive strips in place. These skin closures may need to stay in place for 2 weeks or longer. If adhesive strip edges start to loosen and curl up, you may trim the loose edges. Do not remove adhesive strips completely unless your health care provider tells you to do that.  Check your biopsy site every day for signs of infection. Check for: ? More redness, swelling, or pain. ? More fluid or blood. ? Warmth. ? Pus or a bad smell.  Do not rub the biopsy area after urinating. Gently pat the area dry or use a bottle filled with warm water (peri-bottle) to clean the area. Gently wipe from front to back. Lifestyle  Wear loose, cotton underwear. Do not wear tight pants.  Do not use a tampon, douche, or put anything inside your vagina for at least 1 week or until your health care provider approves.  Do not have sex  for at least 1 week or until your health care provider approves.  Do not exercise, such as running or biking, until your health care provider approves.  Do not swim or use a hot tub until your health care provider approves. You may shower or take a sitz bath. General instructions  Take over-the-counter and prescription medicines only as told by your health care provider.  Use a sanitary napkin until the bleeding stops.  Keep all follow-up visits as told by your health care provider. This is important. Contact a health care provider if:  You have more redness, swelling, or pain around your biopsy site.  You have more fluid or blood coming from your biopsy site.  Your biopsy site feels warm to the touch.  Your pain is not controlled with medicine. Get help right away if you have:  Heavy bleeding from the vulva.  Pus or a bad smell coming from your biopsy site.  A fever.  Lower abdominal pain. Summary  After the procedure, it is common to have slight bleeding and discomfort at the biopsy site.  Follow instructions from your health care provider after your biopsy. Make sure you clean the area with water and mild soap. Pat the area dry.  Take sitz baths as needed to help with pain and discomfort. Leave any sutures in place.  Check your biopsy site for signs of infection, which may include more redness, swelling, pain, fluid,  or blood, or feeling warm to the touch.  Get help right away if you have heavy bleeding, a fever, pus or a bad smell, or pain in the lower abdomen. This information is not intended to replace advice given to you by your health care provider. Make sure you discuss any questions you have with your health care provider. Document Released: 02/07/2012 Document Revised: 08/23/2017 Document Reviewed: 08/23/2017 Elsevier Patient Education  2020 [employer].

## 2018-11-08 ENCOUNTER — Ambulatory Visit: Payer: Managed Care, Other (non HMO)

## 2018-11-15 ENCOUNTER — Ambulatory Visit: Payer: Managed Care, Other (non HMO)

## 2018-11-15 ENCOUNTER — Other Ambulatory Visit: Payer: Self-pay

## 2018-11-15 DIAGNOSIS — I499 Cardiac arrhythmia, unspecified: Secondary | ICD-10-CM | POA: Diagnosis not present

## 2018-11-18 ENCOUNTER — Other Ambulatory Visit: Payer: Self-pay

## 2018-11-18 ENCOUNTER — Encounter: Payer: Self-pay | Admitting: Internal Medicine

## 2018-11-18 ENCOUNTER — Other Ambulatory Visit: Payer: Self-pay | Admitting: Internal Medicine

## 2018-11-18 ENCOUNTER — Ambulatory Visit: Payer: Managed Care, Other (non HMO) | Admitting: Internal Medicine

## 2018-11-18 DIAGNOSIS — I499 Cardiac arrhythmia, unspecified: Secondary | ICD-10-CM

## 2018-11-18 DIAGNOSIS — I10 Essential (primary) hypertension: Secondary | ICD-10-CM

## 2018-11-18 DIAGNOSIS — Z8679 Personal history of other diseases of the circulatory system: Secondary | ICD-10-CM

## 2018-11-18 DIAGNOSIS — F411 Generalized anxiety disorder: Secondary | ICD-10-CM

## 2018-11-18 MED ORDER — ESCITALOPRAM OXALATE 10 MG PO TABS: ORAL_TABLET | ORAL | 3 refills | Status: DC

## 2018-11-18 MED ORDER — BUPROPION HCL ER (XL) 300 MG PO TB24: ORAL_TABLET | ORAL | 0 refills | Status: DC
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
## 2018-11-19 ENCOUNTER — Ambulatory Visit
Admission: RE | Admit: 2018-11-19 | Discharge: 2018-11-19 | Disposition: A | Discharge status: Home / Self Care | Payer: Managed Care, Other (non HMO) | Source: Ambulatory Visit | Attending: Internal Medicine | Admitting: Internal Medicine

## 2018-11-19 DIAGNOSIS — Z8679 Personal history of other diseases of the circulatory system: Secondary | ICD-10-CM | POA: Insufficient documentation

## 2018-11-19 DIAGNOSIS — G441 Vascular headache, not elsewhere classified: Secondary | ICD-10-CM | POA: Diagnosis not present

## 2018-11-19 LAB — POCT I-STAT CREATININE: Creatinine, Ser: 0.7 mg/dL (ref 0.44–1.00)

## 2018-11-19 MED ORDER — GADOBUTROL 1 MMOL/ML IV SOLN
5.0000 mL | Freq: Once | INTRAVENOUS | Status: AC | PRN
  Administered 2018-11-19: 5 mL via INTRAVENOUS

## 2018-11-20 ENCOUNTER — Telehealth: Payer: Self-pay

## 2018-11-20 NOTE — Telephone Encounter (Signed)
-----   Message from Lavera Guise, MD sent at 11/20/2018 10:35 AM EDT ----- MRI is stable, no acute changes

## 2018-11-20 NOTE — Telephone Encounter (Signed)
lmom to call us back

## 2018-11-20 NOTE — Telephone Encounter (Signed)
Pt advised MRI stable no acute changes

## 2018-12-27 ENCOUNTER — Other Ambulatory Visit: Payer: Self-pay

## 2018-12-30 ENCOUNTER — Ambulatory Visit: Payer: Managed Care, Other (non HMO) | Admitting: Obstetrics and Gynecology
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
## 2018-12-31 ENCOUNTER — Other Ambulatory Visit: Payer: Self-pay

## 2018-12-31 ENCOUNTER — Ambulatory Visit: Payer: Managed Care, Other (non HMO) | Admitting: Obstetrics and Gynecology

## 2018-12-31 ENCOUNTER — Encounter: Payer: Self-pay | Admitting: Obstetrics and Gynecology

## 2018-12-31 VITALS — BP 122/80 | HR 66 | Temp 97.8°F | Resp 16 | Ht 63.0 in | Wt 133.2 lb

## 2018-12-31 DIAGNOSIS — Z01419 Encounter for gynecological examination (general) (routine) without abnormal findings: Secondary | ICD-10-CM

## 2018-12-31 DIAGNOSIS — A630 Anogenital (venereal) warts: Secondary | ICD-10-CM

## 2018-12-31 MED ORDER — VALACYCLOVIR HCL 500 MG PO TABS: 500.0000 mg | ORAL_TABLET | Freq: Two times a day (BID) | ORAL | 3 refills | Status: DC

## 2018-12-31 NOTE — Addendum Note (Signed)
Addended by: Yisroel Ramming, Dietrich Pates E on: 12/31/2018 01:48 PM   Modules accepted: Orders

## 2018-12-31 NOTE — Addendum Note (Signed)
Addended by: Yisroel Ramming, BROOK E on: 12/31/2018 01:19 PM   Modules accepted: Orders

## 2019-01-01 LAB — COMPREHENSIVE METABOLIC PANEL
ALT: 17 IU/L (ref 0–32)
AST: 24 IU/L (ref 0–40)
Albumin/Globulin Ratio: 1.7 (ref 1.2–2.2)
Albumin: 4.7 g/dL (ref 3.8–4.9)
Alkaline Phosphatase: 99 IU/L (ref 39–117)
BUN/Creatinine Ratio: 19 (ref 9–23)
BUN: 13 mg/dL (ref 6–24)
Bilirubin Total: 0.6 mg/dL (ref 0.0–1.2)
CO2: 25 mmol/L (ref 20–29)
Calcium: 9.7 mg/dL (ref 8.7–10.2)
Chloride: 98 mmol/L (ref 96–106)
Creatinine, Ser: 0.68 mg/dL (ref 0.57–1.00)
GFR calc Af Amer: 116 mL/min/{1.73_m2} (ref >59)
GFR calc non Af Amer: 101 mL/min/{1.73_m2} (ref >59)
Globulin, Total: 2.7 g/dL (ref 1.5–4.5)
Glucose: 91 mg/dL (ref 65–99)
Potassium: 4.2 mmol/L (ref 3.5–5.2)
Sodium: 138 mmol/L (ref 134–144)
Total Protein: 7.4 g/dL (ref 6.0–8.5)

## 2019-01-01 LAB — LIPID PANEL
Chol/HDL Ratio: 2.6 ratio (ref 0.0–4.4)
Cholesterol, Total: 226 mg/dL — ABNORMAL HIGH (ref 100–199)
HDL: 87 mg/dL
LDL Chol Calc (NIH): 126 mg/dL — ABNORMAL HIGH (ref 0–99)
Triglycerides: 76 mg/dL (ref 0–149)
VLDL Cholesterol Cal: 13 mg/dL (ref 5–40)

## 2019-01-01 LAB — CBC
Hematocrit: 41.4 % (ref 34.0–46.6)
Hemoglobin: 14 g/dL (ref 11.1–15.9)
MCH: 31.7 pg (ref 26.6–33.0)
MCHC: 33.8 g/dL (ref 31.5–35.7)
MCV: 94 fL (ref 79–97)
Platelets: 321 10*3/uL (ref 150–450)
RBC: 4.41 x10E6/uL (ref 3.77–5.28)
RDW: 11.6 % — ABNORMAL LOW (ref 11.7–15.4)
WBC: 7.4 10*3/uL (ref 3.4–10.8)

## 2019-01-02 ENCOUNTER — Ambulatory Visit: Payer: Managed Care, Other (non HMO) | Admitting: Obstetrics and Gynecology

## 2019-01-08 LAB — PAP LB AND HPV HIGH-RISK: HPV, high-risk: NEGATIVE

## 2019-01-09 ENCOUNTER — Telehealth: Payer: Self-pay

## 2019-01-09 NOTE — Telephone Encounter (Signed)
Left message to call Estill Bamberg, CMA or she can review her MyChart message regarding test results.

## 2019-01-09 NOTE — Telephone Encounter (Signed)
-----   Message from Nunzio Cobbs, MD sent at 01/01/2019  2:36 PM EDT ----- Results to patient through My Chart.  Hello Regina Schultz,   I am sharing your test results from your recent visit.   Your cholesterol is mildly elevated.   This is for 2 reasons.  Your HDL cholesterol, the good one, is really high.  (This is desirable.)   The LDL cholesterol, the bad one, is mildly elevated.  Overall the ratios of the cholesterol are good.  You can lower your cholesterol through a diet low in cholesterol and by increasing vigorous exercise.   Your blood counts and blood chemistries were normal.   Please contact the office for any questions.   Have a good evening!  Josefa Half, MD

## 2019-01-10 ENCOUNTER — Ambulatory Visit: Payer: Managed Care, Other (non HMO) | Admitting: Obstetrics and Gynecology

## 2019-01-15 NOTE — Telephone Encounter (Signed)
Patient notified of all lab results and pap results on 01-09-19. See result note.

## 2019-01-28 ENCOUNTER — Telehealth: Payer: Self-pay | Admitting: *Deleted

## 2019-01-28 NOTE — Telephone Encounter (Signed)
This RN returned VM left by pt requesting a call but without further informatioin.

## 2019-02-05 ENCOUNTER — Other Ambulatory Visit: Payer: Self-pay

## 2019-02-05 DIAGNOSIS — Z0131 Encounter for examination of blood pressure with abnormal findings: Secondary | ICD-10-CM

## 2019-02-05 MED ORDER — AMLODIPINE BESYLATE 2.5 MG PO TABS: ORAL_TABLET | ORAL | 3 refills | Status: DC

## 2019-02-07 ENCOUNTER — Telehealth: Payer: Self-pay

## 2019-02-07 NOTE — Telephone Encounter (Signed)
CONFIRMED AND SCREENED FOR 02-11-19 OV.

## 2019-02-11 ENCOUNTER — Ambulatory Visit: Payer: Managed Care, Other (non HMO) | Admitting: Adult Health

## 2019-02-11 ENCOUNTER — Encounter: Payer: Self-pay | Admitting: Adult Health

## 2019-02-11 ENCOUNTER — Other Ambulatory Visit: Payer: Self-pay

## 2019-02-11 VITALS — BP 133/89 | HR 63 | Temp 97.6°F | Resp 16 | Ht 63.0 in | Wt 126.0 lb

## 2019-02-11 DIAGNOSIS — I10 Essential (primary) hypertension: Secondary | ICD-10-CM

## 2019-02-11 DIAGNOSIS — F411 Generalized anxiety disorder: Secondary | ICD-10-CM

## 2019-02-11 MED ORDER — ESCITALOPRAM OXALATE 20 MG PO TABS: 20.0000 mg | ORAL_TABLET | Freq: Every day | ORAL | 2 refills | Status: DC
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
## 2019-02-12 ENCOUNTER — Telehealth: Payer: Self-pay | Admitting: *Deleted

## 2019-02-12 NOTE — Telephone Encounter (Signed)
02/12/2019 Research - [facility] IBCSG 25-02 (TEXT) Month 180 visit Spoke with patient briefly by phone regarding annual telephone call follow-up visit. Patient stated she had some questions to ask and requested to call me back as she was currently on another call. Gave my direct call-back number [phone]. Cindy S. Brigitte Pulse BSN, RN, [place] 02/12/2019 3:09 PM

## 2019-02-13 ENCOUNTER — Telehealth: Payer: Self-pay | Admitting: *Deleted

## 2019-02-13 NOTE — Telephone Encounter (Signed)
02/13/2019 Research - [facility] IBCSG 25-02 (TEXT) Month 180 visit Left voice mail message in follow-up to yesterday's call. Requested that she contact me at [phone] for annual study follow-up interview. Cindy S. Brigitte Pulse BSN, RN, CCRP 02/13/2019 1:42 PM

## 2019-02-14 ENCOUNTER — Telehealth: Payer: Self-pay | Admitting: *Deleted

## 2019-02-14 NOTE — Telephone Encounter (Signed)
02/14/2019 Research - [facility] IBCSG 25-02 (TEXT) Month 180 visit Left voice mail message again for patient today regarding annual study follow-up call. Requested that patient contact me at [phone] to leave a message regarding preferred time next week for me to contact her. Cindy S. Brigitte Pulse BSN, RN, CCRP 02/14/2019 3:25 PM

## 2019-02-18 ENCOUNTER — Encounter

## 2019-02-18 ENCOUNTER — Telehealth: Payer: Self-pay | Admitting: *Deleted

## 2019-02-18 ENCOUNTER — Ambulatory Visit: Payer: Managed Care, Other (non HMO) | Admitting: Obstetrics and Gynecology

## 2019-02-18 NOTE — Telephone Encounter (Signed)
02/18/2019 Research - [facility] IBCSG 25-02 (TEXT) Month 180 visit Left message for patient to contact me directly at [phone]; however, since I have not received a return call, also explained to patient that I will mail a letter to her with information we are requesting as part of study follow-up. Cindy S. Brigitte Pulse BSN, RN, [place] 02/18/2019 11:04 AM

## 2019-02-21 ENCOUNTER — Other Ambulatory Visit: Payer: Self-pay | Admitting: Oncology

## 2019-02-21 ENCOUNTER — Telehealth: Payer: Self-pay | Admitting: *Deleted

## 2019-02-21 NOTE — Telephone Encounter (Signed)
02/21/2019 During telephone call with patient today for research study follow-up, patient inquired about assessment of cancer risk and advice for her daughters. Patient believes it has been several years since she herself underwent genetic screening here at [facility]. Explained to patient that I would send a message through to Dr. Jana Hakim and his nurse to advise if patient and/or family members should be referred for new and/or updated screening. Advised patient to contact the clinic (either Dr. Virgie Dad nurse or myself) if she has not received follow-up contact after the first of the year. Cindy S. Brigitte Pulse BSN, RN, CCRP 02/21/2019 3:53 PM

## 2019-02-21 NOTE — Telephone Encounter (Signed)
02/21/2019   Research - [facility]TS[facility] IBCSG 25-02 (TEXT) Month 180 visit   Received return phone call from patient today. Telephone interview conducted regarding the following:  Bone densitometry: No DEXA scan performed in the past year.  Performance status: Patient reports that she is fully active with no restrictions (KPS = 100%)  Menstrual status: Patient's status is post-menopausal as of February 2018. Information regarding gynecologic procedures over the past year will be obtained from her shared gynecologic records.  Adverse Events: Patient has not developed any serious health problem over the past year. She denies any significant cardiovascular or cerebrovascular events. She denies any bone fractures over the past year. There have been no severe (> grade 3) adverse events during that time.  Malignancies:  Patient denies the development of any new cancers. 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  Additional treatment: She has not taken any additional endocrine therapy or bisphosphonates for prevention of breast cancer recurrence.   Patient will continue to receive her care from her gynecologist for annual assessments. Patient is aware that she will be contacted by the research nurse by phone for any additional information that is needed on an annual basis. Thanked patient for her participation in this study.  Cindy S. Brigitte Pulse BSN, RN, CCRP 02/21/2019 3:50 PM

## 2019-02-24 ENCOUNTER — Telehealth: Payer: Self-pay | Admitting: Genetic Counselor

## 2019-02-24 NOTE — Telephone Encounter (Signed)
I LEFT A message regarding video visit

## 2019-03-05 ENCOUNTER — Encounter: Payer: Self-pay | Admitting: Genetic Counselor

## 2019-03-05 ENCOUNTER — Inpatient Hospital Stay: Payer: Managed Care, Other (non HMO) | Attending: Genetic Counselor | Admitting: Genetic Counselor

## 2019-03-05 DIAGNOSIS — Z8042 Family history of malignant neoplasm of prostate: Secondary | ICD-10-CM | POA: Insufficient documentation

## 2019-03-05 DIAGNOSIS — Z1379 Encounter for other screening for genetic and chromosomal anomalies: Secondary | ICD-10-CM | POA: Diagnosis not present

## 2019-03-05 DIAGNOSIS — Z803 Family history of malignant neoplasm of breast: Secondary | ICD-10-CM | POA: Diagnosis not present

## 2019-03-05 DIAGNOSIS — C50412 Malignant neoplasm of upper-outer quadrant of left female breast: Secondary | ICD-10-CM

## 2019-03-05 DIAGNOSIS — Z806 Family history of leukemia: Secondary | ICD-10-CM | POA: Insufficient documentation

## 2019-03-05 DIAGNOSIS — Z8052 Family history of malignant neoplasm of bladder: Secondary | ICD-10-CM

## 2019-03-05 DIAGNOSIS — Z800 Family history of malignant neoplasm of digestive organs: Secondary | ICD-10-CM | POA: Insufficient documentation

## 2019-03-05 DIAGNOSIS — Z808 Family history of malignant neoplasm of other organs or systems: Secondary | ICD-10-CM | POA: Insufficient documentation

## 2019-03-05 DIAGNOSIS — Z170 Estrogen receptor positive status [ER+]: Secondary | ICD-10-CM
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
## 2019-03-07 DIAGNOSIS — U071 COVID-19: Secondary | ICD-10-CM

## 2019-03-07 HISTORY — DX: COVID-19: U07.1

## 2019-03-12 ENCOUNTER — Other Ambulatory Visit: Payer: Self-pay | Admitting: Genetic Counselor

## 2019-03-12 DIAGNOSIS — Z170 Estrogen receptor positive status [ER+]: Secondary | ICD-10-CM

## 2019-03-12 DIAGNOSIS — C50412 Malignant neoplasm of upper-outer quadrant of left female breast: Secondary | ICD-10-CM

## 2019-03-13 ENCOUNTER — Inpatient Hospital Stay: Payer: Managed Care, Other (non HMO)

## 2019-03-21 ENCOUNTER — Telehealth: Payer: Self-pay

## 2019-03-21 NOTE — Telephone Encounter (Signed)
Called lmom informing patient of virtual appointment. klh 

## 2019-03-24 ENCOUNTER — Telehealth: Payer: Self-pay | Admitting: Genetic Counselor

## 2019-03-24 NOTE — Telephone Encounter (Signed)
Returned patient's phone call regarding rescheduling 01/19 appointment, per patient's request appointment has moved to 01/21.

## 2019-03-25 ENCOUNTER — Inpatient Hospital Stay: Payer: Managed Care, Other (non HMO)

## 2019-03-25 ENCOUNTER — Ambulatory Visit: Payer: Managed Care, Other (non HMO) | Admitting: Adult Health

## 2019-03-27 ENCOUNTER — Inpatient Hospital Stay: Payer: Managed Care, Other (non HMO) | Attending: Genetic Counselor

## 2019-03-27 ENCOUNTER — Other Ambulatory Visit: Payer: Self-pay

## 2019-03-27 DIAGNOSIS — C50412 Malignant neoplasm of upper-outer quadrant of left female breast: Secondary | ICD-10-CM

## 2019-03-27 DIAGNOSIS — Z170 Estrogen receptor positive status [ER+]: Secondary | ICD-10-CM

## 2019-03-28 ENCOUNTER — Telehealth: Payer: Self-pay

## 2019-03-28 LAB — GENETIC SCREENING ORDER

## 2019-03-28 NOTE — Telephone Encounter (Signed)
Called lmom informing patient of virtual visit. klh 

## 2019-04-01 ENCOUNTER — Ambulatory Visit: Payer: Managed Care, Other (non HMO) | Admitting: Adult Health

## 2019-04-03 ENCOUNTER — Telehealth: Payer: Self-pay

## 2019-04-03 NOTE — Telephone Encounter (Signed)
Called lmom informing patient of appointment. klh 

## 2019-04-07 ENCOUNTER — Ambulatory Visit: Payer: Managed Care, Other (non HMO) | Admitting: Adult Health

## 2019-04-16 ENCOUNTER — Telehealth: Payer: Self-pay

## 2019-04-16 ENCOUNTER — Telehealth: Payer: Self-pay | Admitting: Genetic Counselor

## 2019-04-16 NOTE — Telephone Encounter (Signed)
LVM that her genetic test results are available and requested that she call back to discuss them.  

## 2019-04-16 NOTE — Telephone Encounter (Signed)
BILLED PATIENT MISSED APPOINTMENT FEE 04/07/2019.

## 2019-04-23 ENCOUNTER — Ambulatory Visit: Payer: Self-pay | Admitting: Genetic Counselor

## 2019-04-23 DIAGNOSIS — Z1379 Encounter for other screening for genetic and chromosomal anomalies: Secondary | ICD-10-CM | POA: Insufficient documentation

## 2019-04-23 NOTE — Telephone Encounter (Signed)
Revealed negative genetic testing.  Discussed that we still do not know why she has breast cancer or why there is cancer in the family.  It could be due to a different gene that we are not testing, or our current technology may not be able detect something.  It will be important for her to keep in contact with genetics to keep up with whether additional testing may be appropriate in the future.

## 2019-04-23 NOTE — Progress Notes (Signed)
HPI:  Ms. Brook was previously seen in the [facility] clinic for updated genetic testing due to a personal and family history of young-onset breast cancer and concerns regarding a hereditary predisposition to cancer. Please refer to our prior cancer genetics clinic note for more information regarding our discussion, assessment and recommendations, at the time. Ms. Grosshans recent genetic test results were disclosed to her, as were recommendations warranted by these results. These results and recommendations are discussed in more detail below.  CANCER HISTORY:  Oncology History  Breast cancer of upper-outer quadrant of left female breast ([place])  01/21/2015 Initial Diagnosis   Breast cancer of upper-outer quadrant of left female breast ([place])   04/14/2019 Genetic Testing   Negative genetic testing:  No pathogenic variants detected on the [facility] CancerNext-Expanded + RNAinsight panel. The report date is 04/14/2019.  The CancerNext-Expanded + RNAinsight gene panel offered by [employer] and includes sequencing and rearrangement analysis for the following 77 genes: AIP, ALK, APC*, ATM*, AXIN2, BAP1, BARD1, BLM, BMPR1A, BRCA1*, BRCA2*, BRIP1*, CDC73, CDH1*, CDK4, CDKN1B, CDKN2A, CHEK2*, CTNNA1, DICER1, FANCC, FH, FLCN, GALNT12, KIF1B, LZTR1, MAX, MEN1, MET, MLH1*, MSH2*, MSH3, MSH6*, MUTYH*, NBN, NF1*, NF2, NTHL1, PALB2*, PHOX2B, PMS2*, POT1, PRKAR1A, PTCH1, PTEN*, RAD51C*, RAD51D*, RB1, RECQL, RET, SDHA, SDHAF2, SDHB, SDHC, SDHD, SMAD4, SMARCA4, SMARCB1, SMARCE1, STK11, SUFU, TMEM127, TP53*, TSC1, TSC2, VHL and XRCC2 (sequencing and deletion/duplication); EGFR, EGLN1, HOXB13, KIT, MITF, PDGFRA, POLD1 and POLE (sequencing only); EPCAM and GREM1 (deletion/duplication only). DNA and RNA analyses performed for * genes.      FAMILY HISTORY:  We obtained a detailed, 4-generation family history.  Significant diagnoses are listed below: Family History  Problem Relation Age of Onset  . Breast cancer  Mother 34       dec 75 from kidney failure  . Hypertension Mother   . Thyroid disease Mother   . Lung cancer Maternal Aunt        heavy smoker  . Brain cancer Maternal Uncle 60  . Bladder Cancer Paternal Aunt   . Prostate cancer Paternal Uncle        diagnosed in his 39s-70s  . Stroke Maternal Grandmother   . Hypertension Maternal Grandmother   . Stomach cancer Maternal Grandfather        possible stomach cancer vs. another GI cancer  . Heart attack Paternal Grandmother   . Hypertension Paternal Grandmother   . Heart attack Paternal Grandfather   . Breast cancer Cousin        maternal cousin dx in her 53s  . Leukemia Paternal Aunt   . Breast cancer Cousin        paternal cousin diagnosed in her 27s  . Heart disease Father   . Prostate cancer Cousin        paternal cousin dx in his 19s  . Breast cancer Cousin 30       paternal 4th degree relative  . Breast cancer Cousin        maternal cousin dx in her mid 46s   Ms. Fei has two daughters who are 67 and 86 and have not had cancer. She has one brother who has not had cancer, nor have any of his children.  Ms. Fernando mother was diagnosed with breast cancer at age 51. She has three maternal aunts and two maternal uncles. One of her maternal aunts had lung cancer and died in her 67s. An uncle had brain cancer and died when he was 72. Two of her maternal  first cousins have had breast cancer - one diagnosed in her late 55s and another diagnosed in her mid-50s. Her maternal grandmother died at age 78 from a stroke, and her grandfather died in his 52s with some form of GI cancer, possibly stomach cancer. There are no other known diagnoses of cancer on the maternal side of the family.  Ms. Prien father does not have a history of cancer. She has two paternal uncles and three paternal aunts. One of her uncles had prostate cancer that was not metastatic in his 56s or 23s. One aunt had leukemia and died in her 53s, and another aunt had bladder  cancer and died in her 49s. Ms. Christian paternal grandmother died in her 50s, and her grandfather died at age 17 from a heart attack. She has a paternal first cousin who had breast cancer diagnosed in her 42s, a first cousin who had prostate cancer that was not metastatic diagnosed in his 94s, and a first cousin once removed (4th degree relative) who had breast cancer diagnosed at age 3. There are no other known diagnoses of cancer on the paternal side of the family.  Ms. Wollenberg is unaware of previous family history of genetic testing for hereditary cancer risks. Patient's maternal ancestors are of Native American descent, and paternal ancestors are of [place] descent. There is no reported Ashkenazi Jewish ancestry. There is no known consanguinity.  GENETIC TEST RESULTS: Genetic testing reported out on 04/14/2019 through the [facility] + RNAinsight panel. No pathogenic variants were detected.   The CancerNext-Expanded + RNAinsight gene panel offered by [employer] and includes sequencing and rearrangement analysis for the following 77 genes: AIP, ALK, APC*, ATM*, AXIN2, BAP1, BARD1, BLM, BMPR1A, BRCA1*, BRCA2*, BRIP1*, CDC73, CDH1*, CDK4, CDKN1B, CDKN2A, CHEK2*, CTNNA1, DICER1, FANCC, FH, FLCN, GALNT12, KIF1B, LZTR1, MAX, MEN1, MET, MLH1*, MSH2*, MSH3, MSH6*, MUTYH*, NBN, NF1*, NF2, NTHL1, PALB2*, PHOX2B, PMS2*, POT1, PRKAR1A, PTCH1, PTEN*, RAD51C*, RAD51D*, RB1, RECQL, RET, SDHA, SDHAF2, SDHB, SDHC, SDHD, SMAD4, SMARCA4, SMARCB1, SMARCE1, STK11, SUFU, TMEM127, TP53*, TSC1, TSC2, VHL and XRCC2 (sequencing and deletion/duplication); EGFR, EGLN1, HOXB13, KIT, MITF, PDGFRA, POLD1 and POLE (sequencing only); EPCAM and GREM1 (deletion/duplication only). DNA and RNA analyses performed for * genes. The test report will be scanned into EPIC and located under the Molecular Pathology section of the Results Review tab.  A portion of the result report is included below for reference.     We discussed with  Ms. Stainback that because current genetic testing is not perfect, it is possible there may be a gene mutation in one of these genes that current testing cannot detect, but that chance is small.  We also discussed, that there could be another gene that has not yet been discovered, or that we have not yet tested, that is responsible for the cancer diagnoses in the family. It is also possible there is a hereditary cause for the cancer in the family that Ms. Lupo did not inherit and therefore was not identified in her testing.  Therefore, it is important to remain in touch with cancer genetics in the future so that we can continue to offer Ms. Morneault the most up to date genetic testing.   CANCER SCREENING RECOMMENDATIONS: Ms. Repetto test result is considered negative (normal).  This means that we have not identified a hereditary cause for her personal and family history of cancer at this time.  Given Ms. Neumann's personal and family histories, we must interpret these negative results with some caution.  Families with features suggestive of hereditary risk for cancer tend to have multiple family members with cancer, diagnoses in multiple generations and diagnoses before the age of 51. Ms. Plotner family exhibits some of these features. Thus, this result may simply reflect our current inability to detect all mutations within these genes or there may be a different gene that has not yet been discovered or tested.   An individual's cancer risk and medical management are not determined by genetic test results alone. Overall cancer risk assessment incorporates additional factors, including personal medical history, family history, and any available genetic information that may result in a personalized plan for cancer prevention and surveillance.  RECOMMENDATIONS FOR FAMILY MEMBERS:  Individuals in this family might be at some increased risk of developing cancer, over the general population risk, simply due to the family history  of cancer.  We recommended women in this family have a yearly mammogram beginning at age 7, or 60 years younger than the earliest onset of cancer, an annual clinical breast exam, and perform monthly breast self-exams. Women in this family should also have a gynecological exam as recommended by their primary provider. All family members should have a colonoscopy by age 58.  FOLLOW-UP: Lastly, we discussed with Ms. Deblois that cancer genetics is a rapidly advancing field and it is possible that new genetic tests will be appropriate for her and/or her family members in the future. We encouraged her to remain in contact with cancer genetics on an annual basis so we can update her personal and family histories and let her know of advances in cancer genetics that may benefit this family.   Our contact number was provided. Ms. Wassel questions were answered to her satisfaction, and she knows she is welcome to call us at anytime with additional questions or concerns.   Clint Guy, MS, [facility] Genetic Counselor [email or web address] Phone: [phone]

## 2019-05-13 ENCOUNTER — Ambulatory Visit: Payer: Managed Care, Other (non HMO) | Attending: Internal Medicine

## 2019-05-13 DIAGNOSIS — Z23 Encounter for immunization: Secondary | ICD-10-CM | POA: Insufficient documentation

## 2019-05-13 NOTE — Progress Notes (Signed)
   Covid-19 Vaccination Clinic  Name:  NYDA CHEESMAN    MRN: [id number] DOB: 12-02-1965  05/13/2019  Ms. Berenguer was observed post Covid-19 immunization for 15 minutes without incident. She was provided with Vaccine Information Sheet and instruction to access the V-Safe system.   Ms. Kubilus was instructed to call 911 with any severe reactions post vaccine: Marland Kitchen Difficulty breathing  . Swelling of face and throat  . A fast heartbeat  . A bad rash all over body  . Dizziness and weakness   Immunizations Administered    Name Date Dose VIS Date Route   Pfizer COVID-19 Vaccine 05/13/2019  5:07 PM 0.3 mL 02/14/2019 Intramuscular   Manufacturer: [place]   Lot: [id number]   [place]: [id number]

## 2019-05-17 ENCOUNTER — Ambulatory Visit: Payer: Managed Care, Other (non HMO)

## 2019-05-19 ENCOUNTER — Telehealth: Payer: Self-pay

## 2019-05-19 NOTE — Telephone Encounter (Signed)
Confirmed appointment on 05/21/2019 and screened for covid. klh  

## 2019-05-21 ENCOUNTER — Ambulatory Visit: Payer: Managed Care, Other (non HMO) | Admitting: Adult Health

## 2019-05-21 ENCOUNTER — Encounter: Payer: Self-pay | Admitting: Adult Health

## 2019-05-21 ENCOUNTER — Other Ambulatory Visit: Payer: Self-pay

## 2019-05-21 VITALS — BP 133/83 | HR 62 | Temp 97.6°F | Resp 16 | Ht 63.0 in | Wt 127.0 lb

## 2019-05-21 DIAGNOSIS — I10 Essential (primary) hypertension: Secondary | ICD-10-CM | POA: Diagnosis not present

## 2019-05-21 DIAGNOSIS — R0789 Other chest pain: Secondary | ICD-10-CM

## 2019-05-21 DIAGNOSIS — E782 Mixed hyperlipidemia: Secondary | ICD-10-CM | POA: Diagnosis not present

## 2019-05-21 DIAGNOSIS — R5383 Other fatigue: Secondary | ICD-10-CM

## 2019-05-21 DIAGNOSIS — R011 Cardiac murmur, unspecified: Secondary | ICD-10-CM

## 2019-05-21 DIAGNOSIS — F411 Generalized anxiety disorder: Secondary | ICD-10-CM

## 2019-05-21 MED ORDER — BUPROPION HCL ER (SR) 150 MG PO TB12: 150.0000 mg | ORAL_TABLET | Freq: Two times a day (BID) | ORAL | 1 refills | Status: DC
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
## 2019-06-03 ENCOUNTER — Ambulatory Visit: Payer: Managed Care, Other (non HMO) | Attending: Internal Medicine

## 2019-06-03 DIAGNOSIS — Z23 Encounter for immunization: Secondary | ICD-10-CM

## 2019-06-03 NOTE — Progress Notes (Signed)
   Covid-19 Vaccination Clinic  Name:  Regina Schultz    MRN: [id number] DOB: [birth year]  06/03/2019  Ms. Toledano was observed post Covid-19 immunization for 15 minutes without incident. She was provided with Vaccine Information Sheet and instruction to access the V-Safe system.   Ms. Chaplain was instructed to call 911 with any severe reactions post vaccine: Marland Kitchen Difficulty breathing  . Swelling of face and throat  . A fast heartbeat  . A bad rash all over body  . Dizziness and weakness   Immunizations Administered    Name Date Dose VIS Date Route   Pfizer COVID-19 Vaccine 06/03/2019  1:18 PM 0.3 mL 02/14/2019 Intramuscular   Manufacturer: [place]   Lot: [id number]   [place]: [id number]

## 2019-06-04 ENCOUNTER — Ambulatory Visit: Payer: Managed Care, Other (non HMO) | Admitting: Internal Medicine

## 2019-06-22 ENCOUNTER — Other Ambulatory Visit: Payer: Self-pay | Admitting: Adult Health

## 2019-06-22 DIAGNOSIS — F411 Generalized anxiety disorder: Secondary | ICD-10-CM

## 2019-06-30 ENCOUNTER — Telehealth: Payer: Self-pay

## 2019-06-30 NOTE — Telephone Encounter (Signed)
Patient rescheduled appointment on 07/02/2019 to 07/08/2019. klh

## 2019-07-02 ENCOUNTER — Ambulatory Visit: Payer: Managed Care, Other (non HMO) | Admitting: Adult Health

## 2019-07-04 ENCOUNTER — Telehealth: Payer: Self-pay

## 2019-07-04 NOTE — Telephone Encounter (Signed)
Patient rescheduled appointment on 07/08/2019 to 07/15/2019. klh

## 2019-07-08 ENCOUNTER — Ambulatory Visit: Payer: Managed Care, Other (non HMO) | Admitting: Adult Health

## 2019-07-11 ENCOUNTER — Telehealth: Payer: Self-pay

## 2019-07-11 NOTE — Telephone Encounter (Signed)
Called lmom informing patient of appointment on 07/15/2019. klh

## 2019-07-15 ENCOUNTER — Ambulatory Visit: Payer: Managed Care, Other (non HMO) | Admitting: Adult Health

## 2019-07-18 ENCOUNTER — Telehealth: Payer: Self-pay

## 2019-07-18 NOTE — Telephone Encounter (Signed)
Left message and asked pt to call back regarding missed appointment

## 2019-08-06 ENCOUNTER — Ambulatory Visit: Payer: Managed Care, Other (non HMO) | Admitting: Cardiovascular Disease

## 2019-09-16 ENCOUNTER — Other Ambulatory Visit: Payer: Self-pay

## 2019-09-16 DIAGNOSIS — F411 Generalized anxiety disorder: Secondary | ICD-10-CM

## 2019-09-16 MED ORDER — ESCITALOPRAM OXALATE 20 MG PO TABS: 20.0000 mg | ORAL_TABLET | Freq: Every day | ORAL | 2 refills | Status: DC

## 2019-10-05 HISTORY — PX: GUM SURGERY: SHX658

## 2019-10-13 ENCOUNTER — Ambulatory Visit: Payer: Managed Care, Other (non HMO) | Admitting: Cardiovascular Disease

## 2019-10-22 ENCOUNTER — Other Ambulatory Visit: Payer: Self-pay | Admitting: Internal Medicine

## 2019-10-22 DIAGNOSIS — F411 Generalized anxiety disorder: Secondary | ICD-10-CM

## 2019-12-04 ENCOUNTER — Other Ambulatory Visit: Payer: Self-pay | Admitting: Internal Medicine

## 2019-12-04 DIAGNOSIS — F411 Generalized anxiety disorder: Secondary | ICD-10-CM

## 2019-12-21 ENCOUNTER — Other Ambulatory Visit: Payer: Self-pay | Admitting: Adult Health

## 2019-12-21 DIAGNOSIS — F411 Generalized anxiety disorder: Secondary | ICD-10-CM

## 2019-12-22 NOTE — Telephone Encounter (Signed)
I refilled her meds, please make a follow up in 3 month with TH for f/u or cpe if needed

## 2019-12-31 ENCOUNTER — Other Ambulatory Visit: Payer: Self-pay | Admitting: Obstetrics and Gynecology

## 2019-12-31 NOTE — Telephone Encounter (Signed)
Medication refill request: Valtrex Last AEX:  12/31/18 Dr. Quincy Simmonds  Next AEX: 01/05/20 Last MMG (if hormonal medication request): n/a Refill authorized: today, please advise

## 2020-01-05 ENCOUNTER — Encounter: Payer: Self-pay | Admitting: Obstetrics and Gynecology

## 2020-01-05 ENCOUNTER — Ambulatory Visit: Payer: Managed Care, Other (non HMO) | Admitting: Obstetrics and Gynecology

## 2020-01-05 ENCOUNTER — Other Ambulatory Visit: Payer: Self-pay

## 2020-01-05 VITALS — BP 110/66 | HR 62 | Ht 63.0 in | Wt 130.0 lb

## 2020-01-05 DIAGNOSIS — Z01419 Encounter for gynecological examination (general) (routine) without abnormal findings: Secondary | ICD-10-CM

## 2020-01-05 DIAGNOSIS — R970 Elevated carcinoembryonic antigen [CEA]: Secondary | ICD-10-CM

## 2020-01-05 NOTE — Patient Instructions (Signed)
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
## 2020-01-06 LAB — VITAMIN D 25 HYDROXY (VIT D DEFICIENCY, FRACTURES): Vit D, 25-Hydroxy: 74 ng/mL (ref 30.0–100.0)

## 2020-01-06 LAB — BASIC METABOLIC PANEL
BUN/Creatinine Ratio: 15 (ref 9–23)
BUN: 11 mg/dL (ref 6–24)
CO2: 29 mmol/L (ref 20–29)
Calcium: 9.4 mg/dL (ref 8.7–10.2)
Chloride: 93 mmol/L — ABNORMAL LOW (ref 96–106)
Creatinine, Ser: 0.71 mg/dL (ref 0.57–1.00)
GFR calc Af Amer: 112 mL/min/{1.73_m2} (ref >59)
GFR calc non Af Amer: 98 mL/min/{1.73_m2} (ref >59)
Glucose: 83 mg/dL (ref 65–99)
Potassium: 4.1 mmol/L (ref 3.5–5.2)
Sodium: 133 mmol/L — ABNORMAL LOW (ref 134–144)

## 2020-01-07 ENCOUNTER — Telehealth: Payer: Self-pay

## 2020-01-07 LAB — IGP, APTIMA HPV: HPV Aptima: NEGATIVE

## 2020-01-07 NOTE — Telephone Encounter (Signed)
Call to patient. Per DPR, OK to leave message on voicemail.   Left voicemail requesting a return call to Laurence Folz to review benefits and schedule recommended Pelvic ultrasound with Brook A. Silva, MD, FACOG 

## 2020-01-15 NOTE — Telephone Encounter (Signed)
Spoke with patient regarding benefits for recommended ultrasound. Patient is aware that ultrasound is transvaginal. Patient acknowledges understanding of information presented. Patient is aware of cancellation policy. Patient scheduled appointment for 01/27/2020 at 0300PM with Brook A. Quincy Simmonds, MD, Cherlynn June. Encounter closed.

## 2020-01-21 ENCOUNTER — Encounter: Payer: Self-pay | Admitting: General Practice

## 2020-01-21 ENCOUNTER — Ambulatory Visit: Payer: Managed Care, Other (non HMO) | Admitting: Cardiovascular Disease

## 2020-01-22 ENCOUNTER — Telehealth: Payer: Self-pay

## 2020-01-22 NOTE — Telephone Encounter (Signed)
Patient is returning call.  °

## 2020-01-22 NOTE — Telephone Encounter (Signed)
Patient notified of lab results, normal pap/Neg HR HPV.

## 2020-01-22 NOTE — Telephone Encounter (Signed)
Left message to call Gwenevere Goga, CMA. °

## 2020-01-22 NOTE — Telephone Encounter (Signed)
-----   Message from Nunzio Cobbs, MD sent at 01/07/2020  1:05 PM EDT ----- Results to patient through My Chart.  Hi [name],   I have reviewed your blood work.  Your vitamin D level is normal. Your sodium and chloride are just under the limits of normal.  Nothing specific needs to be done.   Your pap is not back yet.   Please contact the office for any questions.   Have a good afternoon!  Josefa Half, MD

## 2020-01-26 ENCOUNTER — Telehealth: Payer: Self-pay

## 2020-01-26 DIAGNOSIS — R970 Elevated carcinoembryonic antigen [CEA]: Secondary | ICD-10-CM

## 2020-01-26 NOTE — Telephone Encounter (Signed)
Patient cancelled ultra sound due to being out of town. Patient would like to reschedule.

## 2020-01-26 NOTE — Telephone Encounter (Signed)
Spoke with pt. Pt states needing to reschedule PUS due to out of town this week for Thanksgiving.  Pt scheduled for 12/2 at 10 am. Pt agreeable and verbalized understanding to date and time of appt. Cancellation policy reviewed.  New PUS orders placed  Routing to Dr Quincy Simmonds for Juluis Rainier Cc: [facility] for update on precert. Pt aware of PR.

## 2020-01-27 ENCOUNTER — Other Ambulatory Visit: Payer: Managed Care, Other (non HMO) | Admitting: Obstetrics and Gynecology

## 2020-01-27 ENCOUNTER — Other Ambulatory Visit: Payer: Managed Care, Other (non HMO)

## 2020-02-02 ENCOUNTER — Telehealth (HOSPITAL_COMMUNITY): Payer: Self-pay | Admitting: Emergency Medicine

## 2020-02-02 ENCOUNTER — Ambulatory Visit (HOSPITAL_COMMUNITY)
Admission: RE | Admit: 2020-02-02 | Discharge: 2020-02-02 | Disposition: A | Discharge status: Home / Self Care | Payer: Managed Care, Other (non HMO) | Source: Ambulatory Visit | Attending: Pulmonary Disease | Admitting: Pulmonary Disease

## 2020-02-02 ENCOUNTER — Telehealth: Payer: Self-pay

## 2020-02-02 ENCOUNTER — Other Ambulatory Visit (HOSPITAL_COMMUNITY): Payer: Self-pay | Admitting: Family

## 2020-02-02 DIAGNOSIS — I10 Essential (primary) hypertension: Secondary | ICD-10-CM | POA: Insufficient documentation

## 2020-02-02 DIAGNOSIS — U071 COVID-19: Secondary | ICD-10-CM

## 2020-02-02 MED ORDER — METHYLPREDNISOLONE SODIUM SUCC 125 MG IJ SOLR: 125.0000 mg | Freq: Once | INTRAMUSCULAR | Status: DC | PRN

## 2020-02-02 MED ORDER — FAMOTIDINE IN NACL 20-0.9 MG/50ML-% IV SOLN: 20.0000 mg | Freq: Once | INTRAVENOUS | Status: DC | PRN

## 2020-02-02 MED ORDER — EPINEPHRINE 0.3 MG/0.3ML IJ SOAJ: 0.3000 mg | Freq: Once | INTRAMUSCULAR | Status: DC | PRN

## 2020-02-02 MED ORDER — DIPHENHYDRAMINE HCL 50 MG/ML IJ SOLN: 50.0000 mg | Freq: Once | INTRAMUSCULAR | Status: DC | PRN

## 2020-02-02 MED ORDER — SODIUM CHLORIDE 0.9 % IV SOLN: INTRAVENOUS | Status: DC | PRN

## 2020-02-02 MED ORDER — ALBUTEROL SULFATE HFA 108 (90 BASE) MCG/ACT IN AERS: 2.0000 | INHALATION_SPRAY | Freq: Once | RESPIRATORY_TRACT | Status: DC | PRN

## 2020-02-02 MED ORDER — SOTROVIMAB 500 MG/8ML IV SOLN
500.0000 mg | Freq: Once | INTRAVENOUS | Status: AC
  Administered 2020-02-02: 500 mg via INTRAVENOUS

## 2020-02-02 MED ORDER — SODIUM CHLORIDE 0.9 % IV BOLUS: 1000.0000 mL | Freq: Once | INTRAVENOUS | Status: DC

## 2020-02-02 MED ORDER — ONDANSETRON HCL 4 MG/2ML IJ SOLN: 4.0000 mg | Freq: Once | INTRAMUSCULAR | Status: DC

## 2020-02-02 NOTE — Telephone Encounter (Signed)
Patient cancelled ultrasound for Thursday because she is sick. Would like a call to reschedule.

## 2020-02-02 NOTE — Discharge Instructions (Signed)
10 Things You Can Do to Manage Your COVID-19 Symptoms at Home If you have possible or confirmed COVID-19: 1. Stay home from work and school. And stay away from other public places. If you must go out, avoid using any kind of public transportation, ridesharing, or taxis. 2. Monitor your symptoms carefully. If your symptoms get worse, call your healthcare provider immediately. 3. Get rest and stay hydrated. 4. If you have a medical appointment, call the healthcare provider ahead of time and tell them that you have or may have COVID-19. 5. For medical emergencies, call 911 and notify the dispatch personnel that you have or may have COVID-19. 6. Cover your cough and sneezes with a tissue or use the inside of your elbow. 7. Wash your hands often with soap and water for at least 20 seconds or clean your hands with an alcohol-based hand sanitizer that contains at least 60% alcohol. 8. As much as possible, stay in a specific room and away from other people in your home. Also, you should use a separate bathroom, if available. If you need to be around other people in or outside of the home, wear a mask. 9. Avoid sharing personal items with other people in your household, like dishes, towels, and bedding. 10. Clean all surfaces that are touched often, like counters, tabletops, and doorknobs. Use household cleaning sprays or wipes according to the label instructions. cdc.gov/coronavirus 09/04/2018 This information is not intended to replace advice given to you by your health care provider. Make sure you discuss any questions you have with your health care provider. Document Revised: 02/06/2019 Document Reviewed: 02/06/2019 Elsevier Patient Education  2020 Elsevier Inc.  What types of side effects do monoclonal antibody drugs cause?  Common side effects  In general, the more common side effects caused by monoclonal antibody drugs include: . Allergic reactions, such as hives or itching . Flu-like signs and  symptoms, including chills, fatigue, fever, and muscle aches and pains . Nausea, vomiting . Diarrhea . Skin rashes . Low blood pressure   The CDC is recommending patients who receive monoclonal antibody treatments wait at least 90 days before being vaccinated.  Currently, there are no data on the safety and efficacy of mRNA COVID-19 vaccines in persons who received monoclonal antibodies or convalescent plasma as part of COVID-19 treatment. Based on the estimated half-life of such therapies as well as evidence suggesting that reinfection is uncommon in the 90 days after initial infection, vaccination should be deferred for at least 90 days, as a precautionary measure until additional information becomes available, to avoid interference of the antibody treatment with vaccine-induced immune responses.  If you have any questions or concerns after the infusion please call the Advanced Practice Provider on call at [phone]. This number is only intended for your use regarding questions or concerns about the infusion post-treatment side-effects.  Please do not provide this number to others for use.   If someone you know is interested in receiving treatment please have them call the COVID hotline at [phone].   

## 2020-02-02 NOTE — Progress Notes (Addendum)
Patient reviewed Fact Sheet for Patients, Parents, and Caregivers for Emergency Use Authorization (EUA) of Sotrovimab for the Treatment of Coronavirus. Patient also reviewed and is agreeable to the estimated cost of treatment. Patient is agreeable to proceed.   

## 2020-02-02 NOTE — Telephone Encounter (Signed)
Spoke with pt. Pt states needing to reschedule PUS due to +Covid on 01/31/20. Pt states is having Covid antibody infusion today. Pt rescheduled PUS on 02/19/20 at 830 am and consult to follow with Dr Quincy Simmonds. Pt agreeable to date and time of appt. Cancellation policy reviewed. Pt aware of PR.  Routing to Dr Quincy Simmonds for update Encounter closed

## 2020-02-02 NOTE — Progress Notes (Signed)
I connected by phone with Regina Schultz on 02/02/2020 at 9:20 AM to discuss the potential use of a new treatment for mild to moderate COVID-19 viral infection in non-hospitalized patients.  This patient is a 54 y.o. female that meets the FDA criteria for Emergency Use Authorization of COVID monoclonal antibody casirivimab/imdevimab, bamlanivimab/eteseviamb, or sotrovimab.  Has a (+) direct SARS-CoV-2 viral test result  Has mild or moderate COVID-19   Is NOT hospitalized due to COVID-19  Is within 10 days of symptom onset  Has at least one of the high risk factor(s) for progression to severe COVID-19 and/or hospitalization as defined in EUA.  Specific high risk criteria : Cardiovascular disease or hypertension   Symptoms of H/A, congestion, fever, diarrhea, loss of taste began 01/31/20.   I have spoken and communicated the following to the patient or parent/caregiver regarding COVID monoclonal antibody treatment:  1. FDA has authorized the emergency use for the treatment of mild to moderate COVID-19 in adults and pediatric patients with positive results of direct SARS-CoV-2 viral testing who are 70 years of age and older weighing at least 40 kg, and who are at high risk for progressing to severe COVID-19 and/or hospitalization.  2. The significant known and potential risks and benefits of COVID monoclonal antibody, and the extent to which such potential risks and benefits are unknown.  3. Information on available alternative treatments and the risks and benefits of those alternatives, including clinical trials.  4. Patients treated with COVID monoclonal antibody should continue to self-isolate and use infection control measures (e.g., wear mask, isolate, social distance, avoid sharing personal items, clean and disinfect high touch surfaces, and frequent handwashing) according to CDC guidelines.   5. The patient or parent/caregiver has the option to accept or refuse COVID monoclonal  antibody treatment.  After reviewing this information with the patient, the patient has agreed to receive one of the available covid 19 monoclonal antibodies and will be provided an appropriate fact sheet prior to infusion. Regina Gowda, NP 02/02/2020 9:20 AM
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
## 2020-02-03 ENCOUNTER — Encounter: Payer: Self-pay | Admitting: Obstetrics and Gynecology

## 2020-02-05 ENCOUNTER — Other Ambulatory Visit: Payer: Self-pay | Admitting: Obstetrics and Gynecology

## 2020-02-05 ENCOUNTER — Other Ambulatory Visit: Payer: Self-pay

## 2020-02-13 ENCOUNTER — Telehealth: Payer: Self-pay | Admitting: Radiology

## 2020-02-13 NOTE — Telephone Encounter (Addendum)
IBCSG 25-02: Phase III Trial Evaluating Role fo Exemestane Plus GnRH Analogue as AdjuvantTherapy for Premenopausal Women with Endocrine Responsive Breast Cancer  02/13/20      1:30PM  PHONE CALL: Called patient to do yearly follow-up for visit listed above. Patient's V/M box was full. Will attempt to call again on Monday.   02/16/20   1:38PM: LVM for patient to return call for follow-up visit.   02/24/20    3:27PM: Spoke with [name] and verified patient is having no on-going issues or no new developments since previous phone call. Thanked patient for her time.   Carol Ada, RT(R)(T) Clinical Research Coordinator

## 2020-02-17 ENCOUNTER — Telehealth: Payer: Self-pay

## 2020-02-17 NOTE — Telephone Encounter (Signed)
Medical record request completed and faxed medical records payment request to [facility] at [phone]. Mailed requested records [place] [id number].

## 2020-02-19 ENCOUNTER — Other Ambulatory Visit: Payer: Self-pay

## 2020-02-19 ENCOUNTER — Ambulatory Visit (INDEPENDENT_AMBULATORY_CARE_PROVIDER_SITE_OTHER): Payer: Managed Care, Other (non HMO)

## 2020-02-19 ENCOUNTER — Ambulatory Visit: Payer: Managed Care, Other (non HMO) | Admitting: Obstetrics and Gynecology

## 2020-02-19 ENCOUNTER — Encounter: Payer: Self-pay | Admitting: Obstetrics and Gynecology

## 2020-02-19 VITALS — BP 128/70 | HR 72 | Resp 16 | Ht 63.0 in | Wt 130.0 lb

## 2020-02-19 DIAGNOSIS — R970 Elevated carcinoembryonic antigen [CEA]: Secondary | ICD-10-CM | POA: Diagnosis not present

## 2020-02-19 NOTE — Patient Instructions (Signed)
CEA Test Why am I having this test? The CEA test is performed when a person has been diagnosed with certain cancers. It is used to monitor the person's cancer before, during, and after treatment. The CEA test can be used to monitor cancers of the following areas: colon, breast, lungs, ovaries, pancreas, stomach, bladder, thyroid, and liver. The CEA test is not used to screen for cancer because certain noncancerous conditions can also increase CEA. These conditions include breast, lung, and digestive system diseases. What is being tested? This test measures the level of the carcinoembryonic antigen (CEA) in the blood. CEA is a protein that is elevated in your blood if you have some types of cancer. This protein is normally found in an unborn baby (fetus), but it decreases significantly by the time the baby is born. Adults typically have a very low level of CEA. What kind of sample is taken?  A blood sample is required for this test. It is usually collected by inserting a needle into a blood vessel. How do I prepare for this test? Let your health care provider know if you use any tobacco products, including cigarettes, chewing tobacco, or e-cigarettes. Tobacco can make your CEA level go up. Your health care provider may ask you to stop using tobacco for a few days before the test. If you need help quitting, ask your health care provider. How are the results reported? Your test results will be reported as a value. Your health care provider will compare your results to normal ranges that were established after testing a large group of people (reference ranges). Reference ranges may vary among labs and hospitals. For this test, common reference ranges are:  0-2.5 mcg/L.  For tobacco users: 0-5 mcg/L. What do the results mean? CEA levels above 2.5 mcg/L are abnormal in people who do not use tobacco. CEA levels above 5 mcg/L are abnormal in people who use tobacco. The higher the level, the more  significant the result. If you have cancer, a high CEA level may mean that your cancer:  Is advanced.  Is not responding to treatment.  Has come back after treatment. Other conditions may also cause a high CEA level. These include:  Heavy tobacco use.  Liver disease.  Certain bowel diseases.  Inflammation of your pancreas.  Lung disease or infection.  Stomach ulcer.  Breast disease that is not cancer.  Ovarian disease that is not cancer. Talk with your health care provider about what your results mean. Questions to ask your health care provider Ask your health care provider, or the department that is doing the test:  When will my results be ready?  How will I get my results?  What are my treatment options?  What other tests do I need?  What are my next steps? Summary  The CEA test is performed to monitor a person before, during, and after treatment for certain cancers, such as cancer of the colon, breast, ovaries, or lungs.  Before the test, let your health care provider know if you use any tobacco products. You may be asked to stop using tobacco for a few days before the test.  Levels above the normal reference range may indicate the presence of cancer, the return of a cancer that was previously treated, or the presence of other noncancerous conditions.  Talk with your health care provider about what your results mean. This information is not intended to replace advice given to you by your health care provider. Make sure you  discuss any questions you have with your health care provider. Document Revised: 02/02/2017 Document Reviewed: 10/30/2016 Elsevier Patient Education  2020 [employer].
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
## 2020-02-24 ENCOUNTER — Encounter: Payer: Self-pay | Admitting: Radiology

## 2020-02-24 DIAGNOSIS — Z170 Estrogen receptor positive status [ER+]: Secondary | ICD-10-CM

## 2020-02-24 NOTE — Research (Signed)
Research - [facility] IBCSG 25-02 (TEXT) Month 192 visit   02/24/20   3:27PM  Spoke with patient today. Telephone interview conducted regarding the following:  Bone densitometry: No DEXA scan performed in the past year.  Performance status: Patient reports that she is fully active with no restrictions (KPS = 100%)  Menstrual status: Patients status is post-menopausal as of February 2018. Information regarding gynecologic proceduresover the pastyearwill beobtainedfrom her shared gynecologic records.  Adverse Events: Patient has not developed any serious health problem over the past year.She denies any significant cardiovascular or cerebrovascular events. She denies any bone fractures over the past year. There have been no severe (> grade 3) adverse events during that time.  Malignancies: Patient denies the development of any new cancers. 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  Additional treatment: She has not taken any additional endocrine therapy or bisphosphonates for prevention of breast cancer recurrence.   Patient will continue to receiveher carefromher gynecologist for annual assessments. Patient is aware that she will be contacted by the research nurse by phone for any additional information that is needed on an annual basis. Thanked patient for her participation in this study.  Carol Ada, RT(R)(T) Clinical Research Coordinator

## 2020-03-01 ENCOUNTER — Telehealth: Payer: Self-pay | Admitting: *Deleted

## 2020-03-01 NOTE — Telephone Encounter (Signed)
To patient, left detailed message, name identified on voicemail, ok per dpr.  Advised of normal pap results dated 01/05/20.  Return call to office if any additional questions.  Copy of [facility] pap report to Dr. Edward Jolly to sign for scan.   Routing to provider for final review. Patient is agreeable to disposition. Will close encounter.

## 2020-03-23 ENCOUNTER — Ambulatory Visit: Payer: Managed Care, Other (non HMO) | Admitting: Hospice and Palliative Medicine

## 2020-03-27 ENCOUNTER — Other Ambulatory Visit: Payer: Self-pay | Admitting: Internal Medicine

## 2020-03-27 DIAGNOSIS — F411 Generalized anxiety disorder: Secondary | ICD-10-CM

## 2020-06-16 ENCOUNTER — Other Ambulatory Visit: Payer: Self-pay | Admitting: Internal Medicine

## 2020-06-16 DIAGNOSIS — F411 Generalized anxiety disorder: Secondary | ICD-10-CM

## 2020-07-04 ENCOUNTER — Other Ambulatory Visit: Payer: Self-pay | Admitting: Internal Medicine

## 2020-07-04 DIAGNOSIS — F411 Generalized anxiety disorder: Secondary | ICD-10-CM

## 2020-07-05 ENCOUNTER — Other Ambulatory Visit: Payer: Self-pay

## 2020-07-05 DIAGNOSIS — F411 Generalized anxiety disorder: Secondary | ICD-10-CM

## 2020-07-05 MED ORDER — BUPROPION HCL ER (SR) 150 MG PO TB12: ORAL_TABLET | ORAL | 0 refills | Status: DC

## 2020-07-09 ENCOUNTER — Encounter: Payer: Managed Care, Other (non HMO) | Admitting: Physician Assistant

## 2020-07-11 ENCOUNTER — Other Ambulatory Visit: Payer: Self-pay | Admitting: Internal Medicine

## 2020-07-11 DIAGNOSIS — F411 Generalized anxiety disorder: Secondary | ICD-10-CM

## 2020-07-20 ENCOUNTER — Telehealth: Payer: Self-pay | Admitting: Nurse Practitioner

## 2020-07-20 NOTE — Telephone Encounter (Signed)
Lvm letting patient know when she arrives for her 07/21/20 appt, she needs to remain in car and call office to let us know she is here so we can perform covid test prior to her entering the office-Toni

## 2020-07-21 ENCOUNTER — Other Ambulatory Visit: Payer: Self-pay

## 2020-07-21 ENCOUNTER — Encounter: Payer: Self-pay | Admitting: Nurse Practitioner

## 2020-07-21 ENCOUNTER — Ambulatory Visit (INDEPENDENT_AMBULATORY_CARE_PROVIDER_SITE_OTHER): Payer: Managed Care, Other (non HMO) | Admitting: Nurse Practitioner

## 2020-07-21 VITALS — BP 132/90 | HR 70 | Temp 97.5°F | Resp 16 | Ht 64.0 in | Wt 128.4 lb

## 2020-07-21 DIAGNOSIS — J019 Acute sinusitis, unspecified: Secondary | ICD-10-CM

## 2020-07-21 DIAGNOSIS — R6889 Other general symptoms and signs: Secondary | ICD-10-CM

## 2020-07-21 DIAGNOSIS — Z1152 Encounter for screening for COVID-19: Secondary | ICD-10-CM

## 2020-07-21 DIAGNOSIS — F411 Generalized anxiety disorder: Secondary | ICD-10-CM

## 2020-07-21 LAB — POC SOFIA 2 FLU + SARS ANTIGEN FIA
Influenza A, POC: NEGATIVE
Influenza B, POC: NEGATIVE
SARS Coronavirus 2 Ag: NEGATIVE

## 2020-07-21 MED ORDER — AMOXICILLIN-POT CLAVULANATE 875-125 MG PO TABS: 1.0000 | ORAL_TABLET | Freq: Two times a day (BID) | ORAL | 0 refills | Status: DC
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
## 2020-07-22 ENCOUNTER — Other Ambulatory Visit: Payer: Self-pay | Admitting: Internal Medicine

## 2020-07-22 DIAGNOSIS — F411 Generalized anxiety disorder: Secondary | ICD-10-CM

## 2020-07-22 MED ORDER — ESCITALOPRAM OXALATE 20 MG PO TABS: ORAL_TABLET | ORAL | 2 refills | Status: DC

## 2020-07-22 MED ORDER — BUPROPION HCL ER (SR) 150 MG PO TB12: ORAL_TABLET | ORAL | 2 refills | Status: DC

## 2020-08-27 ENCOUNTER — Telehealth: Payer: Self-pay

## 2020-08-27 NOTE — Telephone Encounter (Signed)
pt called requesting another antibiotic.  was last seen 07/21/20 and feels she still has a sinus infection.  coughing up green mucus and her right eye is swollen underneath.  she did say she tested yesterday for covid and it was negative.  pt is out of town in [place] and I advised it had been a month since she has been seen and they may advise for her to go to urgent care.  she asked if we did send something in if it can be sent to [address] [place].  I spoke to [facility] and she advised that pt would need to go to an urgent care where she is at due needing an appt since it has been since 07/21/20 when she was last seen and would have to be re-evaluated

## 2020-09-29 ENCOUNTER — Other Ambulatory Visit: Payer: Self-pay

## 2020-09-29 ENCOUNTER — Encounter: Payer: Self-pay | Admitting: Nurse Practitioner

## 2020-09-29 ENCOUNTER — Ambulatory Visit (INDEPENDENT_AMBULATORY_CARE_PROVIDER_SITE_OTHER): Payer: Managed Care, Other (non HMO) | Admitting: Nurse Practitioner

## 2020-09-29 VITALS — BP 128/82 | HR 60 | Temp 98.0°F | Resp 16 | Ht 60.0 in | Wt 129.2 lb

## 2020-09-29 DIAGNOSIS — Z8679 Personal history of other diseases of the circulatory system: Secondary | ICD-10-CM | POA: Diagnosis not present

## 2020-09-29 DIAGNOSIS — Z0189 Encounter for other specified special examinations: Secondary | ICD-10-CM | POA: Diagnosis not present

## 2020-09-29 DIAGNOSIS — Z0001 Encounter for general adult medical examination with abnormal findings: Secondary | ICD-10-CM

## 2020-09-29 DIAGNOSIS — R7301 Impaired fasting glucose: Secondary | ICD-10-CM | POA: Diagnosis not present

## 2020-09-29 DIAGNOSIS — E782 Mixed hyperlipidemia: Secondary | ICD-10-CM

## 2020-09-29 DIAGNOSIS — R011 Cardiac murmur, unspecified: Secondary | ICD-10-CM

## 2020-09-29 DIAGNOSIS — M85859 Other specified disorders of bone density and structure, unspecified thigh: Secondary | ICD-10-CM

## 2020-09-29 DIAGNOSIS — I10 Essential (primary) hypertension: Secondary | ICD-10-CM | POA: Diagnosis not present

## 2020-09-29 DIAGNOSIS — F411 Generalized anxiety disorder: Secondary | ICD-10-CM

## 2020-09-29 DIAGNOSIS — R300 Dysuria: Secondary | ICD-10-CM

## 2020-09-29 LAB — POCT GLYCOSYLATED HEMOGLOBIN (HGB A1C): Hemoglobin A1C: 4.9 % (ref 4.0–5.6)
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
## 2020-09-30 LAB — UA/M W/RFLX CULTURE, ROUTINE
Bilirubin, UA: NEGATIVE
Glucose, UA: NEGATIVE
Ketones, UA: NEGATIVE
Leukocytes,UA: NEGATIVE
Nitrite, UA: NEGATIVE
Protein,UA: NEGATIVE
RBC, UA: NEGATIVE
Specific Gravity, UA: 1.007 (ref 1.005–1.030)
Urobilinogen, Ur: 0.2 mg/dL (ref 0.2–1.0)
pH, UA: 7.5 (ref 5.0–7.5)

## 2020-09-30 LAB — MICROSCOPIC EXAMINATION
Bacteria, UA: NONE SEEN
Casts: NONE SEEN /lpf
RBC, Urine: NONE SEEN /hpf (ref 0–2)
WBC, UA: NONE SEEN /hpf (ref 0–5)

## 2020-10-01 LAB — CMP14+EGFR
ALT: 17 IU/L (ref 0–32)
AST: 19 IU/L (ref 0–40)
Albumin/Globulin Ratio: 1.8 (ref 1.2–2.2)
Albumin: 4.4 g/dL (ref 3.8–4.9)
Alkaline Phosphatase: 104 IU/L (ref 44–121)
BUN/Creatinine Ratio: 11 (ref 9–23)
BUN: 8 mg/dL (ref 6–24)
Bilirubin Total: 0.5 mg/dL (ref 0.0–1.2)
CO2: 26 mmol/L (ref 20–29)
Calcium: 9.8 mg/dL (ref 8.7–10.2)
Chloride: 100 mmol/L (ref 96–106)
Creatinine, Ser: 0.76 mg/dL (ref 0.57–1.00)
Globulin, Total: 2.5 g/dL (ref 1.5–4.5)
Glucose: 100 mg/dL — ABNORMAL HIGH (ref 65–99)
Potassium: 4.6 mmol/L (ref 3.5–5.2)
Sodium: 139 mmol/L (ref 134–144)
Total Protein: 6.9 g/dL (ref 6.0–8.5)
eGFR: 94 mL/min/{1.73_m2} (ref >59)

## 2020-10-01 LAB — LIPID PANEL
Chol/HDL Ratio: 3 ratio (ref 0.0–4.4)
Cholesterol, Total: 223 mg/dL — ABNORMAL HIGH (ref 100–199)
HDL: 75 mg/dL (ref >39)
LDL Chol Calc (NIH): 139 mg/dL — ABNORMAL HIGH (ref 0–99)
Triglycerides: 51 mg/dL (ref 0–149)
VLDL Cholesterol Cal: 9 mg/dL (ref 5–40)

## 2020-10-01 LAB — B12 AND FOLATE PANEL
Folate: 11.9 ng/mL (ref >3.0)
Vitamin B-12: 732 pg/mL (ref 232–1245)

## 2020-10-01 LAB — CBC WITH DIFFERENTIAL/PLATELET
Basophils Absolute: 0.1 10*3/uL (ref 0.0–0.2)
Basos: 1 %
EOS (ABSOLUTE): 0.1 10*3/uL (ref 0.0–0.4)
Eos: 2 %
Hematocrit: 42.3 % (ref 34.0–46.6)
Hemoglobin: 14.5 g/dL (ref 11.1–15.9)
Immature Grans (Abs): 0 10*3/uL (ref 0.0–0.1)
Immature Granulocytes: 0 %
Lymphocytes Absolute: 2 10*3/uL (ref 0.7–3.1)
Lymphs: 35 %
MCH: 31 pg (ref 26.6–33.0)
MCHC: 34.3 g/dL (ref 31.5–35.7)
MCV: 90 fL (ref 79–97)
Monocytes Absolute: 0.5 10*3/uL (ref 0.1–0.9)
Monocytes: 9 %
Neutrophils Absolute: 3.1 10*3/uL (ref 1.4–7.0)
Neutrophils: 53 %
Platelets: 325 10*3/uL (ref 150–450)
RBC: 4.68 x10E6/uL (ref 3.77–5.28)
RDW: 11.3 % — ABNORMAL LOW (ref 11.7–15.4)
WBC: 5.8 10*3/uL (ref 3.4–10.8)

## 2020-10-01 LAB — TSH+FREE T4
Free T4: 1.1 ng/dL (ref 0.82–1.77)
TSH: 1.33 u[IU]/mL (ref 0.450–4.500)

## 2020-10-01 LAB — VITAMIN D 25 HYDROXY (VIT D DEFICIENCY, FRACTURES): Vit D, 25-Hydroxy: 66.4 ng/mL (ref 30.0–100.0)

## 2020-10-13 ENCOUNTER — Ambulatory Visit: Payer: Managed Care, Other (non HMO)

## 2020-10-13 ENCOUNTER — Other Ambulatory Visit: Payer: Self-pay

## 2020-10-13 DIAGNOSIS — Z8679 Personal history of other diseases of the circulatory system: Secondary | ICD-10-CM

## 2020-10-13 DIAGNOSIS — I6523 Occlusion and stenosis of bilateral carotid arteries: Secondary | ICD-10-CM | POA: Diagnosis not present

## 2020-10-14 ENCOUNTER — Telehealth: Payer: Self-pay

## 2020-10-14 ENCOUNTER — Other Ambulatory Visit: Payer: Self-pay | Admitting: Nurse Practitioner

## 2020-10-14 DIAGNOSIS — F411 Generalized anxiety disorder: Secondary | ICD-10-CM

## 2020-10-14 NOTE — Telephone Encounter (Signed)
Called and left message for her to call [facility] to schedule her bone density scan.LNB

## 2020-10-20 ENCOUNTER — Other Ambulatory Visit: Payer: Managed Care, Other (non HMO)

## 2020-10-26 NOTE — Procedures (Signed)
[place], [place] [address]  DATE OF SERVICE: October 13, 2020  CAROTID DOPPLER INTERPRETATION:  Bilateral Carotid Ultrsasound and Color Doppler Examination was performed. The RIGHT CCA shows no significant plaque in the vessel. The LEFT CCA shows no significant plaque in the vessel. There was no intimal thickening noted in the RIGHT carotid artery. There was no intimal thickening in the LEFT carotid artery.  The RIGHT CCA shows peak systolic velocity of 66 cm per second. The end diastolic velocity is 24 cm per second on the RIGHT side. The RIGHT ICA shows peak systolic velocity of 83 per second. RIGHT sided ICA end diastolic velocity is 37 cm per second. The RIGHT ECA shows a peak systolic velocity of 77 cm per second. The ICA/CCA ratio is calculated to be 1.25. This suggests less than 50% stenosis. The Vertebral Artery shows antegrade flow.  The LEFT CCA shows peak systolic velocity of 65 cm per second. The end diastolic velocity is 23 cm per second on the LEFT side. The LEFT ICA shows peak systolic velocity of 123456 per second. LEFT sided ICA end diastolic velocity is 44 cm per second. The LEFT ECA shows a peak systolic velocity of 67 cm per second. The ICA/CCA ratio is calculated to be 1.8. This suggests less than 50% stenosis. The Vertebral Artery shows antegrade flow.   Impression:    The RIGHT CAROTID shows less than 50% stenosis. The LEFT CAROTID shows less than 50% stenosis.  There is no significant plaque formation noted on the LEFT and no significant plaque on the RIGHT  side. Consider a repeat Carotid doppler if clinical situation and symptoms warrant in 6-12 months. Patient should be encouraged to change lifestyles such as smoking cessation, regular exercise and dietary modification. Use of statins in the right clinical setting and ASA is encouraged.  Allyne Gee, MD [facility] Pulmonary Critical Care Medicine

## 2020-10-27 ENCOUNTER — Other Ambulatory Visit: Payer: Managed Care, Other (non HMO)

## 2020-11-10 ENCOUNTER — Other Ambulatory Visit: Payer: Managed Care, Other (non HMO)

## 2020-11-18 ENCOUNTER — Other Ambulatory Visit: Payer: Self-pay | Admitting: Nurse Practitioner

## 2020-11-18 DIAGNOSIS — F411 Generalized anxiety disorder: Secondary | ICD-10-CM

## 2020-11-23 ENCOUNTER — Ambulatory Visit: Payer: Managed Care, Other (non HMO) | Admitting: Nurse Practitioner

## 2020-11-24 ENCOUNTER — Other Ambulatory Visit: Payer: Self-pay

## 2020-11-24 ENCOUNTER — Ambulatory Visit: Payer: Managed Care, Other (non HMO)

## 2020-11-24 DIAGNOSIS — R011 Cardiac murmur, unspecified: Secondary | ICD-10-CM | POA: Diagnosis not present

## 2020-12-02 ENCOUNTER — Ambulatory Visit: Payer: Managed Care, Other (non HMO) | Admitting: Nurse Practitioner

## 2020-12-08 ENCOUNTER — Other Ambulatory Visit: Payer: Self-pay | Admitting: Nurse Practitioner

## 2020-12-08 DIAGNOSIS — F411 Generalized anxiety disorder: Secondary | ICD-10-CM

## 2020-12-13 ENCOUNTER — Ambulatory Visit: Payer: Managed Care, Other (non HMO) | Admitting: Nurse Practitioner

## 2020-12-14 ENCOUNTER — Ambulatory Visit: Payer: Managed Care, Other (non HMO) | Admitting: Nurse Practitioner

## 2020-12-15 ENCOUNTER — Encounter: Payer: Self-pay | Admitting: Cardiovascular Disease

## 2020-12-15 ENCOUNTER — Other Ambulatory Visit: Payer: Self-pay

## 2020-12-15 ENCOUNTER — Ambulatory Visit: Payer: Managed Care, Other (non HMO) | Admitting: Cardiovascular Disease

## 2020-12-15 DIAGNOSIS — I5189 Other ill-defined heart diseases: Secondary | ICD-10-CM | POA: Diagnosis not present

## 2020-12-15 DIAGNOSIS — Z853 Personal history of malignant neoplasm of breast: Secondary | ICD-10-CM | POA: Diagnosis not present

## 2020-12-15 DIAGNOSIS — E7800 Pure hypercholesterolemia, unspecified: Secondary | ICD-10-CM

## 2020-12-15 DIAGNOSIS — Z8659 Personal history of other mental and behavioral disorders: Secondary | ICD-10-CM

## 2020-12-15 DIAGNOSIS — E785 Hyperlipidemia, unspecified: Secondary | ICD-10-CM

## 2020-12-15 DIAGNOSIS — R0789 Other chest pain: Secondary | ICD-10-CM | POA: Diagnosis not present
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
## 2020-12-15 NOTE — Patient Instructions (Signed)
Medication Instructions:  No Changes In Medications at this time.  *If you need a refill on your cardiac medications before your next appointment, please call your pharmacy*  Testing/Procedures: Dr. Claiborne Billings has ordered a CT coronary calcium score. This test is done at [address]. This is $99 out of pocket.   Coronary CalciumScan A coronary calcium scan is an imaging test used to look for deposits of calcium and other fatty materials (plaques) in the inner lining of the blood vessels of the heart (coronary arteries). These deposits of calcium and plaques can partly clog and narrow the coronary arteries without producing any symptoms or warning signs. This puts a person at risk for a heart attack. This test can detect these deposits before symptoms develop. Tell a health care provider about: Any allergies you have. All medicines you are taking, including vitamins, herbs, eye drops, creams, and over-the-counter medicines. Any problems you or family members have had with anesthetic medicines. Any blood disorders you have. Any surgeries you have had. Any medical conditions you have. Whether you are pregnant or may be pregnant. What are the risks? Generally, this is a safe procedure. However, problems may occur, including: Harm to a pregnant woman and her unborn baby. This test involves the use of radiation. Radiation exposure can be dangerous to a pregnant woman and her unborn baby. If you are pregnant, you generally should not have this procedure done. Slight increase in the risk of cancer. This is because of the radiation involved in the test. What happens before the procedure? No preparation is needed for this procedure. What happens during the procedure? You will undress and remove any jewelry around your neck or chest. You will put on a hospital gown. Sticky electrodes will be placed on your chest. The electrodes will be connected to an electrocardiogram (ECG) machine to  record a tracing of the electrical activity of your heart. A CT scanner will take pictures of your heart. During this time, you will be asked to lie still and hold your breath for 2-3 seconds while a picture of your heart is being taken. The procedure may vary among health care providers and hospitals. What happens after the procedure? You can get dressed. You can return to your normal activities. It is up to you to get the results of your test. Ask your health care provider, or the department that is doing the test, when your results will be ready. Summary A coronary calcium scan is an imaging test used to look for deposits of calcium and other fatty materials (plaques) in the inner lining of the blood vessels of the heart (coronary arteries). Generally, this is a safe procedure. Tell your health care provider if you are pregnant or may be pregnant. No preparation is needed for this procedure. A CT scanner will take pictures of your heart. You can return to your normal activities after the scan is done. This information is not intended to replace advice given to you by your health care provider. Make sure you discuss any questions you have with your health care provider. Document Released: 08/19/2007 Document Revised: 01/10/2016 Document Reviewed: 01/10/2016 Elsevier Interactive Patient Education  2017 [place]: At [facility], you and your health needs are our priority.  As part of our continuing mission to provide you with exceptional heart care, we have created designated Provider Care Teams.  These Care Teams include your primary Cardiologist (physician) and Advanced Practice Providers (APPs -  Physician  Assistants and Nurse Practitioners) who all work together to provide you with the care you need, when you need it.  Your next appointment:   1 year(s)  The format for your next appointment:   In Person  Provider:   Shelva Majestic, MD

## 2020-12-17 ENCOUNTER — Encounter: Payer: Self-pay | Admitting: Cardiovascular Disease

## 2020-12-23 ENCOUNTER — Ambulatory Visit: Payer: Managed Care, Other (non HMO) | Admitting: Nurse Practitioner

## 2020-12-29 ENCOUNTER — Other Ambulatory Visit: Payer: Self-pay | Admitting: Obstetrics and Gynecology

## 2020-12-29 NOTE — Telephone Encounter (Signed)
Last AEX 01/05/20. Not yet scheduled.   Message sent to appointment desk to call her to arrange AEX.

## 2021-01-05 ENCOUNTER — Other Ambulatory Visit: Payer: Self-pay | Admitting: Cardiovascular Disease

## 2021-01-05 ENCOUNTER — Other Ambulatory Visit: Payer: Self-pay

## 2021-01-05 ENCOUNTER — Ambulatory Visit (INDEPENDENT_AMBULATORY_CARE_PROVIDER_SITE_OTHER)
Admission: RE | Admit: 2021-01-05 | Discharge: 2021-01-05 | Disposition: A | Discharge status: Home / Self Care | Payer: Self-pay | Source: Ambulatory Visit | Attending: Cardiovascular Disease | Admitting: Cardiovascular Disease

## 2021-01-05 DIAGNOSIS — E7800 Pure hypercholesterolemia, unspecified: Secondary | ICD-10-CM

## 2021-01-10 ENCOUNTER — Other Ambulatory Visit: Payer: Self-pay

## 2021-01-10 ENCOUNTER — Ambulatory Visit
Admission: RE | Admit: 2021-01-10 | Discharge: 2021-01-10 | Disposition: A | Discharge status: Home / Self Care | Payer: Managed Care, Other (non HMO) | Source: Ambulatory Visit | Attending: Nurse Practitioner | Admitting: Nurse Practitioner

## 2021-01-10 DIAGNOSIS — M85859 Other specified disorders of bone density and structure, unspecified thigh: Secondary | ICD-10-CM | POA: Diagnosis present

## 2021-01-11 ENCOUNTER — Ambulatory Visit: Payer: Managed Care, Other (non HMO) | Admitting: Obstetrics and Gynecology

## 2021-01-24 ENCOUNTER — Other Ambulatory Visit: Payer: Self-pay

## 2021-01-24 DIAGNOSIS — R911 Solitary pulmonary nodule: Secondary | ICD-10-CM
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
## 2021-01-25 ENCOUNTER — Encounter: Payer: Self-pay | Admitting: Obstetrics and Gynecology

## 2021-01-25 ENCOUNTER — Other Ambulatory Visit: Payer: Self-pay | Admitting: Obstetrics and Gynecology

## 2021-01-25 ENCOUNTER — Ambulatory Visit (INDEPENDENT_AMBULATORY_CARE_PROVIDER_SITE_OTHER): Payer: Managed Care, Other (non HMO) | Admitting: Obstetrics and Gynecology

## 2021-01-25 ENCOUNTER — Other Ambulatory Visit: Payer: Self-pay

## 2021-01-25 VITALS — BP 140/88 | HR 70 | Resp 14 | Ht 63.5 in | Wt 132.5 lb

## 2021-01-25 DIAGNOSIS — Z01419 Encounter for gynecological examination (general) (routine) without abnormal findings: Secondary | ICD-10-CM

## 2021-01-25 DIAGNOSIS — Z124 Encounter for screening for malignant neoplasm of cervix: Secondary | ICD-10-CM

## 2021-01-25 DIAGNOSIS — Z23 Encounter for immunization: Secondary | ICD-10-CM

## 2021-01-25 MED ORDER — VALACYCLOVIR HCL 500 MG PO TABS: 500.0000 mg | ORAL_TABLET | Freq: Two times a day (BID) | ORAL | 2 refills | Status: DC

## 2021-01-25 NOTE — Patient Instructions (Signed)
EXERCISE AND DIET:  We recommended that you start or continue a regular exercise program for good health. Regular exercise means any activity that makes your heart beat faster and makes you sweat.  We recommend exercising at least 30 minutes per day at least 3 days a week, preferably 4 or 5.  We also recommend a diet low in fat and sugar.  Inactivity, poor dietary choices and obesity can cause diabetes, heart attack, stroke, and kidney damage, among others.    ALCOHOL AND SMOKING:  Women should limit their alcohol intake to no more than 7 drinks/beers/glasses of wine (combined, not each!) per week. Moderation of alcohol intake to this level decreases your risk of breast cancer and liver damage. And of course, no recreational drugs are part of a healthy lifestyle.  And absolutely no smoking or even second hand smoke. Most people know smoking can cause heart and lung diseases, but did you know it also contributes to weakening of your bones? Aging of your skin?  Yellowing of your teeth and nails?  CALCIUM AND VITAMIN D:  Adequate intake of calcium and Vitamin D are recommended.  The recommendations for exact amounts of these supplements seem to change often, but generally speaking 600 mg of calcium (either carbonate or citrate) and 800 units of Vitamin D per day seems prudent. Certain women may benefit from higher intake of Vitamin D.  If you are among these women, your doctor will have told you during your visit.    PAP SMEARS:  Pap smears, to check for cervical cancer or precancers,  have traditionally been done yearly, although recent scientific advances have shown that most women can have pap smears less often.  However, every woman still should have a physical exam from her gynecologist every year. It will include a breast check, inspection of the vulva and vagina to check for abnormal growths or skin changes, a visual exam of the cervix, and then an exam to evaluate the size and shape of the uterus and  ovaries.  And after 54 years of age, a rectal exam is indicated to check for rectal cancers. We will also provide age appropriate advice regarding health maintenance, like when you should have certain vaccines, screening for sexually transmitted diseases, bone density testing, colonoscopy, mammograms, etc.   MAMMOGRAMS:  All women over 52 years old should have a yearly mammogram. Many facilities now offer a "3D" mammogram, which may cost around $50 extra out of pocket. If possible,  we recommend you accept the option to have the 3D mammogram performed.  It both reduces the number of women who will be called back for extra views which then turn out to be normal, and it is better than the routine mammogram at detecting truly abnormal areas.    COLONOSCOPY:  Colonoscopy to screen for colon cancer is recommended for all women at age 18.  We know, you hate the idea of the prep.  We agree, BUT, having colon cancer and not knowing it is worse!!  Colon cancer so often starts as a polyp that can be seen and removed at colonscopy, which can quite literally save your life!  And if your first colonoscopy is normal and you have no family history of colon cancer, most women don't have to have it again for 10 years.  Once every ten years, you can do something that may end up saving your life, right?  We will be happy to help you get it scheduled when you are ready.  Be sure to check your insurance coverage so you understand how much it will cost.  It may be covered as a preventative service at no cost, but you should check your particular policy.    Osteoporosis Osteoporosis happens when the bones become thin and less dense than normal. Osteoporosis makes bones more brittle and fragile and more likely to break (fracture). Over time, osteoporosis can cause your bones to become so weak that they fracture after a minor fall. Bones in the hip, wrist, and spine are most likely to fracture due to osteoporosis. What are the  causes? The exact cause of this condition is not known. What increases the risk? You are more likely to develop this condition if you: Have family members with this condition. Have poor nutrition. Use the following: Steroid medicines, such as prednisone. Anti-seizure medicines. Nicotine or tobacco, such as cigarettes, e-cigarettes, and chewing tobacco. Are female. Are age 50 or older. Are not physically active (are sedentary). Are of European or Asian descent. Have a small body frame. What are the signs or symptoms? A fracture might be the first sign of osteoporosis, especially if the fracture results from a fall or injury that usually would not cause a bone to break. Other signs and symptoms include: Pain in the neck or low back. Stooped posture. Loss of height. How is this diagnosed? This condition may be diagnosed based on: Your medical history. A physical exam. A bone mineral density test, also called a DXA or DEXA test (dual-energy X-ray absorptiometry test). This test uses X-rays to measure the amount of minerals in your bones. How is this treated? This condition may be treated by: Making lifestyle changes, such as: Including foods with more calcium and vitamin D in your diet. Doing weight-bearing and muscle-strengthening exercises. Stopping tobacco use. Limiting alcohol intake. Taking medicine to slow the process of bone loss or to increase bone density. Taking daily supplements of calcium and vitamin D. Taking hormone replacement medicines, such as estrogen for women and testosterone for men. Monitoring your levels of calcium and vitamin D. The goal of treatment is to strengthen your bones and lower your risk for a fracture. Follow these instructions at home: Eating and drinking Include calcium and vitamin D in your diet. Calcium is important for bone health, and vitamin D helps your body absorb calcium. Good sources of calcium and vitamin D include: Certain fatty  fish, such as salmon and tuna. Products that have calcium and vitamin D added to them (are fortified), such as fortified cereals. Egg yolks. Cheese. Liver.  Activity Do exercises as told by your health care provider. Ask your health care provider what exercises and activities are safe for you. You should do: Exercises that make you work against gravity (weight-bearing exercises), such as tai chi, yoga, or walking. Exercises to strengthen muscles, such as lifting weights. Lifestyle Do not drink alcohol if: Your health care provider tells you not to drink. You are pregnant, may be pregnant, or are planning to become pregnant. If you drink alcohol: Limit how much you use to: 0-1 drink a day for women. 0-2 drinks a day for men. Know how much alcohol is in your drink. In the [place] one drink equals one 12 oz bottle of beer (355 mL), one 5 oz glass of wine (148 mL), or one 1 oz glass of hard liquor (44 mL). Do not use any products that contain nicotine or tobacco, such as cigarettes, e-cigarettes, and chewing tobacco. If you need help quitting, ask   your health care provider. Preventing falls Use devices to help you move around (mobility aids) as needed, such as canes, walkers, scooters, or crutches. Keep rooms well-lit and clutter-free. Remove tripping hazards from walkways, including cords and throw rugs. Install grab bars in bathrooms and safety rails on stairs. Use rubber mats in the bathroom and other areas that are often wet or slippery. Wear closed-toe shoes that fit well and support your feet. Wear shoes that have rubber soles or low heels. Review your medicines with your health care provider. Some medicines can cause dizziness or changes in blood pressure, which can increase your risk of falling. General instructions Take over-the-counter and prescription medicines only as told by your health care provider. Keep all follow-up visits. This is important. Contact a health care provider  if: You have never been screened for osteoporosis and you are: A woman who is age 26 or older. A man who is age 86 or older. Get help right away if: You fall or injure yourself. Summary Osteoporosis is thinning and loss of density in your bones. This makes bones more brittle and fragile and more likely to break (fracture),even with minor falls. The goal of treatment is to strengthen your bones and lower your risk for a fracture. Include calcium and vitamin D in your diet. Calcium is important for bone health, and vitamin D helps your body absorb calcium. Talk with your health care provider about screening for osteoporosis if you are a woman who is age 32 or older, or a man who is age 46 or older. This information is not intended to replace advice given to you by your health care provider. Make sure you discuss any questions you have with your health care provider. Document Revised: 08/07/2019 Document Reviewed: 08/07/2019 Elsevier Patient Education  [place].

## 2021-01-26 ENCOUNTER — Other Ambulatory Visit: Payer: Self-pay | Admitting: Nurse Practitioner

## 2021-01-26 DIAGNOSIS — F411 Generalized anxiety disorder: Secondary | ICD-10-CM

## 2021-01-29 LAB — IGP, APTIMA HPV: HPV Aptima: NEGATIVE

## 2021-02-09 ENCOUNTER — Telehealth: Payer: Self-pay

## 2021-02-09 NOTE — Telephone Encounter (Signed)
Patient was advised to keep appt as she has several no shows and cancelations. Patient understood and agreed.

## 2021-02-24 ENCOUNTER — Other Ambulatory Visit: Payer: Self-pay

## 2021-02-24 ENCOUNTER — Encounter: Payer: Self-pay | Admitting: Nurse Practitioner

## 2021-02-24 ENCOUNTER — Ambulatory Visit: Payer: Managed Care, Other (non HMO) | Admitting: Nurse Practitioner

## 2021-02-24 VITALS — BP 137/83 | HR 64 | Temp 98.6°F | Resp 16 | Ht 64.0 in | Wt 134.6 lb

## 2021-02-24 DIAGNOSIS — R011 Cardiac murmur, unspecified: Secondary | ICD-10-CM | POA: Diagnosis not present

## 2021-02-24 DIAGNOSIS — E782 Mixed hyperlipidemia: Secondary | ICD-10-CM | POA: Diagnosis not present

## 2021-02-24 DIAGNOSIS — M816 Localized osteoporosis [Lequesne]: Secondary | ICD-10-CM

## 2021-03-15 ENCOUNTER — Ambulatory Visit: Payer: Managed Care, Other (non HMO) | Admitting: Obstetrics and Gynecology
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
## 2021-03-25 ENCOUNTER — Other Ambulatory Visit: Payer: Self-pay | Admitting: Nurse Practitioner

## 2021-03-25 DIAGNOSIS — F411 Generalized anxiety disorder: Secondary | ICD-10-CM

## 2021-04-03 ENCOUNTER — Encounter: Payer: Self-pay | Admitting: Nurse Practitioner

## 2021-04-09 DIAGNOSIS — R0981 Nasal congestion: Secondary | ICD-10-CM | POA: Diagnosis not present

## 2021-04-09 DIAGNOSIS — H6502 Acute serous otitis media, left ear: Secondary | ICD-10-CM | POA: Diagnosis not present

## 2021-04-09 DIAGNOSIS — Z20822 Contact with and (suspected) exposure to covid-19: Secondary | ICD-10-CM | POA: Diagnosis not present

## 2021-04-21 DIAGNOSIS — L578 Other skin changes due to chronic exposure to nonionizing radiation: Secondary | ICD-10-CM | POA: Diagnosis not present

## 2021-04-21 DIAGNOSIS — D225 Melanocytic nevi of trunk: Secondary | ICD-10-CM | POA: Diagnosis not present

## 2021-04-21 DIAGNOSIS — D485 Neoplasm of uncertain behavior of skin: Secondary | ICD-10-CM | POA: Diagnosis not present

## 2021-04-21 DIAGNOSIS — D2271 Melanocytic nevi of right lower limb, including hip: Secondary | ICD-10-CM | POA: Diagnosis not present

## 2021-04-21 DIAGNOSIS — D2262 Melanocytic nevi of left upper limb, including shoulder: Secondary | ICD-10-CM | POA: Diagnosis not present

## 2021-04-21 DIAGNOSIS — L812 Freckles: Secondary | ICD-10-CM | POA: Diagnosis not present

## 2021-05-04 ENCOUNTER — Encounter (HOSPITAL_COMMUNITY): Payer: Self-pay | Admitting: Radiology

## 2021-05-17 ENCOUNTER — Telehealth: Payer: Self-pay | Admitting: Cardiovascular Disease

## 2021-05-17 NOTE — Telephone Encounter (Signed)
-  Pt made aware that she is due for chest CT. ?-Pt verbalized understanding ? ? ?Troy Sine, MD  ?01/23/2021  4:39 PM EST   ?  ?Calcium score 0.  On lung over read nonspecific postinfectious or inflammatory scarring in the medial segment of the right middle lobe.  Recommended to repeat noncontrast chest CT in 3 months to ensure stability and resolution  ? ? ?

## 2021-05-17 NOTE — Telephone Encounter (Signed)
Patients she calling in regards to the letter she received in the mail bout her CT. Please advise ?

## 2021-06-11 ENCOUNTER — Other Ambulatory Visit: Payer: Self-pay | Admitting: Nurse Practitioner

## 2021-06-11 DIAGNOSIS — F411 Generalized anxiety disorder: Secondary | ICD-10-CM

## 2021-06-13 DIAGNOSIS — D2362 Other benign neoplasm of skin of left upper limb, including shoulder: Secondary | ICD-10-CM | POA: Diagnosis not present

## 2021-06-13 DIAGNOSIS — L988 Other specified disorders of the skin and subcutaneous tissue: Secondary | ICD-10-CM | POA: Diagnosis not present

## 2021-07-06 ENCOUNTER — Other Ambulatory Visit: Payer: Self-pay | Admitting: Nurse Practitioner

## 2021-07-06 DIAGNOSIS — F411 Generalized anxiety disorder: Secondary | ICD-10-CM

## 2021-09-21 ENCOUNTER — Encounter: Payer: Managed Care, Other (non HMO) | Admitting: Nurse Practitioner

## 2021-09-23 ENCOUNTER — Other Ambulatory Visit: Payer: Self-pay | Admitting: Nurse Practitioner

## 2021-09-23 DIAGNOSIS — F411 Generalized anxiety disorder: Secondary | ICD-10-CM

## 2021-09-26 ENCOUNTER — Encounter: Payer: BC Managed Care – PPO | Admitting: Nurse Practitioner

## 2021-10-28 ENCOUNTER — Ambulatory Visit (INDEPENDENT_AMBULATORY_CARE_PROVIDER_SITE_OTHER): Payer: BC Managed Care – PPO | Admitting: Nurse Practitioner

## 2021-10-28 ENCOUNTER — Encounter: Payer: Self-pay | Admitting: Nurse Practitioner

## 2021-10-28 VITALS — BP 125/80 | HR 60 | Temp 98.3°F | Resp 16 | Ht 64.0 in | Wt 131.0 lb

## 2021-10-28 DIAGNOSIS — F411 Generalized anxiety disorder: Secondary | ICD-10-CM

## 2021-10-28 DIAGNOSIS — I10 Essential (primary) hypertension: Secondary | ICD-10-CM

## 2021-10-28 DIAGNOSIS — E538 Deficiency of other specified B group vitamins: Secondary | ICD-10-CM

## 2021-10-28 DIAGNOSIS — R300 Dysuria: Secondary | ICD-10-CM | POA: Diagnosis not present

## 2021-10-28 DIAGNOSIS — E559 Vitamin D deficiency, unspecified: Secondary | ICD-10-CM

## 2021-10-28 DIAGNOSIS — R911 Solitary pulmonary nodule: Secondary | ICD-10-CM | POA: Diagnosis not present

## 2021-10-28 DIAGNOSIS — M816 Localized osteoporosis [Lequesne]: Secondary | ICD-10-CM

## 2021-10-28 DIAGNOSIS — E782 Mixed hyperlipidemia: Secondary | ICD-10-CM

## 2021-10-28 DIAGNOSIS — Z0001 Encounter for general adult medical examination with abnormal findings: Secondary | ICD-10-CM | POA: Diagnosis not present

## 2021-10-28 DIAGNOSIS — R7301 Impaired fasting glucose: Secondary | ICD-10-CM | POA: Diagnosis not present

## 2021-10-28 MED ORDER — ESCITALOPRAM OXALATE 20 MG PO TABS: ORAL_TABLET | ORAL | 0 refills | Status: DC

## 2021-10-28 MED ORDER — BUPROPION HCL ER (SR) 150 MG PO TB12: ORAL_TABLET | ORAL | 1 refills | Status: DC
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
## 2021-10-29 LAB — UA/M W/RFLX CULTURE, ROUTINE
Bilirubin, UA: NEGATIVE
Glucose, UA: NEGATIVE
Ketones, UA: NEGATIVE
Leukocytes,UA: NEGATIVE
Nitrite, UA: NEGATIVE
Protein,UA: NEGATIVE
RBC, UA: NEGATIVE
Specific Gravity, UA: 1.009 (ref 1.005–1.030)
Urobilinogen, Ur: 0.2 mg/dL (ref 0.2–1.0)
pH, UA: 8.5 — ABNORMAL HIGH (ref 5.0–7.5)

## 2021-10-29 LAB — MICROSCOPIC EXAMINATION
Bacteria, UA: NONE SEEN
Casts: NONE SEEN /lpf

## 2021-10-31 DIAGNOSIS — D510 Vitamin B12 deficiency anemia due to intrinsic factor deficiency: Secondary | ICD-10-CM | POA: Diagnosis not present

## 2021-10-31 DIAGNOSIS — R300 Dysuria: Secondary | ICD-10-CM | POA: Diagnosis not present

## 2021-10-31 DIAGNOSIS — I10 Essential (primary) hypertension: Secondary | ICD-10-CM | POA: Diagnosis not present

## 2021-10-31 DIAGNOSIS — M816 Localized osteoporosis [Lequesne]: Secondary | ICD-10-CM | POA: Diagnosis not present

## 2021-10-31 DIAGNOSIS — R911 Solitary pulmonary nodule: Secondary | ICD-10-CM | POA: Diagnosis not present

## 2021-10-31 DIAGNOSIS — E782 Mixed hyperlipidemia: Secondary | ICD-10-CM | POA: Diagnosis not present

## 2021-10-31 DIAGNOSIS — R7301 Impaired fasting glucose: Secondary | ICD-10-CM | POA: Diagnosis not present

## 2021-11-01 LAB — CBC WITH DIFFERENTIAL/PLATELET
Basophils Absolute: 0.1 10*3/uL (ref 0.0–0.2)
Basos: 1 %
EOS (ABSOLUTE): 0.1 10*3/uL (ref 0.0–0.4)
Eos: 2 %
Hematocrit: 42.4 % (ref 34.0–46.6)
Hemoglobin: 14.3 g/dL (ref 11.1–15.9)
Immature Grans (Abs): 0 10*3/uL (ref 0.0–0.1)
Immature Granulocytes: 0 %
Lymphocytes Absolute: 1.9 10*3/uL (ref 0.7–3.1)
Lymphs: 34 %
MCH: 31.2 pg (ref 26.6–33.0)
MCHC: 33.7 g/dL (ref 31.5–35.7)
MCV: 92 fL (ref 79–97)
Monocytes Absolute: 0.6 10*3/uL (ref 0.1–0.9)
Monocytes: 10 %
Neutrophils Absolute: 2.9 10*3/uL (ref 1.4–7.0)
Neutrophils: 53 %
Platelets: 342 10*3/uL (ref 150–450)
RBC: 4.59 x10E6/uL (ref 3.77–5.28)
RDW: 11.4 % — ABNORMAL LOW (ref 11.7–15.4)
WBC: 5.6 10*3/uL (ref 3.4–10.8)

## 2021-11-01 LAB — LIPID PANEL
Chol/HDL Ratio: 2.8 ratio (ref 0.0–4.4)
Cholesterol, Total: 215 mg/dL — ABNORMAL HIGH (ref 100–199)
HDL: 76 mg/dL (ref >39)
LDL Chol Calc (NIH): 127 mg/dL — ABNORMAL HIGH (ref 0–99)
Triglycerides: 68 mg/dL (ref 0–149)
VLDL Cholesterol Cal: 12 mg/dL (ref 5–40)

## 2021-11-01 LAB — CMP14+EGFR
ALT: 14 IU/L (ref 0–32)
AST: 19 IU/L (ref 0–40)
Albumin/Globulin Ratio: 2 (ref 1.2–2.2)
Albumin: 4.9 g/dL (ref 3.8–4.9)
Alkaline Phosphatase: 95 IU/L (ref 44–121)
BUN/Creatinine Ratio: 14 (ref 9–23)
BUN: 11 mg/dL (ref 6–24)
Bilirubin Total: 0.7 mg/dL (ref 0.0–1.2)
CO2: 24 mmol/L (ref 20–29)
Calcium: 9.7 mg/dL (ref 8.7–10.2)
Chloride: 101 mmol/L (ref 96–106)
Creatinine, Ser: 0.79 mg/dL (ref 0.57–1.00)
Globulin, Total: 2.4 g/dL (ref 1.5–4.5)
Glucose: 98 mg/dL (ref 70–99)
Potassium: 4.9 mmol/L (ref 3.5–5.2)
Sodium: 141 mmol/L (ref 134–144)
Total Protein: 7.3 g/dL (ref 6.0–8.5)
eGFR: 89 mL/min/{1.73_m2} (ref >59)

## 2021-11-01 LAB — TSH+FREE T4
Free T4: 1.07 ng/dL (ref 0.82–1.77)
TSH: 1.22 u[IU]/mL (ref 0.450–4.500)

## 2021-11-01 LAB — B12 AND FOLATE PANEL
Folate: 11.3 ng/mL (ref >3.0)
Vitamin B-12: 852 pg/mL (ref 232–1245)

## 2021-11-01 LAB — HGB A1C W/O EAG: Hgb A1c MFr Bld: 5.1 % (ref 4.8–5.6)

## 2021-11-01 LAB — VITAMIN D 25 HYDROXY (VIT D DEFICIENCY, FRACTURES): Vit D, 25-Hydroxy: 93.2 ng/mL (ref 30.0–100.0)

## 2021-11-02 ENCOUNTER — Ambulatory Visit
Admission: RE | Admit: 2021-11-02 | Discharge: 2021-11-02 | Disposition: A | Discharge status: Home / Self Care | Payer: BC Managed Care – PPO | Source: Ambulatory Visit | Attending: Nurse Practitioner | Admitting: Nurse Practitioner

## 2021-11-02 DIAGNOSIS — R911 Solitary pulmonary nodule: Secondary | ICD-10-CM | POA: Insufficient documentation

## 2021-11-02 DIAGNOSIS — R918 Other nonspecific abnormal finding of lung field: Secondary | ICD-10-CM | POA: Diagnosis not present

## 2021-11-07 NOTE — Progress Notes (Signed)
Will discuss CT chest with patient at her office visit this week.

## 2021-11-09 ENCOUNTER — Encounter: Payer: Self-pay | Admitting: Nurse Practitioner

## 2021-11-09 ENCOUNTER — Ambulatory Visit (INDEPENDENT_AMBULATORY_CARE_PROVIDER_SITE_OTHER): Payer: BC Managed Care – PPO | Admitting: Nurse Practitioner

## 2021-11-09 VITALS — BP 136/86 | HR 61 | Temp 98.2°F | Resp 16 | Ht 64.0 in | Wt 131.6 lb

## 2021-11-09 DIAGNOSIS — R918 Other nonspecific abnormal finding of lung field: Secondary | ICD-10-CM
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
## 2021-11-10 ENCOUNTER — Ambulatory Visit: Payer: BC Managed Care – PPO | Admitting: Nurse Practitioner

## 2021-11-30 DIAGNOSIS — L578 Other skin changes due to chronic exposure to nonionizing radiation: Secondary | ICD-10-CM | POA: Diagnosis not present

## 2021-11-30 DIAGNOSIS — Z86018 Personal history of other benign neoplasm: Secondary | ICD-10-CM | POA: Diagnosis not present

## 2021-12-12 ENCOUNTER — Ambulatory Visit: Payer: Managed Care, Other (non HMO) | Admitting: Nurse Practitioner

## 2021-12-15 ENCOUNTER — Ambulatory Visit: Payer: BC Managed Care – PPO | Attending: Nurse Practitioner | Admitting: Nurse Practitioner

## 2021-12-15 ENCOUNTER — Encounter: Payer: Self-pay | Admitting: Nurse Practitioner

## 2021-12-15 VITALS — BP 118/82 | HR 63 | Ht 64.0 in | Wt 132.0 lb

## 2021-12-15 DIAGNOSIS — R911 Solitary pulmonary nodule: Secondary | ICD-10-CM | POA: Diagnosis not present

## 2021-12-15 DIAGNOSIS — E7800 Pure hypercholesterolemia, unspecified: Secondary | ICD-10-CM

## 2021-12-15 DIAGNOSIS — R011 Cardiac murmur, unspecified: Secondary | ICD-10-CM | POA: Diagnosis not present

## 2021-12-15 DIAGNOSIS — I10 Essential (primary) hypertension: Secondary | ICD-10-CM
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
## 2021-12-15 NOTE — Patient Instructions (Signed)
Medication Instructions:  Your physician recommends that you continue on your current medications as directed. Please refer to the Current Medication list given to you today.   *If you need a refill on your cardiac medications before your next appointment, please call your pharmacy*   Lab Work: NONE ordered at this time of appointment   If you have labs (blood work) drawn today and your tests are completely normal, you will receive your results only by: MyChart Message (if you have MyChart) OR A paper copy in the mail If you have any lab test that is abnormal or we need to change your treatment, we will call you to review the results.   Testing/Procedures: NONE ordered at this time of appointment     Follow-Up: At [facility], you and your health needs are our priority.  As part of our continuing mission to provide you with exceptional heart care, we have created designated Provider Care Teams.  These Care Teams include your primary Cardiologist (physician) and Advanced Practice Providers (APPs -  Physician Assistants and Nurse Practitioners) who all work together to provide you with the care you need, when you need it.  We recommend signing up for the patient portal called "MyChart".  Sign up information is provided on this After Visit Summary.  MyChart is used to connect with patients for Virtual Visits (Telemedicine).  Patients are able to view lab/test results, encounter notes, upcoming appointments, etc.  Non-urgent messages can be sent to your provider as well.   To learn more about what you can do with MyChart, go to [email or web address].    Your next appointment:   1 year(s)  The format for your next appointment:   In Person  Provider:   Thomas Kelly, MD     Other Instructions   Important Information About Sugar       

## 2021-12-19 ENCOUNTER — Other Ambulatory Visit: Payer: Self-pay | Admitting: Nurse Practitioner

## 2021-12-19 DIAGNOSIS — F411 Generalized anxiety disorder: Secondary | ICD-10-CM

## 2021-12-31 ENCOUNTER — Encounter: Payer: Self-pay | Admitting: Nurse Practitioner

## 2022-01-07 ENCOUNTER — Encounter: Payer: Self-pay | Admitting: Nurse Practitioner
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
## 2022-01-31 ENCOUNTER — Encounter: Payer: Self-pay | Admitting: Obstetrics and Gynecology

## 2022-01-31 ENCOUNTER — Ambulatory Visit (INDEPENDENT_AMBULATORY_CARE_PROVIDER_SITE_OTHER): Payer: Managed Care, Other (non HMO) | Admitting: Obstetrics and Gynecology

## 2022-01-31 VITALS — BP 135/81 | Ht 63.0 in | Wt 132.0 lb

## 2022-01-31 DIAGNOSIS — N898 Other specified noninflammatory disorders of vagina: Secondary | ICD-10-CM

## 2022-01-31 DIAGNOSIS — A5131 Condyloma latum: Secondary | ICD-10-CM

## 2022-01-31 DIAGNOSIS — Z9189 Other specified personal risk factors, not elsewhere classified: Secondary | ICD-10-CM

## 2022-01-31 DIAGNOSIS — Z124 Encounter for screening for malignant neoplasm of cervix: Secondary | ICD-10-CM | POA: Diagnosis not present

## 2022-01-31 DIAGNOSIS — B009 Herpesviral infection, unspecified: Secondary | ICD-10-CM

## 2022-01-31 DIAGNOSIS — M810 Age-related osteoporosis without current pathological fracture: Secondary | ICD-10-CM

## 2022-01-31 DIAGNOSIS — Z87410 Personal history of cervical dysplasia: Secondary | ICD-10-CM

## 2022-01-31 DIAGNOSIS — Z01419 Encounter for gynecological examination (general) (routine) without abnormal findings: Secondary | ICD-10-CM | POA: Diagnosis not present

## 2022-01-31 DIAGNOSIS — Z23 Encounter for immunization: Secondary | ICD-10-CM | POA: Diagnosis not present

## 2022-01-31 LAB — URINALYSIS, COMPLETE W/RFL CULTURE
Bacteria, UA: NONE SEEN /HPF
Bilirubin Urine: NEGATIVE
Glucose, UA: NEGATIVE
Hyaline Cast: NONE SEEN /LPF
Ketones, ur: NEGATIVE
Leukocyte Esterase: NEGATIVE
Nitrites, Initial: NEGATIVE
Protein, ur: NEGATIVE
RBC / HPF: NONE SEEN /HPF (ref 0–2)
Specific Gravity, Urine: 1.01 (ref 1.001–1.035)
WBC, UA: NONE SEEN /HPF (ref 0–5)
pH: 6.5 (ref 5.0–8.0)

## 2022-01-31 LAB — WET PREP FOR TRICH, YEAST, CLUE

## 2022-01-31 LAB — NO CULTURE INDICATED

## 2022-01-31 MED ORDER — METRONIDAZOLE 500 MG PO TABS: 500.0000 mg | ORAL_TABLET | Freq: Two times a day (BID) | ORAL | 0 refills | Status: DC

## 2022-01-31 MED ORDER — VALACYCLOVIR HCL 500 MG PO TABS: 500.0000 mg | ORAL_TABLET | Freq: Two times a day (BID) | ORAL | 2 refills | Status: DC
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
## 2022-01-31 NOTE — Patient Instructions (Signed)

## 2022-02-03 LAB — IGP, APTIMA HPV
HPV Aptima: NEGATIVE
PAP Smear Comment: 0

## 2022-02-08 ENCOUNTER — Other Ambulatory Visit (HOSPITAL_COMMUNITY)
Admission: RE | Admit: 2022-02-08 | Discharge: 2022-02-08 | Disposition: A | Discharge status: Home / Self Care | Payer: BC Managed Care – PPO | Source: Ambulatory Visit | Attending: Obstetrics and Gynecology | Admitting: Obstetrics and Gynecology

## 2022-02-08 ENCOUNTER — Telehealth: Payer: Self-pay | Admitting: Obstetrics and Gynecology

## 2022-02-08 ENCOUNTER — Encounter: Payer: Self-pay | Admitting: Obstetrics and Gynecology

## 2022-02-08 ENCOUNTER — Ambulatory Visit (INDEPENDENT_AMBULATORY_CARE_PROVIDER_SITE_OTHER): Payer: BC Managed Care – PPO | Admitting: Obstetrics and Gynecology

## 2022-02-08 VITALS — BP 121/80 | HR 60 | Ht 63.0 in | Wt 131.0 lb

## 2022-02-08 DIAGNOSIS — L821 Other seborrheic keratosis: Secondary | ICD-10-CM | POA: Diagnosis not present

## 2022-02-08 DIAGNOSIS — Z01812 Encounter for preprocedural laboratory examination: Secondary | ICD-10-CM

## 2022-02-08 DIAGNOSIS — A630 Anogenital (venereal) warts: Secondary | ICD-10-CM | POA: Insufficient documentation

## 2022-02-08 LAB — PREGNANCY, URINE: Preg Test, Ur: NEGATIVE

## 2022-02-08 NOTE — Patient Instructions (Signed)
Vulva Biopsy, Care After After a vulva biopsy, it is common to have: Slight bleeding from the biopsy site. Soreness or slight pain at the biopsy site. Follow these instructions at home: Your doctor may give you more instructions. If you have problems, contact your doctor. Biopsy site care  Follow instructions from your doctor about how to take care of your biopsy site. Make sure you: Clean the area using water and mild soap two times a day or as told by your doctor. Gently pat the area dry. You may shower 24 hours after the procedure. If you were prescribed an antibiotic ointment, apply it as told by your doctor. Do not stop using the antibiotic even if your condition gets better. If told by your doctor, take a warm water bath (sitz bath) to help with pain and soreness. A sitz bath is taken while you are sitting down. Do this as often as told by your doctor. The water should only come up to your hips and cover your butt. You may pat the area dry with a soft, clean towel. Leave stitches (sutures) or skin glue in place for at least 2 weeks. Leave tape strips alone unless you are told to take them off. You may trim the edges of the tape strips if they curl up. Check your biopsy area every day for signs of infection. It may be helpful to use a handheld mirror to do this. Check for: Redness, swelling, or more pain. More fluid or blood. Warmth. Pus or a bad smell. Do not rub the biopsy area after peeing (urinating). Gently pat the area dry, or use a bottle filled with warm water (peri bottle) to clean the area. Gently wipe from front to back. Lifestyle Wear loose, cotton underwear. Do not wear tight pants. For at least 1 week or until your doctor says it is okay: Do not use a tampon, douche, or put anything in your vagina. Do not have sex. Until your doctor says it is okay: Do not exercise. Do not swim or use a hot tub. General instructions Take over-the-counter and prescription  medicines only as told by your doctor. Drink enough fluid to keep your pee (urine) pale yellow. Use a sanitary pad until the bleeding stops. If told, put ice on the biopsy site. To do this: Place ice in a plastic bag. Place a towel between your skin and the bag. Leave the ice on for 20 minutes, 2-3 times a day. Take off the ice if your skin turns bright red. This is very important. If you cannot feel pain, heat, or cold, you have a greater risk of damage to the area. Keep all follow-up visits. Contact a doctor if: You have redness, swelling, or more pain around your biopsy site. You have more fluid or blood coming from your biopsy site. Your biopsy site feels warm when you touch it. Medicines or ice packs do not help with your pain. You have a fever or chills. Get help right away if: You have a lot of bleeding from the vulva. You have pus or a bad smell coming from your biopsy site. You have pain in your belly (abdomen). Summary After the procedure, it is common to have slight bleeding and soreness at the biopsy site. Follow all instructions as told by your doctor. Take sitz baths as told by your doctor. Leave any stitches in place. Check your biopsy site for infection. Signs include redness, swelling, more pain, more fluid or blood, or warmth. Get help   right away if you have a lot of bleeding, pus or a bad smell, or pain in your belly. This information is not intended to replace advice given to you by your health care provider. Make sure you discuss any questions you have with your health care provider. Document Revised: 11/09/2020 Document Reviewed: 11/09/2020 Elsevier Patient Education  2023 [employer]  

## 2022-02-08 NOTE — Telephone Encounter (Signed)
Please let patient know that I sent her biopsy specimen to [facility] today.  I hope this is ok for her!  I need a reminder from her at each visit to send any specimen to [facility], if this is her preference.  Otherwise, the labs will default to [facility] or [facility].

## 2022-02-10 NOTE — Telephone Encounter (Signed)
Left detailed message on patient voicemail per DPR access.

## 2022-02-14 LAB — SURGICAL PATHOLOGY

## 2022-02-24 ENCOUNTER — Telehealth: Payer: Self-pay | Admitting: Obstetrics and Gynecology

## 2022-02-24 NOTE — Telephone Encounter (Signed)
Please contact patient regarding her outstanding PTH, calcium and phosphorus from [facility].  I have not received results yet.   Was this completed?

## 2022-02-24 NOTE — Telephone Encounter (Signed)
Patient said she completely forget about this and will try to get them today if not today next week after the holiday.

## 2022-02-24 NOTE — Telephone Encounter (Signed)
Encounter reviewed and closed.  

## 2022-04-04 ENCOUNTER — Encounter: Payer: Self-pay | Admitting: Nurse Practitioner

## 2022-04-04 ENCOUNTER — Other Ambulatory Visit: Payer: Self-pay | Admitting: Nurse Practitioner

## 2022-04-04 ENCOUNTER — Ambulatory Visit (INDEPENDENT_AMBULATORY_CARE_PROVIDER_SITE_OTHER): Payer: BC Managed Care – PPO | Admitting: Nurse Practitioner

## 2022-04-04 VITALS — BP 120/80 | HR 69 | Temp 98.3°F | Resp 16 | Ht 63.0 in | Wt 132.6 lb

## 2022-04-04 DIAGNOSIS — Z8679 Personal history of other diseases of the circulatory system: Secondary | ICD-10-CM | POA: Diagnosis not present

## 2022-04-04 DIAGNOSIS — R519 Headache, unspecified: Secondary | ICD-10-CM | POA: Diagnosis not present

## 2022-04-04 DIAGNOSIS — F411 Generalized anxiety disorder: Secondary | ICD-10-CM | POA: Diagnosis not present

## 2022-04-04 DIAGNOSIS — I10 Essential (primary) hypertension: Secondary | ICD-10-CM

## 2022-04-04 DIAGNOSIS — R0989 Other specified symptoms and signs involving the circulatory and respiratory systems: Secondary | ICD-10-CM | POA: Diagnosis not present

## 2022-04-04 MED ORDER — AMLODIPINE BESYLATE 2.5 MG PO TABS: 2.5000 mg | ORAL_TABLET | Freq: Every day | ORAL | 1 refills | Status: DC
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
## 2022-04-05 ENCOUNTER — Telehealth: Payer: Self-pay

## 2022-04-05 ENCOUNTER — Ambulatory Visit
Admission: RE | Admit: 2022-04-05 | Discharge: 2022-04-05 | Disposition: A | Discharge status: Home / Self Care | Payer: BC Managed Care – PPO | Source: Ambulatory Visit | Attending: Nurse Practitioner | Admitting: Nurse Practitioner

## 2022-04-05 DIAGNOSIS — Z8679 Personal history of other diseases of the circulatory system: Secondary | ICD-10-CM | POA: Diagnosis not present

## 2022-04-05 DIAGNOSIS — R0989 Other specified symptoms and signs involving the circulatory and respiratory systems: Secondary | ICD-10-CM | POA: Insufficient documentation

## 2022-04-05 DIAGNOSIS — R519 Headache, unspecified: Secondary | ICD-10-CM | POA: Diagnosis not present

## 2022-04-05 MED ORDER — IOHEXOL 350 MG/ML SOLN
75.0000 mL | Freq: Once | INTRAVENOUS | Status: AC | PRN
  Administered 2022-04-05: 75 mL via INTRAVENOUS

## 2022-04-05 MED ORDER — BUPROPION HCL ER (XL) 300 MG PO TB24: 300.0000 mg | ORAL_TABLET | Freq: Every day | ORAL | 1 refills | Status: DC

## 2022-04-05 NOTE — Telephone Encounter (Signed)
Also advised pt that if her symptoms worse go to ED

## 2022-04-05 NOTE — Telephone Encounter (Signed)
Pt advised as per alyssa that Ct scan looks good we put referral for neurology

## 2022-04-06 ENCOUNTER — Other Ambulatory Visit: Payer: Self-pay | Admitting: Nurse Practitioner

## 2022-04-06 ENCOUNTER — Telehealth: Payer: Self-pay | Admitting: Nurse Practitioner

## 2022-04-06 DIAGNOSIS — R519 Headache, unspecified: Secondary | ICD-10-CM

## 2022-04-06 DIAGNOSIS — Z8679 Personal history of other diseases of the circulatory system: Secondary | ICD-10-CM

## 2022-04-06 DIAGNOSIS — R0989 Other specified symptoms and signs involving the circulatory and respiratory systems: Secondary | ICD-10-CM

## 2022-04-06 NOTE — Telephone Encounter (Signed)
Awaiting 04/06/22 office notes for Neurology referral-Toni

## 2022-04-06 NOTE — Progress Notes (Signed)
Recent visit for headaches and labile BP, history of SAH. Stat CT angio head was negative for any acute abnormalities. Referred to neurology for further evaluation.

## 2022-04-07 NOTE — Telephone Encounter (Signed)
Pt referral is placed and toni care of referral

## 2022-04-18 ENCOUNTER — Ambulatory Visit (INDEPENDENT_AMBULATORY_CARE_PROVIDER_SITE_OTHER): Payer: BC Managed Care – PPO | Admitting: Nurse Practitioner

## 2022-04-18 ENCOUNTER — Encounter: Payer: Self-pay | Admitting: Nurse Practitioner

## 2022-04-18 VITALS — BP 125/69 | HR 60 | Temp 97.0°F | Resp 16 | Ht 63.0 in | Wt 132.4 lb

## 2022-04-18 DIAGNOSIS — R0989 Other specified symptoms and signs involving the circulatory and respiratory systems: Secondary | ICD-10-CM | POA: Diagnosis not present

## 2022-04-18 DIAGNOSIS — Z8679 Personal history of other diseases of the circulatory system: Secondary | ICD-10-CM
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
## 2022-04-19 ENCOUNTER — Telehealth: Payer: Self-pay | Admitting: Nurse Practitioner

## 2022-04-19 NOTE — Telephone Encounter (Signed)
Neurology referral faxed [place]; [phone]

## 2022-04-23 ENCOUNTER — Encounter: Payer: Self-pay | Admitting: Nurse Practitioner

## 2022-04-24 DIAGNOSIS — L578 Other skin changes due to chronic exposure to nonionizing radiation: Secondary | ICD-10-CM | POA: Diagnosis not present

## 2022-04-24 DIAGNOSIS — I781 Nevus, non-neoplastic: Secondary | ICD-10-CM | POA: Diagnosis not present

## 2022-04-24 DIAGNOSIS — Z86018 Personal history of other benign neoplasm: Secondary | ICD-10-CM | POA: Diagnosis not present

## 2022-06-12 ENCOUNTER — Other Ambulatory Visit: Payer: Self-pay | Admitting: Nurse Practitioner

## 2022-06-12 DIAGNOSIS — R0989 Other specified symptoms and signs involving the circulatory and respiratory systems: Secondary | ICD-10-CM
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
## 2022-08-31 ENCOUNTER — Ambulatory Visit: Payer: Managed Care, Other (non HMO) | Admitting: Obstetrics and Gynecology

## 2022-09-11 ENCOUNTER — Telehealth: Payer: Self-pay

## 2022-09-11 MED ORDER — ALPRAZOLAM 0.25 MG PO TABS: 0.2500 mg | ORAL_TABLET | Freq: Two times a day (BID) | ORAL | 0 refills | Status: DC | PRN

## 2022-09-12 NOTE — Telephone Encounter (Signed)
Patient notified
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
## 2022-09-19 ENCOUNTER — Ambulatory Visit: Payer: Managed Care, Other (non HMO) | Admitting: Obstetrics and Gynecology

## 2022-09-21 ENCOUNTER — Ambulatory Visit: Payer: BC Managed Care – PPO | Admitting: Nurse Practitioner

## 2022-09-28 ENCOUNTER — Other Ambulatory Visit: Payer: Self-pay | Admitting: Nurse Practitioner

## 2022-09-28 DIAGNOSIS — F411 Generalized anxiety disorder: Secondary | ICD-10-CM
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
## 2022-10-13 ENCOUNTER — Encounter: Payer: Self-pay | Admitting: Nurse Practitioner

## 2022-10-13 ENCOUNTER — Ambulatory Visit (INDEPENDENT_AMBULATORY_CARE_PROVIDER_SITE_OTHER): Payer: BC Managed Care – PPO | Admitting: Nurse Practitioner

## 2022-10-13 VITALS — BP 138/85 | HR 64 | Temp 98.3°F | Resp 16 | Ht 63.0 in | Wt 124.4 lb

## 2022-10-13 DIAGNOSIS — E538 Deficiency of other specified B group vitamins: Secondary | ICD-10-CM | POA: Diagnosis not present

## 2022-10-13 DIAGNOSIS — F4321 Adjustment disorder with depressed mood: Secondary | ICD-10-CM

## 2022-10-13 DIAGNOSIS — E782 Mixed hyperlipidemia: Secondary | ICD-10-CM

## 2022-10-13 DIAGNOSIS — I67841 Reversible cerebrovascular vasoconstriction syndrome: Secondary | ICD-10-CM

## 2022-10-13 DIAGNOSIS — F329 Major depressive disorder, single episode, unspecified: Secondary | ICD-10-CM

## 2022-10-13 DIAGNOSIS — M85859 Other specified disorders of bone density and structure, unspecified thigh: Secondary | ICD-10-CM

## 2022-10-13 DIAGNOSIS — E559 Vitamin D deficiency, unspecified: Secondary | ICD-10-CM | POA: Diagnosis not present

## 2022-10-13 DIAGNOSIS — F411 Generalized anxiety disorder: Secondary | ICD-10-CM

## 2022-10-13 DIAGNOSIS — Z0000 Encounter for general adult medical examination without abnormal findings: Secondary | ICD-10-CM

## 2022-10-13 MED ORDER — ALPRAZOLAM 0.25 MG PO TABS: 0.2500 mg | ORAL_TABLET | Freq: Two times a day (BID) | ORAL | 0 refills | Status: DC | PRN

## 2022-10-13 MED ORDER — AUVELITY 45-105 MG PO TBCR
1.0000 | EXTENDED_RELEASE_TABLET | Freq: Two times a day (BID) | ORAL | 5 refills | Status: DC
Start: 2022-10-13 — End: 2022-10-19
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
## 2022-10-17 ENCOUNTER — Ambulatory Visit (INDEPENDENT_AMBULATORY_CARE_PROVIDER_SITE_OTHER): Payer: BC Managed Care – PPO | Admitting: Obstetrics and Gynecology

## 2022-10-17 ENCOUNTER — Encounter: Payer: Self-pay | Admitting: Obstetrics and Gynecology

## 2022-10-17 ENCOUNTER — Telehealth: Payer: Self-pay

## 2022-10-17 VITALS — BP 128/82 | HR 68 | Ht 63.0 in | Wt 124.0 lb

## 2022-10-17 DIAGNOSIS — M810 Age-related osteoporosis without current pathological fracture: Secondary | ICD-10-CM | POA: Diagnosis not present

## 2022-10-17 MED ORDER — ALENDRONATE SODIUM 70 MG PO TABS: 70.0000 mg | ORAL_TABLET | ORAL | 1 refills | Status: DC

## 2022-10-17 NOTE — Patient Instructions (Signed)
Alendronate Tablets What is this medication? ALENDRONATE (a LEN droe nate) prevents and treats osteoporosis. It may also be used to treat Paget disease of the bone. It works by Interior and spatial designer stronger and less likely to break (fracture). It belongs to a group of medications called bisphosphonates. This medicine may be used for other purposes; ask your health care provider or pharmacist if you have questions. COMMON BRAND NAME(S): Fosamax What should I tell my care team before I take this medication? They need to know if you have any of these conditions: Bleeding disorder Cancer Dental disease Difficulty swallowing Infection (fever, chills, cough, sore throat, pain or trouble passing urine) Kidney disease Low levels of calcium or other minerals in the blood Low red blood cell counts Receiving steroids like dexamethasone or prednisone Stomach or intestine problems Trouble sitting or standing for 30 minutes An unusual or allergic reaction to alendronate, other medications, foods, dyes or preservatives Pregnant or trying to get pregnant Breast-feeding How should I use this medication? Take this medication by mouth with a full glass of water. Take it as directed on the prescription label at the same time every day. Take the dose right after waking up. Do not eat or drink anything before taking it. Do not take it with any other drink except water. Do not chew or crush the tablet. After taking it, do not eat breakfast, drink, or take any other medications or vitamins for at least 30 minutes. Sit or stand up for at least 30 minutes after you take it. Do not lie down. Keep taking it unless your care team tells you to stop. A special MedGuide will be given to you by the pharmacist with each prescription and refill. Be sure to read this information carefully each time. Talk to your care team about the use of this medication in children. Special care may be needed. Overdosage: If you think you have  taken too much of this medicine contact a poison control center or emergency room at once. NOTE: This medicine is only for you. Do not share this medicine with others. What if I miss a dose? If you take your medication once a day, skip it. Take your next dose at the scheduled time the next morning. Do not take two doses on the same day. If you take your medication once a week, take the missed dose on the morning after you remember. Do not take two doses on the same day. What may interact with this medication? Aluminum hydroxide Antacids Aspirin Calcium supplements Medications for inflammation like ibuprofen, naproxen, and others Iron supplements Magnesium supplements Vitamins with minerals This list may not describe all possible interactions. Give your health care provider a list of all the medicines, herbs, non-prescription drugs, or dietary supplements you use. Also tell them if you smoke, drink alcohol, or use illegal drugs. Some items may interact with your medicine. What should I watch for while using this medication? Visit your care team for regular checks on your progress. It may be some time before you see the benefit from this medication. Some people who take this medication have severe bone, joint, or muscle pain. This medication may also increase your risk for jaw problems or a broken thigh bone. Tell your care team right away if you have severe pain in your jaw, bones, joints, or muscles. Tell you care team if you have any pain that does not go away or that gets worse. Tell your dentist and dental surgeon that you are  taking this medication. You should not have major dental surgery while on this medication. See your dentist to have a dental exam and fix any dental problems before starting this medication. Take good care of your teeth while on this medication. Make sure you see your dentist for regular follow-up appointments. You should make sure you get enough calcium and vitamin D  while you are taking this medication. Discuss the foods you eat and the vitamins you take with your care team. You may need blood work done while you are taking this medication. What side effects may I notice from receiving this medication? Side effects that you should report to your care team as soon as possible: Allergic reactions--skin rash, itching, hives, swelling of the face, lips, tongue, or throat Low calcium level--muscle pain or cramps, confusion, tingling, or numbness in the hands or feet Osteonecrosis of the jaw--pain, swelling, or redness in the mouth, numbness of the jaw, poor healing after dental work, unusual discharge from the mouth, visible bones in the mouth Pain or trouble swallowing Severe bone, joint, or muscle pain Stomach bleeding--bloody or black, tar-like stools, vomiting blood or brown material that looks like coffee grounds Side effects that usually do not require medical attention (report to your care team if they continue or are bothersome): Constipation Diarrhea Nausea Stomach pain This list may not describe all possible side effects. Call your doctor for medical advice about side effects. You may report side effects to FDA at 1-800-FDA-1088. Where should I keep my medication? Keep out of the reach of children and pets. Store at room temperature between 15 and 30 degrees C (59 and 86 degrees F). Throw away any unused medication after the expiration date. NOTE: This sheet is a summary. It may not cover all possible information. If you have questions about this medicine, talk to your doctor, pharmacist, or health care provider.  2024 Elsevier/Gold Standard (2020-03-04 00:00:00)

## 2022-10-19 MED ORDER — AUVELITY 45-105 MG PO TBCR: 1.0000 | EXTENDED_RELEASE_TABLET | Freq: Two times a day (BID) | ORAL | 5 refills | Status: DC

## 2022-10-19 NOTE — Telephone Encounter (Signed)
Pt.notified

## 2022-10-19 NOTE — Telephone Encounter (Signed)
Lmom that we send pres to [facility]

## 2022-10-19 NOTE — Addendum Note (Signed)
Addended by: Sallyanne Kuster on: 10/19/2022 07:38 AM   Modules accepted: Orders

## 2022-10-25 DIAGNOSIS — E538 Deficiency of other specified B group vitamins: Secondary | ICD-10-CM | POA: Diagnosis not present

## 2022-10-25 DIAGNOSIS — I67841 Reversible cerebrovascular vasoconstriction syndrome: Secondary | ICD-10-CM | POA: Diagnosis not present

## 2022-10-25 DIAGNOSIS — E559 Vitamin D deficiency, unspecified: Secondary | ICD-10-CM | POA: Diagnosis not present

## 2022-10-25 DIAGNOSIS — E782 Mixed hyperlipidemia: Secondary | ICD-10-CM | POA: Diagnosis not present

## 2022-10-25 DIAGNOSIS — R7301 Impaired fasting glucose: Secondary | ICD-10-CM | POA: Diagnosis not present

## 2022-10-25 DIAGNOSIS — F411 Generalized anxiety disorder: Secondary | ICD-10-CM | POA: Diagnosis not present

## 2022-10-26 LAB — LIPID PANEL: Triglycerides: 85 mg/dL (ref 0–149)

## 2022-11-03 ENCOUNTER — Encounter: Payer: BC Managed Care – PPO | Admitting: Nurse Practitioner

## 2022-11-09 ENCOUNTER — Encounter: Payer: BC Managed Care – PPO | Admitting: Nurse Practitioner

## 2022-11-20 ENCOUNTER — Encounter: Payer: Self-pay | Admitting: Nurse Practitioner

## 2022-11-20 ENCOUNTER — Ambulatory Visit (INDEPENDENT_AMBULATORY_CARE_PROVIDER_SITE_OTHER): Payer: BC Managed Care – PPO | Admitting: Nurse Practitioner

## 2022-11-20 VITALS — BP 130/82 | HR 60 | Temp 97.8°F | Resp 16 | Ht 63.0 in | Wt 124.0 lb

## 2022-11-20 DIAGNOSIS — M816 Localized osteoporosis [Lequesne]: Secondary | ICD-10-CM

## 2022-11-20 DIAGNOSIS — Z23 Encounter for immunization: Secondary | ICD-10-CM | POA: Diagnosis not present

## 2022-11-20 DIAGNOSIS — F411 Generalized anxiety disorder: Secondary | ICD-10-CM

## 2022-11-20 DIAGNOSIS — Z0001 Encounter for general adult medical examination with abnormal findings: Secondary | ICD-10-CM | POA: Diagnosis not present

## 2022-11-20 DIAGNOSIS — R300 Dysuria: Secondary | ICD-10-CM

## 2022-11-20 DIAGNOSIS — R7301 Impaired fasting glucose: Secondary | ICD-10-CM

## 2022-11-20 DIAGNOSIS — E782 Mixed hyperlipidemia: Secondary | ICD-10-CM
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
## 2022-11-21 ENCOUNTER — Encounter: Payer: Self-pay | Admitting: Nurse Practitioner

## 2022-11-21 LAB — UA/M W/RFLX CULTURE, ROUTINE
Bilirubin, UA: NEGATIVE
Glucose, UA: NEGATIVE
Ketones, UA: NEGATIVE
Leukocytes,UA: NEGATIVE
Nitrite, UA: NEGATIVE
Protein,UA: NEGATIVE
RBC, UA: NEGATIVE
Specific Gravity, UA: 1.006 (ref 1.005–1.030)
Urobilinogen, Ur: 0.2 mg/dL (ref 0.2–1.0)
pH, UA: 6.5 (ref 5.0–7.5)

## 2022-11-21 LAB — MICROSCOPIC EXAMINATION
Bacteria, UA: NONE SEEN
Casts: NONE SEEN /LPF
RBC, Urine: NONE SEEN /HPF (ref 0–2)

## 2022-11-22 ENCOUNTER — Ambulatory Visit
Admission: RE | Admit: 2022-11-22 | Discharge: 2022-11-22 | Disposition: A | Discharge status: Home / Self Care | Payer: BC Managed Care – PPO | Source: Ambulatory Visit | Attending: Obstetrics and Gynecology | Admitting: Obstetrics and Gynecology

## 2022-11-22 DIAGNOSIS — M810 Age-related osteoporosis without current pathological fracture: Secondary | ICD-10-CM | POA: Insufficient documentation

## 2022-12-05 ENCOUNTER — Telehealth: Payer: Self-pay

## 2022-12-05 NOTE — Telephone Encounter (Signed)
Pt called that she want med for menopause advised her she can call  her GYN

## 2022-12-06 ENCOUNTER — Telehealth: Payer: Self-pay

## 2022-12-06 NOTE — Telephone Encounter (Signed)
It would be safest for [facility] not to use any hormonal treatment due to her history of breast cancer and her history of a subarachnoid hemorrhage.   Testosterone is metabolized into estrogen.    How about prescription vaginal vitamin E suppositories? I can prescribe compounded vitamin E vaginal suppositories 200 u/ml, place one in the vagina at night twice weekly.  Disp:  12, RF 0. (These are hormone free.)

## 2022-12-06 NOTE — Telephone Encounter (Signed)
Spoke with patient, advised as seen below per Dr. Edward Jolly. Patient agreeable and request Rx to [facility] that will deliver. Patient verbalizes understanding and is agreeable.    Contacted [facility], [facility], [facility] and [facility]  -close to patients address in [place]. No longer compound Vit E vaginal suppositories.    Dr. Edward Jolly -please review. Looks like there are options on [employer] for Vitamin E vaginal suppositories, please review and advise.

## 2022-12-06 NOTE — Telephone Encounter (Signed)
Pt LVM in triage line stating that since she has a hx of breast cancer but is PM and is dealing with PM sxs of vaginal dryness, she is wondering if she could be prescribed a low dose testosterone to help provide relief?  LAEX 01/31/2022-scheduled for 02/05/2023  Last seen for osteoporosis on 10/17/2022  Double mastectomy 2006  Last imaging (MRI) 2015- birads 2  Please advise.

## 2022-12-06 NOTE — Telephone Encounter (Signed)
Ok for OTC vaginal vitamin E suppositories or vaginal liquid preparation from [employer].  Follow directions on the package.

## 2022-12-07 NOTE — Telephone Encounter (Signed)
Call placed to patient, left detailed message, ok per dpr.  Advised per Dr. Edward Jolly. Return call to office at [phone], option 4,  if any additional questions.    Encounter closed.
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
## 2023-02-05 ENCOUNTER — Ambulatory Visit: Payer: Managed Care, Other (non HMO) | Admitting: Obstetrics and Gynecology

## 2023-02-05 ENCOUNTER — Ambulatory Visit: Payer: Self-pay | Admitting: Cardiovascular Disease

## 2023-02-09 ENCOUNTER — Encounter: Payer: Self-pay | Admitting: Obstetrics and Gynecology

## 2023-02-14 ENCOUNTER — Encounter: Payer: Self-pay | Admitting: Obstetrics and Gynecology

## 2023-02-14 ENCOUNTER — Ambulatory Visit (INDEPENDENT_AMBULATORY_CARE_PROVIDER_SITE_OTHER): Payer: BC Managed Care – PPO | Admitting: Obstetrics and Gynecology

## 2023-02-14 ENCOUNTER — Other Ambulatory Visit (HOSPITAL_COMMUNITY)
Admission: RE | Admit: 2023-02-14 | Discharge: 2023-02-14 | Disposition: A | Discharge status: Home / Self Care | Payer: BC Managed Care – PPO | Source: Ambulatory Visit | Attending: Obstetrics and Gynecology | Admitting: Obstetrics and Gynecology

## 2023-02-14 VITALS — BP 126/74 | HR 69 | Ht 63.5 in | Wt 128.0 lb

## 2023-02-14 DIAGNOSIS — Z9189 Other specified personal risk factors, not elsewhere classified: Secondary | ICD-10-CM

## 2023-02-14 DIAGNOSIS — Z124 Encounter for screening for malignant neoplasm of cervix: Secondary | ICD-10-CM

## 2023-02-14 DIAGNOSIS — N9089 Other specified noninflammatory disorders of vulva and perineum: Secondary | ICD-10-CM

## 2023-02-14 DIAGNOSIS — Z01419 Encounter for gynecological examination (general) (routine) without abnormal findings: Secondary | ICD-10-CM | POA: Diagnosis not present

## 2023-02-14 MED ORDER — ALENDRONATE SODIUM 70 MG PO TABS: 70.0000 mg | ORAL_TABLET | ORAL | 3 refills | Status: DC

## 2023-02-14 MED ORDER — VALACYCLOVIR HCL 500 MG PO TABS: 500.0000 mg | ORAL_TABLET | Freq: Two times a day (BID) | ORAL | 3 refills | Status: DC

## 2023-02-14 NOTE — Patient Instructions (Signed)

## 2023-02-21 LAB — CYTOLOGY - PAP
Adequacy: ABSENT
Comment: NEGATIVE
Diagnosis: NEGATIVE
Diagnosis: REACTIVE
High risk HPV: NEGATIVE
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
## 2023-03-20 ENCOUNTER — Ambulatory Visit: Payer: Self-pay | Admitting: Nurse Practitioner

## 2023-03-23 ENCOUNTER — Telehealth: Payer: Self-pay

## 2023-03-23 NOTE — Telephone Encounter (Signed)
Completed P.A. for patient's [place].

## 2023-03-26 ENCOUNTER — Encounter: Payer: Self-pay | Admitting: Obstetrics and Gynecology

## 2023-03-26 ENCOUNTER — Other Ambulatory Visit (HOSPITAL_COMMUNITY)
Admission: RE | Admit: 2023-03-26 | Discharge: 2023-03-26 | Disposition: A | Discharge status: Home / Self Care | Payer: BC Managed Care – PPO | Source: Ambulatory Visit | Attending: Obstetrics and Gynecology | Admitting: Obstetrics and Gynecology

## 2023-03-26 ENCOUNTER — Ambulatory Visit (INDEPENDENT_AMBULATORY_CARE_PROVIDER_SITE_OTHER): Payer: BC Managed Care – PPO | Admitting: Obstetrics and Gynecology

## 2023-03-26 VITALS — BP 128/84 | HR 61 | Ht 63.5 in | Wt 128.0 lb

## 2023-03-26 DIAGNOSIS — N9089 Other specified noninflammatory disorders of vulva and perineum: Secondary | ICD-10-CM | POA: Insufficient documentation

## 2023-03-26 NOTE — Patient Instructions (Signed)
 Vulva Biopsy, Care After The following information offers guidance on how to care for yourself after your procedure. Your health care provider may also give you more specific instructions. If you have problems or questions, contact your health care provider. What can I expect after the procedure? After the procedure, it is common to have: Slight bleeding from the biopsy site. Slight pain or discomfort at the biopsy site. Follow these instructions at home: Biopsy site care  Follow instructions from your health care provider about how to take care of your biopsy site. Make sure you: Clean the area using water and mild soap twice a day or as told by your health care provider. Gently pat the area dry. You may shower 24 hours after the procedure. If you were prescribed an antibiotic ointment, apply it as told by your health care provider. Do not stop using the antibiotic even if your condition improves. If told by your health care provider, take a sitz bath to help with pain and discomfort. This is a warm water bath that you take while sitting down. Do this as often as told by your health care provider. The water should only come up to your hips and cover your buttocks. You may pat the area dry with a soft, clean towel. Leave stitches (sutures), skin glue, or adhesive strips in place. These skin closures may need to stay in place for 2 weeks or longer. If adhesive strip edges start to loosen and curl up, you may trim the loose edges. Do not remove adhesive strips completely unless your health care provider tells you to do that. Check your biopsy site every day for signs of infection. It may be helpful to use a handheld mirror to do this. Check for: Redness, swelling, or more pain. More fluid or blood. Warmth. Pus or a bad smell. Do not rub the biopsy area after urinating. Gently pat the area dry or use a bottle filled with warm water (peri bottle) to clean the area. Gently wipe from front to  back. Lifestyle Wear loose, cotton underwear. Do not wear tight pants. For at least 1 week or until your health care provider approves: Do not use tampons, douche, or put anything inside your vagina. Do not have sex. Until your health care provider approves: Do not exercise, such as running or biking. Do not swim or use a hot tub. General instructions Take over-the-counter and prescription medicines only as told by your health care provider. Drink enough fluid to keep your urine pale yellow. Use a sanitary napkin until the bleeding stops. If told, put ice on the biopsy site. To do this: Place ice in a plastic bag. Place a towel between your skin and the bag. Leave the ice on for 20 minutes, 2-3 times a day. Remove the ice if your skin turns bright red. This is very important. If you cannot feel pain, heat, or cold, you have a greater risk of damage to the area. Keep all follow-up visits. This is important. Contact a health care provider if: You have redness, swelling, or more pain around your biopsy site. You have more fluid or blood coming from your biopsy site. Your biopsy site feels warm to the touch. Your pain is not controlled with medicine or ice packs. You have a fever or chills. Get help right away if: You have heavy bleeding from the vulva. You have pus or a bad smell coming from the biopsy site. You have abdominal pain. Summary After the procedure, it  is common to have slight bleeding and discomfort at the biopsy site. Follow instructions from your health care provider after your biopsy. Take sitz baths as told by your health care provider to help with pain and discomfort. Leave any sutures in place. Contact your health care provider if you notice any signs of infection around the biopsy site, including redness, swelling, more pain, more fluid or blood, or warmth. Keep all follow-up visits. This is important. This information is not intended to replace advice given to you  by your health care provider. Make sure you discuss any questions you have with your health care provider. Document Revised: 11/09/2020 Document Reviewed: 11/09/2020 Elsevier Patient Education  2024 [employer].

## 2023-03-30 LAB — SURGICAL PATHOLOGY

## 2023-04-01 ENCOUNTER — Encounter: Payer: Self-pay | Admitting: Obstetrics and Gynecology

## 2023-04-06 NOTE — Progress Notes (Signed)
Voicemail left for patient with biopsy results and recommendations.  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  All of those areas were essentially removed and further treatment is not needed.  Patient was instructed to call if she desired suture removal after 2 weeks if they were bothersome.  Patient was informed a message was sent to MyChart with these instructions as well.  Patient was advised to call back if she had any questions.

## 2023-04-10 ENCOUNTER — Telehealth: Payer: Self-pay
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
## 2023-04-17 ENCOUNTER — Encounter: Payer: Self-pay | Admitting: Nurse Practitioner

## 2023-04-17 ENCOUNTER — Ambulatory Visit: Payer: 59 | Attending: Nurse Practitioner | Admitting: Nurse Practitioner

## 2023-04-17 VITALS — BP 120/78 | HR 81 | Ht 64.0 in | Wt 125.8 lb

## 2023-04-17 DIAGNOSIS — R911 Solitary pulmonary nodule: Secondary | ICD-10-CM

## 2023-04-17 DIAGNOSIS — I10 Essential (primary) hypertension: Secondary | ICD-10-CM

## 2023-04-17 DIAGNOSIS — R011 Cardiac murmur, unspecified: Secondary | ICD-10-CM

## 2023-04-17 DIAGNOSIS — E7800 Pure hypercholesterolemia, unspecified: Secondary | ICD-10-CM | POA: Diagnosis not present

## 2023-04-17 DIAGNOSIS — Z136 Encounter for screening for cardiovascular disorders: Secondary | ICD-10-CM

## 2023-04-17 NOTE — Patient Instructions (Signed)
Medication Instructions:  Your physician recommends that you continue on your current medications as directed. Please refer to the Current Medication list given to you today.   *If you need a refill on your cardiac medications before your next appointment, please call your pharmacy*   Lab Work: NONE ordered at this time of appointment   Testing/Procedures: NONE ordered at this time of appointment   Follow-Up: At [facility], you and your health needs are our priority.  As part of our continuing mission to provide you with exceptional heart care, we have created designated Provider Care Teams.  These Care Teams include your primary Cardiologist (physician) and Advanced Practice Providers (APPs -  Physician Assistants and Nurse Practitioners) who all work together to provide you with the care you need, when you need it.  We recommend signing up for the patient portal called "MyChart".  Sign up information is provided on this After Visit Summary.  MyChart is used to connect with patients for Virtual Visits (Telemedicine).  Patients are able to view lab/test results, encounter notes, upcoming appointments, etc.  Non-urgent messages can be sent to your provider as well.   To learn more about what you can do with MyChart, go to [email or web address].    Your next appointment:   1 year(s)  Provider:   Nicki Guadalajara, MD  or Bernadene Person, NP        Other Instructions

## 2023-04-18 ENCOUNTER — Encounter: Payer: Self-pay | Admitting: Nurse Practitioner

## 2023-04-20 NOTE — Telephone Encounter (Signed)
Left message for patient to give office a call back.

## 2023-04-20 NOTE — Telephone Encounter (Signed)
Send the auvelity to her pharmacy and I'll do a new PA

## 2023-04-25 DIAGNOSIS — L578 Other skin changes due to chronic exposure to nonionizing radiation: Secondary | ICD-10-CM | POA: Diagnosis not present

## 2023-04-25 DIAGNOSIS — Z86018 Personal history of other benign neoplasm: Secondary | ICD-10-CM | POA: Diagnosis not present

## 2023-04-25 DIAGNOSIS — G548 Other nerve root and plexus disorders: Secondary | ICD-10-CM | POA: Diagnosis not present

## 2023-04-26 ENCOUNTER — Telehealth: Payer: Self-pay | Admitting: Nurse Practitioner

## 2023-04-26 DIAGNOSIS — F411 Generalized anxiety disorder: Secondary | ICD-10-CM

## 2023-04-30 MED ORDER — BUPROPION HCL ER (XL) 300 MG PO TB24: 300.0000 mg | ORAL_TABLET | Freq: Every day | ORAL | 1 refills | Status: DC

## 2023-04-30 NOTE — Telephone Encounter (Signed)
 Lmom to call us back

## 2023-05-03 ENCOUNTER — Encounter: Payer: Self-pay | Admitting: Nurse Practitioner

## 2023-05-03 ENCOUNTER — Ambulatory Visit (INDEPENDENT_AMBULATORY_CARE_PROVIDER_SITE_OTHER): Payer: 59 | Admitting: Nurse Practitioner

## 2023-05-03 VITALS — BP 124/82 | HR 70 | Temp 98.3°F | Resp 16 | Ht 64.0 in | Wt 124.2 lb

## 2023-05-03 DIAGNOSIS — F411 Generalized anxiety disorder: Secondary | ICD-10-CM | POA: Diagnosis not present

## 2023-05-03 DIAGNOSIS — F329 Major depressive disorder, single episode, unspecified: Secondary | ICD-10-CM | POA: Diagnosis not present

## 2023-05-03 DIAGNOSIS — E782 Mixed hyperlipidemia: Secondary | ICD-10-CM | POA: Diagnosis not present

## 2023-05-03 DIAGNOSIS — M816 Localized osteoporosis [Lequesne]: Secondary | ICD-10-CM | POA: Diagnosis not present
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
## 2023-05-12 ENCOUNTER — Encounter: Payer: Self-pay | Admitting: Nurse Practitioner

## 2023-05-21 ENCOUNTER — Ambulatory Visit: Payer: 59 | Admitting: Nurse Practitioner
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
## 2023-11-14 ENCOUNTER — Encounter: Payer: Self-pay | Admitting: Obstetrics and Gynecology

## 2023-11-14 ENCOUNTER — Other Ambulatory Visit: Payer: Self-pay | Admitting: Nurse Practitioner

## 2023-11-14 ENCOUNTER — Other Ambulatory Visit (HOSPITAL_COMMUNITY)
Admission: RE | Admit: 2023-11-14 | Discharge: 2023-11-14 | Disposition: A | Discharge status: Home / Self Care | Source: Ambulatory Visit | Attending: Obstetrics and Gynecology | Admitting: Obstetrics and Gynecology

## 2023-11-14 ENCOUNTER — Ambulatory Visit (INDEPENDENT_AMBULATORY_CARE_PROVIDER_SITE_OTHER): Admitting: Obstetrics and Gynecology

## 2023-11-14 VITALS — BP 126/86 | HR 74 | Ht 63.5 in | Wt 126.0 lb

## 2023-11-14 DIAGNOSIS — N898 Other specified noninflammatory disorders of vagina: Secondary | ICD-10-CM

## 2023-11-14 DIAGNOSIS — F411 Generalized anxiety disorder: Secondary | ICD-10-CM

## 2023-11-14 DIAGNOSIS — L723 Sebaceous cyst: Secondary | ICD-10-CM | POA: Diagnosis not present

## 2023-11-14 NOTE — Patient Instructions (Signed)
 Pocket of Fluid in the Skin (Epidermoid Cyst): What to Know  An epidermoid cyst is a pocket of fluid that can form under your skin. It's filled with thick, oily substance that your skin glands make. These cysts occur anywhere on your body. They're usually harmless and don't cause problems unless they get inflamed or infected. What are the causes? An epidermoid cyst may be caused by: A blocked pore or hair follicle. An ingrown hair. This is a hair that curls and re-enters the skin instead of growing straight out of the skin. Skin irritation. Skin injuries. Some conditions that are passed from parent to child (inherited). Human papillomavirus (HPV). This is rare but can cause cysts on the bottom of the feet. Long-term (chronic) sun damage to the skin. What increases the risk? Having acne. Being female. Skin injuries. Being past puberty. Certain rare genetic disorders. What are the signs or symptoms? The only sign of this type of cyst may be a small, painless lump under the skin. When an epidermal cyst ruptures, it may become inflamed. Infections are rare, but symptoms may include: Redness. Inflammation. Tenderness. Warmth. Fever. A bad-smelling, grayish-white substance draining from the cyst. Pus draining from the cyst. How is this diagnosed? This condition is diagnosed with a physical exam. Sometimes, a tissue sample (biopsy) may need to be looked at under a microscope or tested for bacteria. You may be referred to a health care provider who specializes in skin care. This provider is called a dermatologist. How is this treated? If a cyst becomes inflamed, treatment may include: Opening and draining the cyst. Antibiotics. Steroid shots to lessen inflammation. Surgery to take out cysts that are large, painful, or could turn into cancer. Do not try to open or squeeze a cyst yourself. Follow these instructions at home: Medicines Take your medicines only as told. If you were given  antibiotics, take them as told. Do not stop taking them even if you start to feel better. General instructions Keep the area around your cyst clean and dry. Wear loose, dry clothing. Avoid touching your cyst. Check the area around your cyst every day for signs of infection. Check for: Redness, swelling, or pain. Fluid or blood. Warmth. Pus or a bad smell. Keep all follow-up visits to make sure the cyst isn't becoming uncomfortable or infected. Contact a health care provider if: You have any signs of infection. Your cyst doesn't get better or gets worse. You get a cyst that looks different from other cysts you've had. You have a fever. You have redness that spreads from the cyst. This information is not intended to replace advice given to you by your health care provider. Make sure you discuss any questions you have with your health care provider. Document Revised: 10/16/2022 Document Reviewed: 10/06/2022 Elsevier Patient Education  2024 [employer].

## 2023-11-16 LAB — CERVICOVAGINAL ANCILLARY ONLY
Bacterial Vaginitis (gardnerella): POSITIVE — AB
Candida Glabrata: NEGATIVE
Candida Vaginitis: NEGATIVE
Comment: NEGATIVE
Comment: NEGATIVE
Comment: NEGATIVE
Comment: NEGATIVE
Trichomonas: NEGATIVE

## 2023-11-19 ENCOUNTER — Ambulatory Visit: Payer: Self-pay | Admitting: Obstetrics and Gynecology

## 2023-11-19 ENCOUNTER — Other Ambulatory Visit: Payer: Self-pay | Admitting: Obstetrics and Gynecology

## 2023-11-19 MED ORDER — METRONIDAZOLE 500 MG PO TABS
500.0000 mg | ORAL_TABLET | Freq: Two times a day (BID) | ORAL | 0 refills | Status: DC
Start: 2023-11-19 — End: 2023-12-25

## 2023-11-19 NOTE — Telephone Encounter (Signed)
 Flagyl  Rx pended to patients pharmacy of choice.

## 2023-11-26 ENCOUNTER — Encounter: Payer: 59 | Admitting: Nurse Practitioner

## 2023-12-10 ENCOUNTER — Encounter: Admitting: Nurse Practitioner

## 2023-12-25 ENCOUNTER — Ambulatory Visit (INDEPENDENT_AMBULATORY_CARE_PROVIDER_SITE_OTHER): Admitting: Nurse Practitioner

## 2023-12-25 ENCOUNTER — Encounter: Payer: Self-pay | Admitting: Nurse Practitioner

## 2023-12-25 VITALS — BP 120/86 | HR 68 | Temp 97.7°F | Resp 16 | Ht 63.5 in | Wt 128.6 lb

## 2023-12-25 DIAGNOSIS — F411 Generalized anxiety disorder: Secondary | ICD-10-CM

## 2023-12-25 DIAGNOSIS — R300 Dysuria: Secondary | ICD-10-CM

## 2023-12-25 DIAGNOSIS — E538 Deficiency of other specified B group vitamins: Secondary | ICD-10-CM | POA: Diagnosis not present

## 2023-12-25 DIAGNOSIS — E559 Vitamin D deficiency, unspecified: Secondary | ICD-10-CM

## 2023-12-25 DIAGNOSIS — M85859 Other specified disorders of bone density and structure, unspecified thigh: Secondary | ICD-10-CM

## 2023-12-25 DIAGNOSIS — R7301 Impaired fasting glucose: Secondary | ICD-10-CM | POA: Diagnosis not present

## 2023-12-25 DIAGNOSIS — Z23 Encounter for immunization: Secondary | ICD-10-CM

## 2023-12-25 DIAGNOSIS — Z0001 Encounter for general adult medical examination with abnormal findings: Secondary | ICD-10-CM | POA: Diagnosis not present

## 2023-12-25 DIAGNOSIS — E782 Mixed hyperlipidemia: Secondary | ICD-10-CM | POA: Diagnosis not present

## 2023-12-25 MED ORDER — BUPROPION HCL ER (XL) 300 MG PO TB24: 300.0000 mg | ORAL_TABLET | Freq: Every day | ORAL | 3 refills | Status: AC
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
## 2023-12-28 LAB — UA/M W/RFLX CULTURE, ROUTINE
Bilirubin, UA: NEGATIVE
Glucose, UA: NEGATIVE
Ketones, UA: NEGATIVE
Nitrite, UA: NEGATIVE
Protein,UA: NEGATIVE
RBC, UA: NEGATIVE
Specific Gravity, UA: 1.008 (ref 1.005–1.030)
Urobilinogen, Ur: 0.2 mg/dL (ref 0.2–1.0)
pH, UA: 6.5 (ref 5.0–7.5)

## 2023-12-28 LAB — MICROSCOPIC EXAMINATION
Casts: NONE SEEN /LPF
RBC, Urine: NONE SEEN /HPF (ref 0–2)

## 2023-12-28 LAB — URINE CULTURE, REFLEX

## 2023-12-29 ENCOUNTER — Ambulatory Visit (HOSPITAL_COMMUNITY): Payer: Self-pay | Admitting: *Deleted

## 2023-12-30 ENCOUNTER — Other Ambulatory Visit: Payer: Self-pay | Admitting: Internal Medicine

## 2023-12-30 MED ORDER — CIPROFLOXACIN HCL 500 MG PO TABS: 500.0000 mg | ORAL_TABLET | Freq: Two times a day (BID) | ORAL | 0 refills | Status: DC

## 2023-12-31 NOTE — Telephone Encounter (Signed)
 Patient notified

## 2023-12-31 NOTE — Telephone Encounter (Signed)
-----   Message from [facility] sent at 12/30/2023  7:31 PM EDT ----- Please notify pt, she has UTI and cipro  is sent to her pharmacy ( 7 days) ----- Message ----- From: Interface, [facility] Lab Results In Sent: 12/26/2023   7:37 AM EDT To: Mardy Maxin, NP

## 2024-01-17 DIAGNOSIS — Z23 Encounter for immunization: Secondary | ICD-10-CM | POA: Diagnosis not present

## 2024-01-17 DIAGNOSIS — M85859 Other specified disorders of bone density and structure, unspecified thigh: Secondary | ICD-10-CM | POA: Diagnosis not present

## 2024-01-17 DIAGNOSIS — R7301 Impaired fasting glucose: Secondary | ICD-10-CM | POA: Diagnosis not present

## 2024-01-17 DIAGNOSIS — E559 Vitamin D deficiency, unspecified: Secondary | ICD-10-CM | POA: Diagnosis not present

## 2024-01-17 DIAGNOSIS — E538 Deficiency of other specified B group vitamins: Secondary | ICD-10-CM | POA: Diagnosis not present

## 2024-01-17 DIAGNOSIS — Z0001 Encounter for general adult medical examination with abnormal findings: Secondary | ICD-10-CM | POA: Diagnosis not present

## 2024-01-17 DIAGNOSIS — F411 Generalized anxiety disorder: Secondary | ICD-10-CM | POA: Diagnosis not present

## 2024-01-18 DIAGNOSIS — F411 Generalized anxiety disorder: Secondary | ICD-10-CM | POA: Insufficient documentation

## 2024-01-18 DIAGNOSIS — E782 Mixed hyperlipidemia: Secondary | ICD-10-CM | POA: Insufficient documentation

## 2024-01-18 LAB — CBC WITH DIFFERENTIAL/PLATELET
Basophils Absolute: 0.1 x10E3/uL (ref 0.0–0.2)
Basos: 1 %
EOS (ABSOLUTE): 0.1 x10E3/uL (ref 0.0–0.4)
Eos: 2 %
Hematocrit: 41.5 % (ref 34.0–46.6)
Hemoglobin: 13.7 g/dL (ref 11.1–15.9)
Immature Grans (Abs): 0 x10E3/uL (ref 0.0–0.1)
Immature Granulocytes: 0 %
Lymphocytes Absolute: 2.1 x10E3/uL (ref 0.7–3.1)
Lymphs: 34 %
MCH: 31.5 pg (ref 26.6–33.0)
MCHC: 33 g/dL (ref 31.5–35.7)
MCV: 95 fL (ref 79–97)
Monocytes Absolute: 0.6 x10E3/uL (ref 0.1–0.9)
Monocytes: 10 %
Neutrophils Absolute: 3.3 x10E3/uL (ref 1.4–7.0)
Neutrophils: 53 %
Platelets: 325 x10E3/uL (ref 150–450)
RBC: 4.35 x10E6/uL (ref 3.77–5.28)
RDW: 11.5 % — ABNORMAL LOW (ref 11.7–15.4)
WBC: 6.2 x10E3/uL (ref 3.4–10.8)

## 2024-01-18 LAB — LIPID PANEL
Chol/HDL Ratio: 3.2 ratio (ref 0.0–4.4)
Cholesterol, Total: 219 mg/dL — ABNORMAL HIGH (ref 100–199)
HDL: 68 mg/dL (ref >39)
LDL Chol Calc (NIH): 140 mg/dL — ABNORMAL HIGH (ref 0–99)
Triglycerides: 64 mg/dL (ref 0–149)
VLDL Cholesterol Cal: 11 mg/dL (ref 5–40)

## 2024-01-18 LAB — CMP14+EGFR
ALT: 27 IU/L (ref 0–32)
AST: 40 IU/L (ref 0–40)
Albumin: 4.8 g/dL (ref 3.8–4.9)
Alkaline Phosphatase: 85 IU/L (ref 49–135)
BUN/Creatinine Ratio: 24 — ABNORMAL HIGH (ref 9–23)
BUN: 16 mg/dL (ref 6–24)
Bilirubin Total: 0.3 mg/dL (ref 0.0–1.2)
CO2: 23 mmol/L (ref 20–29)
Calcium: 9.2 mg/dL (ref 8.7–10.2)
Chloride: 101 mmol/L (ref 96–106)
Creatinine, Ser: 0.66 mg/dL (ref 0.57–1.00)
Globulin, Total: 2.2 g/dL (ref 1.5–4.5)
Glucose: 85 mg/dL (ref 70–99)
Potassium: 4.7 mmol/L (ref 3.5–5.2)
Sodium: 140 mmol/L (ref 134–144)
Total Protein: 7 g/dL (ref 6.0–8.5)
eGFR: 102 mL/min/1.73 (ref >59)

## 2024-01-18 LAB — HGB A1C W/O EAG: Hgb A1c MFr Bld: 5.1 % (ref 4.8–5.6)

## 2024-01-18 LAB — B12 AND FOLATE PANEL
Folate: 13.4 ng/mL (ref >3.0)
Vitamin B-12: 827 pg/mL (ref 232–1245)

## 2024-01-18 LAB — TSH+FREE T4
Free T4: 1.13 ng/dL (ref 0.82–1.77)
TSH: 1.68 u[IU]/mL (ref 0.450–4.500)

## 2024-01-18 LAB — VITAMIN D 25 HYDROXY (VIT D DEFICIENCY, FRACTURES): Vit D, 25-Hydroxy: 73.6 ng/mL (ref 30.0–100.0)

## 2024-01-19 NOTE — Progress Notes (Signed)
 Thyroid lab, vitamin D , B12, folate, A1c, metabolic panel and CBC are normal.  Triglycerides have improved, total cholesterol and LDL remain elevated. --- make sure to limit red meat in diet, increase lean proteins in diet such as chicken, turkey and fish, and increase physical activity as tolerated. Take an over-the-counter fish oil supplement 1000-2000 mg daily. It would be beneficial to start statin medication to help lower cholesterol levels as well. If you are agreeable, I can prescribe the medication and send it to your pharmacy.   The 10-year ASCVD risk score (Arnett DK, et al., 2019) is: 2%   Values used to calculate the score:     Age: 58 years     Clincally relevant sex: Female     Is Non-Hispanic African American: No     Diabetic: No     Tobacco smoker: No     Systolic Blood Pressure: 120 mmHg     Is BP treated: No     HDL Cholesterol: 68 mg/dL     Total Cholesterol: 219 mg/dL

## 2024-02-09 ENCOUNTER — Other Ambulatory Visit: Payer: Self-pay | Admitting: Obstetrics and Gynecology

## 2024-02-11 NOTE — Telephone Encounter (Signed)
 Med refill request:   Alendronate  (FOSAMAX ) 70 MG tablet  Start:  02/14/23 Disp: 12 tablets Refills:  3  Last AEX:  02/14/23 Next AEX:  02/19/24 Last MMG (if hormonal med):  N/A Refill authorized? Please Advise.

## 2024-02-19 ENCOUNTER — Other Ambulatory Visit (HOSPITAL_COMMUNITY)
Admission: RE | Admit: 2024-02-19 | Discharge: 2024-02-19 | Disposition: A | Discharge status: Home / Self Care | Source: Ambulatory Visit | Attending: Obstetrics and Gynecology | Admitting: Obstetrics and Gynecology

## 2024-02-19 ENCOUNTER — Encounter: Payer: Self-pay | Admitting: Obstetrics and Gynecology

## 2024-02-19 ENCOUNTER — Telehealth: Payer: Self-pay | Admitting: Obstetrics and Gynecology

## 2024-02-19 ENCOUNTER — Ambulatory Visit (INDEPENDENT_AMBULATORY_CARE_PROVIDER_SITE_OTHER): Payer: BC Managed Care – PPO | Admitting: Obstetrics and Gynecology

## 2024-02-19 VITALS — BP 128/84 | HR 67 | Ht 62.75 in | Wt 128.0 lb

## 2024-02-19 DIAGNOSIS — Z01419 Encounter for gynecological examination (general) (routine) without abnormal findings: Secondary | ICD-10-CM

## 2024-02-19 DIAGNOSIS — Z87410 Personal history of cervical dysplasia: Secondary | ICD-10-CM

## 2024-02-19 DIAGNOSIS — Z91B Personal risk factor of exposure to diethylstilbestrol: Secondary | ICD-10-CM | POA: Diagnosis present

## 2024-02-19 DIAGNOSIS — M810 Age-related osteoporosis without current pathological fracture: Secondary | ICD-10-CM | POA: Diagnosis not present

## 2024-02-19 DIAGNOSIS — Z1331 Encounter for screening for depression: Secondary | ICD-10-CM | POA: Diagnosis not present

## 2024-02-19 DIAGNOSIS — Z853 Personal history of malignant neoplasm of breast: Secondary | ICD-10-CM

## 2024-02-19 DIAGNOSIS — Z124 Encounter for screening for malignant neoplasm of cervix: Secondary | ICD-10-CM | POA: Diagnosis present

## 2024-02-19 MED ORDER — ALENDRONATE SODIUM 70 MG PO TABS: 70.0000 mg | ORAL_TABLET | ORAL | 3 refills | Status: AC

## 2024-02-19 MED ORDER — VALACYCLOVIR HCL 500 MG PO TABS: 500.0000 mg | ORAL_TABLET | Freq: Two times a day (BID) | ORAL | 3 refills | Status: AC

## 2024-02-19 NOTE — Telephone Encounter (Signed)
 Referral placed and authorized.   Routing to [employer].   Encounter closed.

## 2024-02-19 NOTE — Telephone Encounter (Signed)
 Please place referral to Dr. Odean, medical oncology.   Patient has a hx of breast cancer and would like to do a consultation.
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
## 2024-02-19 NOTE — Patient Instructions (Signed)

## 2024-02-20 ENCOUNTER — Encounter: Payer: Self-pay | Admitting: Obstetrics and Gynecology

## 2024-02-22 LAB — CYTOLOGY - PAP
Adequacy: ABSENT
Diagnosis: NEGATIVE
Diagnosis: REACTIVE

## 2024-02-25 ENCOUNTER — Ambulatory Visit: Payer: Self-pay | Admitting: Obstetrics and Gynecology

## 2024-03-10 ENCOUNTER — Inpatient Hospital Stay

## 2024-03-10 ENCOUNTER — Inpatient Hospital Stay: Attending: Hematology and Oncology | Admitting: Hematology and Oncology

## 2024-03-10 ENCOUNTER — Encounter: Payer: Self-pay | Admitting: Obstetrics and Gynecology

## 2024-03-10 VITALS — BP 148/85 | HR 65 | Temp 97.7°F | Resp 17 | Ht 62.75 in | Wt 132.0 lb

## 2024-03-10 DIAGNOSIS — N898 Other specified noninflammatory disorders of vagina: Secondary | ICD-10-CM | POA: Insufficient documentation

## 2024-03-10 DIAGNOSIS — Z853 Personal history of malignant neoplasm of breast: Secondary | ICD-10-CM | POA: Insufficient documentation

## 2024-03-10 DIAGNOSIS — Z9221 Personal history of antineoplastic chemotherapy: Secondary | ICD-10-CM | POA: Insufficient documentation

## 2024-03-10 DIAGNOSIS — Z9882 Breast implant status: Secondary | ICD-10-CM | POA: Insufficient documentation

## 2024-03-10 DIAGNOSIS — N951 Menopausal and female climacteric states: Secondary | ICD-10-CM | POA: Insufficient documentation

## 2024-03-10 DIAGNOSIS — Z9013 Acquired absence of bilateral breasts and nipples: Secondary | ICD-10-CM | POA: Diagnosis not present

## 2024-03-10 DIAGNOSIS — Z170 Estrogen receptor positive status [ER+]: Secondary | ICD-10-CM

## 2024-03-10 DIAGNOSIS — Z803 Family history of malignant neoplasm of breast: Secondary | ICD-10-CM | POA: Insufficient documentation

## 2024-03-10 NOTE — Progress Notes (Signed)
 "  Patient Care Team: Liana Fish, NP as PCP - General (Nurse Practitioner) Loreli Montie RAMAN, RN as Registered Nurse (Oncology) Fernand Sigrid HERO, MD (Internal Medicine) Cathlyn JAYSON Nikki Bobie FORBES, MD as Consulting Physician (Obstetrics and Gynecology)  DIAGNOSIS:  Encounter Diagnosis  Name Primary?   Malignant neoplasm of upper-outer quadrant of left breast in female, estrogen receptor positive (HCC) Yes    SUMMARY OF ONCOLOGIC HISTORY: Oncology History  Breast cancer of upper-outer quadrant of left female breast (HCC)  2006 Initial Diagnosis   Left breast IDC with DCIS (2 lumpectomies) reexcision: Persistent DCIS, ER/PR positive, bilateral mastectomies 07/14/2004: Left mastectomy: Residual DCIS 0.9 cm, right breast: Benign status post adjuvant chemotherapy x 4 cycles   04/14/2019 Genetic Testing   Negative genetic testing:  No pathogenic variants detected on the [employer] CancerNext-Expanded + RNAinsight panel. The report date is 04/14/2019.  The CancerNext-Expanded + RNAinsight gene panel offered by [employer] and includes sequencing and rearrangement analysis for the following 77 genes: AIP, ALK, APC*, ATM*, AXIN2, BAP1, BARD1, BLM, BMPR1A, BRCA1*, BRCA2*, BRIP1*, CDC73, CDH1*, CDK4, CDKN1B, CDKN2A, CHEK2*, CTNNA1, DICER1, FANCC, FH, FLCN, GALNT12, KIF1B, LZTR1, MAX, MEN1, MET, MLH1*, MSH2*, MSH3, MSH6*, MUTYH*, NBN, NF1*, NF2, NTHL1, PALB2*, PHOX2B, PMS2*, POT1, PRKAR1A, PTCH1, PTEN*, RAD51C*, RAD51D*, RB1, RECQL, RET, SDHA, SDHAF2, SDHB, SDHC, SDHD, SMAD4, SMARCA4, SMARCB1, SMARCE1, STK11, SUFU, TMEM127, TP53*, TSC1, TSC2, VHL and XRCC2 (sequencing and deletion/duplication); EGFR, EGLN1, HOXB13, KIT, MITF, PDGFRA, POLD1 and POLE (sequencing only); EPCAM and GREM1 (deletion/duplication only). DNA and RNA analyses performed for * genes.      CHIEF COMPLIANT: History of breast cancer, consultation regarding estrogen replacement therapy  HISTORY OF PRESENT ILLNESS:  History of Present  Illness Regina Schultz is a 59 year old female with estrogen receptor positive left breast cancer who presents for long-term oncology follow-up and management of menopausal symptoms.  She was diagnosed with left breast cancer in 2006 and treated with two left lumpectomies for ductal carcinoma in situ and invasive carcinoma, re-excision for margins, and subsequent bilateral mastectomy. Final pathology showed additional DCIS with negative lymph nodes. She received four cycles of adjuvant chemotherapy, likely including Adriamycin, with no chronic complications. She had immediate implant-based reconstruction with an early postoperative infection requiring hospitalization, and has since had nearly 20 years of stable, asymptomatic implants without changes in shape. She has not had mammograms post-mastectomy. She previously had annual breast MRIs that were stopped after oncology follow-up ended, and her gynecologist now performs annual breast exams.  Her main concern today is menopausal vaginal dryness. She is unsure about the safety of estrogen therapy given her estrogen receptor positive breast cancer. Her gynecologist advised against systemic estrogen but felt topical vaginal estrogen might be acceptable. She has not started treatment and requests oncology guidance on the safety and efficacy of topical vaginal estrogen. She has no other menopausal or genitourinary complaints.  She also asks about the need for preemptive replacement of her breast implants. She has no pain, deformity, or other symptoms of implant complications and was counseled that routine replacement is not indicated without symptoms.  She is concerned about breast cancer risk in her two daughters, given her own diagnosis, her mother's breast cancer, and multiple affected cousins on both sides of the family. She had genetic testing twice, most recently a 77-gene panel in 2021, which was negative for known hereditary breast cancer mutations. She  seeks oncology recommendations for her daughters' risk assessment and screening.     ALLERGIES:  has no known allergies.  MEDICATIONS:  Current Outpatient Medications  Medication Sig Dispense Refill   alendronate  (FOSAMAX ) 70 MG tablet Take 1 tablet (70 mg total) by mouth once a week. Take with a full glass of water on an empty stomach. 12 tablet 3   buPROPion  (WELLBUTRIN  XL) 300 MG 24 hr tablet Take 1 tablet (300 mg total) by mouth daily. 90 tablet 3   valACYclovir  (VALTREX ) 500 MG tablet Take 1 tablet (500 mg total) by mouth 2 (two) times daily. Take twice a day for 3 days for an outbreak. 30 tablet 3   VITAMIN D  PO Take by mouth.     No current facility-administered medications for this visit.    PHYSICAL EXAMINATION: ECOG PERFORMANCE STATUS: 1 - Symptomatic but completely ambulatory  Vitals:   03/10/24 1523 03/10/24 1527  BP: (!) 158/91 (!) 148/85  Pulse: 65   Resp: 17   Temp: 97.7 F (36.5 C)   SpO2: 100%    Filed Weights   03/10/24 1523  Weight: 132 lb (59.9 kg)    Physical Exam   (exam performed in the presence of a chaperone)  LABORATORY DATA:  I have reviewed the data as listed    Latest Ref Rng & Units 01/17/2024    8:31 AM 10/25/2022    8:12 AM 10/31/2021    8:17 AM  CMP  Glucose 70 - 99 mg/dL 85  887  98   BUN 6 - 24 mg/dL 16  10  11    Creatinine 0.57 - 1.00 mg/dL 9.33  9.32  9.20   Sodium 134 - 144 mmol/L 140  137  141   Potassium 3.5 - 5.2 mmol/L 4.7  4.5  4.9   Chloride 96 - 106 mmol/L 101  97  101   CO2 20 - 29 mmol/L 23  26  24    Calcium 8.7 - 10.2 mg/dL 9.2  9.9  9.7   Total Protein 6.0 - 8.5 g/dL 7.0  7.3  7.3   Total Bilirubin 0.0 - 1.2 mg/dL 0.3  0.6  0.7   Alkaline Phos 49 - 135 IU/L 85  107  95   AST 0 - 40 IU/L 40  18  19   ALT 0 - 32 IU/L 27  18  14      Lab Results  Component Value Date   WBC 6.2 01/17/2024   HGB 13.7 01/17/2024   HCT 41.5 01/17/2024   MCV 95 01/17/2024   PLT 325 01/17/2024   NEUTROABS 3.3 01/17/2024     ASSESSMENT & PLAN:  Breast cancer of upper-outer quadrant of left female breast (HCC) 2006: Left breast IDC with DCIS (2 lumpectomies) reexcision: Persistent DCIS, ER/PR positive, bilateral mastectomies 07/14/2004: Left mastectomy: Residual DCIS 0.9 cm, right breast: Benign status post adjuvant chemotherapy x 4 cycles  Assessment & Plan History of estrogen receptor positive left breast cancer status post bilateral mastectomy and reconstruction Disease-free 20 years post-treatment. Negative genetic testing for hereditary breast cancer mutations. No recent breast imaging; annual clinical exams suffice. Daughters at increased risk due to family history. - Reassured no further breast imaging unless symptoms or implant concerns arise. - Recommended discontinuation of routine oncology follow-up unless new symptoms develop. - Advised daughters to start annual mammography and risk scoring by gynecologists for early screening eligibility. - Sent correspondence to gynecologist Dr. Abby regarding recommendations and family history.  Menopausal symptoms Reports vaginal dryness. Interested in topical vaginal estrogen, which is safe for breast cancer survivors and does not increase risk. - Reassured  safety of topical vaginal estrogen cream for breast cancer survivors. - Sent message to gynecologist Dr. Nikki to coordinate prescription and management of vaginal estrogen cream.  Breast implant status post-mastectomy Implants in place for nearly 20 years without complications. Concerned about implant longevity; routine replacement not indicated without symptoms. - Reassured routine replacement of implants not necessary without symptoms or concerns. - Advised MRI or imaging only if symptoms like discomfort or changes in implant shape develop.      No orders of the defined types were placed in this encounter.  The patient has a good understanding of the overall plan. she agrees with it. she will  call with any problems that may develop before the next visit here.  I personally spent a total of 30 minutes in the care of the patient today including preparing to see the patient, getting/reviewing separately obtained history, performing a medically appropriate exam/evaluation, counseling and educating, placing orders, referring and communicating with other health care professionals, documenting clinical information in the EHR, independently interpreting results, communicating results, and coordinating care.   Viinay K Ellesse Antenucci, MD 03/10/2024    "

## 2024-03-10 NOTE — Assessment & Plan Note (Signed)
 2006: Left breast IDC with DCIS (2 lumpectomies) reexcision: Persistent DCIS, ER/PR positive, bilateral mastectomies 07/14/2004: Left mastectomy: Residual DCIS 0.9 cm, right breast: Benign status post adjuvant chemotherapy x 4 cycles  Difficult to ascertain if the lymph node was positive.  One of the surgical notes claims that the cytokeratin was positive in the lymph node.

## 2024-03-12 ENCOUNTER — Other Ambulatory Visit: Payer: Self-pay | Admitting: Obstetrics and Gynecology

## 2024-03-12 MED ORDER — ESTRADIOL 10 MCG VA TABS: 1.0000 | ORAL_TABLET | VAGINAL | 3 refills | Status: AC

## 2024-04-28 ENCOUNTER — Ambulatory Visit: Admitting: Nurse Practitioner

## 2024-06-24 ENCOUNTER — Ambulatory Visit: Admitting: Nurse Practitioner

## 2024-12-25 ENCOUNTER — Encounter: Admitting: Nurse Practitioner

## 2025-02-19 ENCOUNTER — Ambulatory Visit: Admitting: Obstetrics and Gynecology
